# Patient Record
Sex: Male | Born: 1945
Health system: Southern US, Community
[De-identification: ages and names within clinical notes are randomized; demographics above are authoritative.]

## PROBLEM LIST (undated history)

## (undated) DIAGNOSIS — K760 Fatty (change of) liver, not elsewhere classified: Secondary | ICD-10-CM

## (undated) DIAGNOSIS — M722 Plantar fascial fibromatosis: Secondary | ICD-10-CM

## (undated) DIAGNOSIS — I10 Essential (primary) hypertension: Secondary | ICD-10-CM

## (undated) DIAGNOSIS — R748 Abnormal levels of other serum enzymes: Secondary | ICD-10-CM

## (undated) DIAGNOSIS — K8020 Calculus of gallbladder without cholecystitis without obstruction: Secondary | ICD-10-CM

## (undated) DIAGNOSIS — N189 Chronic kidney disease, unspecified: Secondary | ICD-10-CM

## (undated) DIAGNOSIS — E213 Hyperparathyroidism, unspecified: Secondary | ICD-10-CM

## (undated) DIAGNOSIS — G5600 Carpal tunnel syndrome, unspecified upper limb: Secondary | ICD-10-CM

## (undated) DIAGNOSIS — E785 Hyperlipidemia, unspecified: Secondary | ICD-10-CM

## (undated) DIAGNOSIS — I5022 Chronic systolic (congestive) heart failure: Secondary | ICD-10-CM

## (undated) DIAGNOSIS — R7303 Prediabetes: Secondary | ICD-10-CM

## (undated) DIAGNOSIS — I2510 Atherosclerotic heart disease of native coronary artery without angina pectoris: Secondary | ICD-10-CM

## (undated) DIAGNOSIS — I5032 Chronic diastolic (congestive) heart failure: Secondary | ICD-10-CM

## (undated) DIAGNOSIS — K469 Unspecified abdominal hernia without obstruction or gangrene: Secondary | ICD-10-CM

## (undated) DIAGNOSIS — I739 Peripheral vascular disease, unspecified: Secondary | ICD-10-CM

## (undated) DIAGNOSIS — N419 Inflammatory disease of prostate, unspecified: Secondary | ICD-10-CM

## (undated) HISTORY — DX: Carpal tunnel syndrome, unspecified upper limb: G56.00

## (undated) HISTORY — DX: Plantar fascial fibromatosis: M72.2

## (undated) HISTORY — DX: Inflammatory disease of prostate, unspecified: N41.9

## (undated) HISTORY — DX: Chronic diastolic (congestive) heart failure: I50.32

## (undated) HISTORY — DX: Atherosclerotic heart disease of native coronary artery without angina pectoris: I25.10

## (undated) HISTORY — PX: CARPAL TUNNEL RELEASE: SHX101

## (undated) HISTORY — DX: Fatty (change of) liver, not elsewhere classified: K76.0

## (undated) HISTORY — DX: Calculus of gallbladder without cholecystitis without obstruction: K80.20

## (undated) HISTORY — DX: Hyperlipidemia, unspecified: E78.5

## (undated) HISTORY — DX: Essential (primary) hypertension: I10

## (undated) HISTORY — PX: MANDIBLE SURGERY: SHX707

## (undated) HISTORY — DX: Unspecified abdominal hernia without obstruction or gangrene: K46.9

## (undated) HISTORY — PX: HERNIA REPAIR: SHX51

## (undated) HISTORY — DX: Abnormal levels of other serum enzymes: R74.8

## (undated) HISTORY — DX: Hyperparathyroidism, unspecified: E21.3

---

## 1991-06-28 HISTORY — PX: CORONARY ARTERY BYPASS GRAFT: SHX141

## 1998-12-01 ENCOUNTER — Ambulatory Visit (HOSPITAL_BASED_OUTPATIENT_CLINIC_OR_DEPARTMENT_OTHER): Admission: RE | Admit: 1998-12-01 | Discharge: 1998-12-01 | Payer: Self-pay | Admitting: General Surgery

## 2000-06-27 HISTORY — PX: OTHER SURGICAL HISTORY: SHX169

## 2001-06-06 ENCOUNTER — Encounter: Payer: Self-pay | Admitting: Emergency Medicine

## 2001-06-06 ENCOUNTER — Inpatient Hospital Stay (HOSPITAL_COMMUNITY): Admission: EM | Admit: 2001-06-06 | Discharge: 2001-06-08 | Payer: Self-pay | Admitting: Emergency Medicine

## 2001-07-10 ENCOUNTER — Encounter (HOSPITAL_COMMUNITY): Admission: RE | Admit: 2001-07-10 | Discharge: 2001-10-08 | Payer: Self-pay | Admitting: *Deleted

## 2008-06-02 ENCOUNTER — Encounter: Admission: RE | Admit: 2008-06-02 | Discharge: 2008-06-02 | Payer: Self-pay | Admitting: Orthopedic Surgery

## 2008-06-05 ENCOUNTER — Ambulatory Visit (HOSPITAL_BASED_OUTPATIENT_CLINIC_OR_DEPARTMENT_OTHER): Admission: RE | Admit: 2008-06-05 | Discharge: 2008-06-05 | Payer: Self-pay | Admitting: Orthopedic Surgery

## 2008-07-23 ENCOUNTER — Encounter: Admission: RE | Admit: 2008-07-23 | Discharge: 2008-08-18 | Payer: Self-pay | Admitting: Neurology

## 2008-11-25 ENCOUNTER — Ambulatory Visit (HOSPITAL_BASED_OUTPATIENT_CLINIC_OR_DEPARTMENT_OTHER): Admission: RE | Admit: 2008-11-25 | Discharge: 2008-11-25 | Payer: Self-pay | Admitting: Orthopedic Surgery

## 2010-07-12 ENCOUNTER — Emergency Department (HOSPITAL_COMMUNITY)
Admission: EM | Admit: 2010-07-12 | Discharge: 2010-07-12 | Payer: Self-pay | Source: Home / Self Care | Admitting: Emergency Medicine

## 2010-07-14 LAB — BASIC METABOLIC PANEL
BUN: 10 mg/dL (ref 6–23)
CO2: 26 mEq/L (ref 19–32)
Calcium: 10.5 mg/dL (ref 8.4–10.5)
Chloride: 105 mEq/L (ref 96–112)
Creatinine, Ser: 1.02 mg/dL (ref 0.4–1.5)
GFR calc Af Amer: >60 mL/min (ref >60)
GFR calc non Af Amer: >60 mL/min (ref >60)
Glucose, Bld: 118 mg/dL — ABNORMAL HIGH (ref 70–99)
Potassium: 4.2 mEq/L (ref 3.5–5.1)
Sodium: 138 mEq/L (ref 135–145)

## 2010-07-14 LAB — DIFFERENTIAL
Basophils Absolute: 0 10*3/uL (ref 0.0–0.1)
Basophils Relative: 1 % (ref 0–1)
Eosinophils Absolute: 0.4 10*3/uL (ref 0.0–0.7)
Eosinophils Relative: 7 % — ABNORMAL HIGH (ref 0–5)
Lymphocytes Relative: 32 % (ref 12–46)
Lymphs Abs: 1.6 10*3/uL (ref 0.7–4.0)
Monocytes Absolute: 1 10*3/uL (ref 0.1–1.0)
Monocytes Relative: 20 % — ABNORMAL HIGH (ref 3–12)
Neutro Abs: 2 10*3/uL (ref 1.7–7.7)
Neutrophils Relative %: 40 % — ABNORMAL LOW (ref 43–77)

## 2010-07-14 LAB — TROPONIN I: Troponin I: 0.01 ng/mL (ref 0.00–0.06)

## 2010-07-14 LAB — POCT CARDIAC MARKERS
CKMB, poc: 8.7 ng/mL (ref 1.0–8.0)
CKMB, poc: 9.2 ng/mL (ref 1.0–8.0)
CKMB, poc: 7.6 ng/mL (ref 1.0–8.0)
Myoglobin, poc: 411 ng/mL (ref 12–200)
Myoglobin, poc: 427 ng/mL (ref 12–200)
Myoglobin, poc: 315 ng/mL (ref 12–200)
Troponin i, poc: <0.05 ng/mL (ref 0.00–0.09)
Troponin i, poc: <0.05 ng/mL (ref 0.00–0.09)
Troponin i, poc: <0.05 ng/mL (ref 0.00–0.09)

## 2010-07-14 LAB — CBC
HCT: 38.9 % — ABNORMAL LOW (ref 39.0–52.0)
Hemoglobin: 13.4 g/dL (ref 13.0–17.0)
MCH: 33.1 pg (ref 26.0–34.0)
MCHC: 34.4 g/dL (ref 30.0–36.0)
MCV: 96 fL (ref 78.0–100.0)
Platelets: 157 10*3/uL (ref 150–400)
RBC: 4.05 MIL/uL — ABNORMAL LOW (ref 4.22–5.81)
RDW: 13.1 % (ref 11.5–15.5)
WBC: 4.9 10*3/uL (ref 4.0–10.5)

## 2010-07-14 LAB — CK TOTAL AND CKMB (NOT AT ARMC)
CK, MB: 14.1 ng/mL (ref 0.3–4.0)
Relative Index: 1.5 (ref 0.0–2.5)
Total CK: 942 U/L — ABNORMAL HIGH (ref 7–232)

## 2010-10-04 LAB — POCT HEMOGLOBIN-HEMACUE: Hemoglobin: 14.8 g/dL (ref 13.0–17.0)

## 2010-10-05 LAB — BASIC METABOLIC PANEL
BUN: 9 mg/dL (ref 6–23)
CO2: 25 mEq/L (ref 19–32)
Calcium: 10.1 mg/dL (ref 8.4–10.5)
Chloride: 105 mEq/L (ref 96–112)
Creatinine, Ser: 0.91 mg/dL (ref 0.4–1.5)
GFR calc Af Amer: >60 mL/min (ref >60)
GFR calc non Af Amer: >60 mL/min (ref >60)
Glucose, Bld: 174 mg/dL — ABNORMAL HIGH (ref 70–99)
Potassium: 3.8 mEq/L (ref 3.5–5.1)
Sodium: 137 mEq/L (ref 135–145)

## 2010-11-09 NOTE — Op Note (Signed)
NAME:  Stanley [name], Stanley [name]               ACCOUNT NO.:  [id number]   MEDICAL RECORD NO.:  [id number]           PATIENT TYPE:   LOCATION:                                 FACILITY:   PHYSICIAN:  Cindee Salt, M.D.       DATE OF BIRTH:  1945/10/03   DATE OF PROCEDURE:  11/25/2008  DATE OF DISCHARGE:                               OPERATIVE REPORT   PREOPERATIVE DIAGNOSIS:  Carpal tunnel syndrome, right hand.   POSTOPERATIVE DIAGNOSIS:  Carpal tunnel syndrome, right hand.   OPERATION:  Decompression of right median nerve.   SURGEON:  Cindee Salt, MD   ASSISTANT:  Carolyne Fiscal, RN   ANESTHESIA:  General.   DATE OF OPERATION:  November 25, 2008   ANESTHESIOLOGIST:  Janetta Hora. Gelene Mink, MD   HISTORY:  The patient is a 65 year old male with a history of carpal  tunnel syndrome.  EMG nerve conductions positive.  This has not  responded to conservative treatment.  He has elected to undergo surgical  decompression.  Pre and postoperative course have been discussed along  with risks and complications.  He is aware that there is no guarantee  with surgery, possibility of infection, recurrence of injury to  arteries, nerves, tendons, incomplete relief of symptoms, dystrophy.  In  the preoperative area, the patient is seen.  The extremity marked by  both the patient and surgeon.  Antibiotic given.   PROCEDURE:  The patient is brought to the operating room where a general  anesthetic was carried out without difficulty under the direction of Dr.  Gelene Mink.  He was prepped using ChloraPrep, supine position, right arm  free.  A 3-minute dry time was allowed.  A time-out taken confirming the  patient, procedure, he was then draped.  The limb was exsanguinated with  an Esmarch bandage.  Tourniquet placed high and the arm was inflated to  250 mmHg.  A longitudinal incision was made in the palm, carried down  through subcutaneous tissue.  Bleeders were electrocauterized.  Palmar  fascia was split, superficial  palmar arch identified, flexor tendon to  the ring little finger identified to the ulnar side of median nerve.  The carpal retinaculum was incised with sharp dissection.  Right angle  and Sewall retractor were placed between skin and forearm fascia.  The  fascia was released for approximately a centimeter and a half proximal  to the wrist crease under direct vision.  Canal was explored.  Air  compression to the nerve was apparent.  No further lesions were  identified.  The wound was copiously irrigated with saline.  The skin  was then closed with interrupted 5-0 Vicryl Rapide sutures.  Local  infiltration was then given with 0.25% Marcaine  without epinephrine.  A sterile compressive dressing and splint to the  wrist was applied.  The patient tolerated the procedure well and was  taken to the recovery room for observation in satisfactory condition.  He will be discharged home to return to the [facility] in  1 week on Vicodin.  ______________________________  Cindee Salt, M.D.     GK/MEDQ  D:  11/25/2008  T:  11/25/2008  Job:  [id number]   cc:   Dr. Tenny Craw

## 2010-11-09 NOTE — Op Note (Signed)
NAMEBERNARDO, [name]               ACCOUNT NO.:  [id number]   MEDICAL RECORD NO.:  [id number]          PATIENT TYPE:  AMB   LOCATION:  DSC                          FACILITY:  [facility]   PHYSICIAN:  Cindee Salt, M.D.       DATE OF BIRTH:  [birth year]   DATE OF PROCEDURE:  06/05/2008  DATE OF DISCHARGE:                               OPERATIVE REPORT   PREOPERATIVE DIAGNOSIS:  Carpal tunnel syndrome, left hand.   POSTOPERATIVE DIAGNOSIS:  Carpal tunnel syndrome, left hand.   OPERATION:  Decompression of left median nerve.   SURGEON:  Cindee Salt, MD   ASSISTANT:  Carolyne Fiscal, RN   ANESTHESIA:  Forearm-based IV regional.   ANESTHESIOLOGIST:  Zenon Mayo, MD.   HISTORY:  The patient is a 65 year old male with a history of carpal  tunnel syndrome, EMG nerve conductions positive which has not responded  to conservative treatment.  He has elected to undergo surgical  decompression.  Pre, peri, and postoperative course has been discussed  along with risks and complications.  He is aware there is no guarantee  with surgery, possibility of infection, recurrence of injury to  arteries, nerves, and tendons, incomplete relief of symptoms, and  dystrophy.  In the preoperative area, the patient is seen.  The  extremity was marked by both the patient and surgeon.  Antibiotic was  given.   PROCEDURE:  The patient was brought to the operating room where a  forearm-based IV regional anesthetic was carried out without difficulty.  He was prepped using DuraPrep, supine position, left arm free.  A time-  out was taken.  After adequate anesthesia was afforded, a longitudinal  incision was made in the palm and carried down through the subcutaneous  tissue.  Bleeders were electrocauterized.  Palmar fascia was split.  Superficial palmar arch was identified.  The flexor tendon to the ring  and little finger was identified to the ulnar side of the median nerve.  The carpal retinaculum was incised with  sharp dissection.  A right-angle  and Sewall retractor were placed between skin and forearm fascia.  The  fascia was released for approximately a centimeter and half proximal to  the wrist crease under direct vision.  Canal was explored.  Tenosynovial  tissue was moderately thickened.  The area of compression to the nerve  was immediately apparent with an area of hyperemia and hour glass  deformity.  No further lesions were identified.  The wound was copiously  irrigated with saline and skin was closed with interrupted 4-0 Vicryl  Rapide sutures.  A sterile compressive dressing and wrist splint was  applied with the fingers free.  On  deflation of the tourniquet, all fingers immediately pinked.  He was  taken to the recovery room for observation in satisfactory condition.  He would be discharged home to return to the [facility] [place]  in 1 week on Vicodin.           ______________________________  Cindee Salt, M.D.     GK/MEDQ  D:  06/05/2008  T:  06/05/2008  Job:  [id number]  cc:   C. Duane Lope, M.D.

## 2010-11-12 NOTE — Procedures (Signed)
[place]. [facility]  Patient:    Stanley [name], Stanley [name] Visit Number: [id number] MRN: [id number]          Service Type: MED Location: [phone] Attending Physician:  Mora Appl Dictated by:   Darci Needle, M.D. Admit Date:  06/06/2001   CC:         Dellis Anes. Idell Pickles, M.D., [facility]  Meade Maw, M.D.  Cardiac Catheterization Lab   Procedure Report  DATE OF BIRTH:  February 03, 1946.  INDICATION FOR PROCEDURE:  The patient presented with atypical chest pain and nondiagnostic cardiac markers of injury.  A catheterization today was demonstrated to have a high-grade mid right coronary stenosis and percutaneous intervention on this region was felt to be indicated.  PROCEDURE PERFORMED: 1. Percutaneous transluminal coronary angioplasty, mid right coronary artery. 2. Stent, mid right coronary artery.  DESCRIPTION OF PROCEDURE:  After informed consent using the 6-French sheath placed earlier by Dr. Fraser Din, percutaneous coronary intervention was performed.  A total of 3000 units of IV heparin was administered.  ACT was documented to be greater than 250.  Double bolus, followed by an infusion, of Integrilin was given.  A BMW wire, a 6-French vented JR guide catheter, and a 2.5-mm diameter, 15-mm long Maverick balloon was used.  We then stented using a Pixel 13-mm long, 2.5-mm diameter stent to 12 atmospheres which was an effective diameter of 2.6 mm.  The final angiographic result was quite nice without any evidence of complication.  The proximal portion of the RCA contained less than 50% narrowing.  TIMI flow was grade 3 post procedure.  ACT post procedure was 264.  CONCLUSIONS:  Successful percutaneous intervention on the right coronary with reduction in mid vessel 85% stenosis to 0% with stenting and establishment of TIMI grade 3 flow.  PLAN:  Aspirin.  Plavix x three weeks.  Integrilin x 18 hours.  Aggressive risk  factor modification.Dictated by:   Darci Needle, M.D. Attending Physician:  Mora Appl DD:  06/07/01 TD:  06/07/01 Job: [id number]
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
## 2010-11-12 NOTE — Discharge Summary (Signed)
[place]. [facility]  Patient:    Stanley [name], Stanley [name] Visit Number: [id number] MRN: [id number]          Service Type: MED Location: [phone] Attending Physician:  Meade Maw A Dictated by:   Anselm Lis, N.P. Admit Date:  06/06/2001 Discharge Date: 06/08/2001                             Discharge Summary  CONSULTANTS:  Dr. Verdis Prime.  PRIMARY CARE Rossi Burdo:  Dr. Foye Deer.  PROCEDURES: 1. (June 06, 2001) diagnostic coronary angiography by    Dr. Hillary Bow approximately 80-85% mid RCA, 60% CFX (following    OM-2) and approximately 70% ostial OM-1.  Left ventriculogram:  EF 65%    without regional wall motion abnormalities, negative MR. 2. (June 07, 2001) by Dr. Katrinka Blazing:  PTCA/stent, estimated RCA with    obstructive stenosis from 85% to 0%.  DISCHARGE DIAGNOSES: 1. Coronary atherosclerotic heart disease.    a. (June 07, 2001) PTCA/stent RCA.    b. Elevated CK MB without elevation in troponin I.  I suspect falsely       positive versus chronic elevation. 2. Dyslipidemia:  Inadequate control on Welchol. Dictated by:   Anselm Lis, N.P. Attending Physician:  Mora Appl DD:  06/19/01 TD:  06/21/01 Job: [id number]

## 2010-11-12 NOTE — Cardiovascular Report (Signed)
[place]. [facility]  Patient:    Stanley [name], TROOP Visit Number: [id number] MRN: [id number]          Service Type: MED Location: [phone] Attending Physician:  Mora Appl Dictated by:   Meade Maw, M.D. Admit Date:  06/06/2001                          Cardiac Catheterization  REFERRING PHYSICIAN:  Jamesetta Geralds, M.D.  INDICATION FOR PROCEDURE:  Chest pain with positive cardiac enzymes.  DESCRIPTION OF PROCEDURE:  After obtaining written informed consent, the patient was brought to the cardiac catheterization lab in the postabsorptive state.  Preoperative sedation was achieved using IV Versed.  Local anesthesia was achieved using 1% Xylocaine.  A 6-French hemostasis sheath was placed into the right femoral artery using the modified Seldinger technique.  Selective coronary angiography was performed using a JL4 and a 5-French Williams JR catheter.  Multiple views were obtained.  All catheter exchanges were made over a guidewire.  The hemostasis sheath was flushed following each engagement.  Single plane ventriculogram was performed in the RAO position using a 6-French pigtail curved catheter.  FINDINGS:  The aortic pressure is 99/62.  LV pressure is 99/7.  There was no gradient noted on pullback.  There was no mitral regurgitation noted.  Normal ventriculogram, ejection fraction of approximately 60%.  Coronary angiography: 1. The left main coronary artery bifurcates into the left anterior descending    and circumflex vessel.  There was no significant disease in the left main    coronary artery. 2. Left anterior descending:  The left anterior descending gives rise to a    small D-1, small to moderate D-2, and ends as an apical recurrent branch.    The flow in the left anterior descending was noted to be slow but there    were no obstructive lesions noted. 3. Circumflex vessel:  The circumflex vessel is a moderate to large sized    vessel  giving rise to a small to moderate OM-1 and a large bifurcating    OM-2.  There is an ostial 70% lesion in the first OM-1.  Following the    large bifurcating OM-2, there is a 60% lesion noted in the distal    circumflex. 4. Right coronary artery:  The right coronary artery is a small to moderate    sized vessel.  It was noted to have a 40% proximal lesion followed by a    more 60-70% distal lesion.  There was a 30-40% lesion noted prior to the    PDA.  IMPRESSION: 1. Critical disease involving the first obtuse marginal with 60% lesion    following the second obtuse marginal in the circumflex. 2. Borderline critical disease in the mid right coronary artery. 3. Slow flow noted in the left anterior descending but no obstructive lesions    noted. 4. Preserved left ventricular function.  RECOMMENDATIONS:  The film will be reviewed with Dr. Katrinka Blazing.  Further intervention pending his review. Dictated by:   Meade Maw, M.D. Attending Physician:  Mora Appl DD:  06/07/01 TD:  06/07/01 Job: [id number]

## 2011-01-24 ENCOUNTER — Emergency Department (HOSPITAL_COMMUNITY): Payer: Medicare Other

## 2011-01-24 ENCOUNTER — Inpatient Hospital Stay (HOSPITAL_COMMUNITY)
Admission: EM | Admit: 2011-01-24 | Discharge: 2011-01-26 | DRG: 313 | Disposition: A | Discharge status: Home / Self Care | Payer: Medicare Other | Attending: Internal Medicine | Admitting: Internal Medicine

## 2011-01-24 DIAGNOSIS — E785 Hyperlipidemia, unspecified: Secondary | ICD-10-CM | POA: Diagnosis present

## 2011-01-24 DIAGNOSIS — I2510 Atherosclerotic heart disease of native coronary artery without angina pectoris: Secondary | ICD-10-CM | POA: Diagnosis present

## 2011-01-24 DIAGNOSIS — R0789 Other chest pain: Principal | ICD-10-CM | POA: Diagnosis present

## 2011-01-24 DIAGNOSIS — R079 Chest pain, unspecified: Secondary | ICD-10-CM

## 2011-01-24 DIAGNOSIS — I10 Essential (primary) hypertension: Secondary | ICD-10-CM | POA: Diagnosis present

## 2011-01-24 DIAGNOSIS — Z7982 Long term (current) use of aspirin: Secondary | ICD-10-CM

## 2011-01-24 DIAGNOSIS — K219 Gastro-esophageal reflux disease without esophagitis: Secondary | ICD-10-CM | POA: Diagnosis present

## 2011-01-24 DIAGNOSIS — K8020 Calculus of gallbladder without cholecystitis without obstruction: Secondary | ICD-10-CM | POA: Diagnosis present

## 2011-01-24 DIAGNOSIS — E210 Primary hyperparathyroidism: Secondary | ICD-10-CM | POA: Diagnosis present

## 2011-01-24 DIAGNOSIS — K7689 Other specified diseases of liver: Secondary | ICD-10-CM | POA: Diagnosis present

## 2011-01-24 LAB — DIFFERENTIAL
Basophils Absolute: 0 10*3/uL (ref 0.0–0.1)
Basophils Relative: 1 % (ref 0–1)
Eosinophils Absolute: 0.2 10*3/uL (ref 0.0–0.7)
Eosinophils Relative: 3 % (ref 0–5)
Lymphocytes Relative: 34 % (ref 12–46)
Lymphs Abs: 1.7 10*3/uL (ref 0.7–4.0)
Monocytes Absolute: 0.7 10*3/uL (ref 0.1–1.0)
Monocytes Relative: 14 % — ABNORMAL HIGH (ref 3–12)
Neutro Abs: 2.3 10*3/uL (ref 1.7–7.7)
Neutrophils Relative %: 48 % (ref 43–77)

## 2011-01-24 LAB — CBC
HCT: 38.6 % — ABNORMAL LOW (ref 39.0–52.0)
Hemoglobin: 14.1 g/dL (ref 13.0–17.0)
MCH: 34.2 pg — ABNORMAL HIGH (ref 26.0–34.0)
MCHC: 36.5 g/dL — ABNORMAL HIGH (ref 30.0–36.0)
MCV: 93.7 fL (ref 78.0–100.0)
Platelets: 161 10*3/uL (ref 150–400)
RBC: 4.12 MIL/uL — ABNORMAL LOW (ref 4.22–5.81)
RDW: 12.9 % (ref 11.5–15.5)
WBC: 4.8 10*3/uL (ref 4.0–10.5)

## 2011-01-24 LAB — CK TOTAL AND CKMB (NOT AT ARMC)
CK, MB: 13.5 ng/mL (ref 0.3–4.0)
CK, MB: 11.7 ng/mL (ref 0.3–4.0)
Relative Index: 1.1 (ref 0.0–2.5)
Relative Index: 1.1 (ref 0.0–2.5)
Total CK: 1234 U/L — ABNORMAL HIGH (ref 7–232)
Total CK: 1104 U/L — ABNORMAL HIGH (ref 7–232)

## 2011-01-24 LAB — COMPREHENSIVE METABOLIC PANEL
ALT: 51 U/L (ref 0–53)
AST: 54 U/L — ABNORMAL HIGH (ref 0–37)
Albumin: 4.2 g/dL (ref 3.5–5.2)
Alkaline Phosphatase: 70 U/L (ref 39–117)
BUN: 13 mg/dL (ref 6–23)
CO2: 26 mEq/L (ref 19–32)
Calcium: 11 mg/dL — ABNORMAL HIGH (ref 8.4–10.5)
Chloride: 103 mEq/L (ref 96–112)
Creatinine, Ser: 0.73 mg/dL (ref 0.50–1.35)
GFR calc Af Amer: >60 mL/min (ref >60)
GFR calc non Af Amer: >60 mL/min (ref >60)
Glucose, Bld: 143 mg/dL — ABNORMAL HIGH (ref 70–99)
Potassium: 3.7 mEq/L (ref 3.5–5.1)
Sodium: 138 mEq/L (ref 135–145)
Total Bilirubin: 2 mg/dL — ABNORMAL HIGH (ref 0.3–1.2)
Total Protein: 7.3 g/dL (ref 6.0–8.3)

## 2011-01-24 LAB — TROPONIN I
Troponin I: <0.3 ng/mL (ref <0.30)
Troponin I: <0.3 ng/mL (ref <0.30)

## 2011-01-25 ENCOUNTER — Emergency Department (HOSPITAL_COMMUNITY): Payer: Medicare Other

## 2011-01-25 ENCOUNTER — Inpatient Hospital Stay (HOSPITAL_COMMUNITY): Payer: Medicare Other

## 2011-01-25 LAB — CARDIAC PANEL(CRET KIN+CKTOT+MB+TROPI)
CK, MB: 8.5 ng/mL (ref 0.3–4.0)
Relative Index: 1.1 (ref 0.0–2.5)
Total CK: 757 U/L — ABNORMAL HIGH (ref 7–232)
Troponin I: <0.3 ng/mL (ref <0.30)

## 2011-01-25 LAB — BILIRUBIN, FRACTIONATED(TOT/DIR/INDIR)
Bilirubin, Direct: 0.2 mg/dL (ref 0.0–0.3)
Indirect Bilirubin: 1.7 mg/dL — ABNORMAL HIGH (ref 0.3–0.9)
Total Bilirubin: 1.9 mg/dL — ABNORMAL HIGH (ref 0.3–1.2)

## 2011-01-25 LAB — SEDIMENTATION RATE: Sed Rate: 6 mm/hr (ref 0–16)

## 2011-01-25 LAB — LIPID PANEL
LDL Cholesterol: 103 mg/dL — ABNORMAL HIGH (ref 0–99)
VLDL: 30 mg/dL (ref 0–40)

## 2011-01-25 MED ORDER — TECHNETIUM TC 99M TETROFOSMIN IV KIT
30.0000 | PACK | Freq: Once | INTRAVENOUS | Status: AC | PRN
  Administered 2011-01-25: 30 via INTRAVENOUS

## 2011-01-25 MED ORDER — TECHNETIUM TC 99M TETROFOSMIN IV KIT
10.0000 | PACK | Freq: Once | INTRAVENOUS | Status: AC | PRN
  Administered 2011-01-25: 10 via INTRAVENOUS

## 2011-01-26 ENCOUNTER — Other Ambulatory Visit (HOSPITAL_COMMUNITY): Payer: 59

## 2011-01-26 DIAGNOSIS — K8020 Calculus of gallbladder without cholecystitis without obstruction: Secondary | ICD-10-CM

## 2011-01-26 DIAGNOSIS — R079 Chest pain, unspecified: Secondary | ICD-10-CM

## 2011-01-26 LAB — PTH, INTACT AND CALCIUM: PTH: 145.8 pg/mL — ABNORMAL HIGH (ref 14.0–72.0)
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
## 2011-02-09 LAB — PTH-RELATED PEPTIDE

## 2011-02-10 ENCOUNTER — Encounter (INDEPENDENT_AMBULATORY_CARE_PROVIDER_SITE_OTHER): Payer: Self-pay | Admitting: General Surgery

## 2011-02-10 ENCOUNTER — Ambulatory Visit (INDEPENDENT_AMBULATORY_CARE_PROVIDER_SITE_OTHER): Payer: Medicare Other | Admitting: General Surgery

## 2011-02-10 DIAGNOSIS — K8020 Calculus of gallbladder without cholecystitis without obstruction: Secondary | ICD-10-CM | POA: Insufficient documentation

## 2011-02-10 NOTE — Patient Instructions (Signed)
Plan for laparoscopic cholecystectomy

## 2011-02-15 ENCOUNTER — Encounter (INDEPENDENT_AMBULATORY_CARE_PROVIDER_SITE_OTHER): Payer: Self-pay | Admitting: General Surgery

## 2011-02-15 ENCOUNTER — Ambulatory Visit (INDEPENDENT_AMBULATORY_CARE_PROVIDER_SITE_OTHER): Payer: 59 | Admitting: General Surgery

## 2011-02-15 NOTE — Progress Notes (Signed)
Subjective:     Patient ID: Stanley [name], male   DOB: 03-19-1946, 65 y.o.   MRN: [id number]  HPI We are asked to see the patient in consultation by Dr.allen ross. to evaluate for gallstones.The patient is a 65 year old black male who for the last several weeks has been experiencing some epigastric and chest pain. The pain has been associated with some nausea. He denies any fevers or chills. He was evaluated from a cardiac standpoint and his heart checked out okay. As part of his workup he underwent an ultrasound which did show some small stones in his gallbladder but no gallbladder wall thickening or ductal dilatation.  Review of Systems  Constitutional: Negative.   HENT: Negative.   Eyes: Negative.   Respiratory: Negative.   Cardiovascular: Negative.   Gastrointestinal: Negative.   Genitourinary: Negative.   Musculoskeletal: Negative.   Skin: Negative.   Neurological: Negative.   Hematological: Negative.   Psychiatric/Behavioral: Negative.        Objective:   Physical Exam  Constitutional: He is oriented to person, place, and time. He appears well-developed and well-nourished.  HENT:  Head: Normocephalic and atraumatic.  Eyes: Conjunctivae and EOM are normal. Pupils are equal, round, and reactive to light.  Neck: Normal range of motion. Neck supple.  Cardiovascular: Normal rate, regular rhythm and normal heart sounds.   Pulmonary/Chest: Effort normal and breath sounds normal.  Abdominal: Soft. Bowel sounds are normal.       The patient has some mild epigastric tenderness. No palpable mass or hepatosplenomegaly.  Musculoskeletal: Normal range of motion.  Neurological: He is alert and oriented to person, place, and time.  Skin: Skin is warm and dry.  Psychiatric: He has a normal mood and affect. His behavior is normal.       Assessment:     The patient has what sounds like some symptomatic gallstones. Because of the risk of further painful episodes and pancreatitis I think  he would benefit from having his gallbladder removed. He would also like to have this done. I have discussed in detail the risks and benefits of the operation remove the gallbladder as well somewhat technical aspects and he understands and wishes to proceed.    Plan:     Plan for laparoscopic cholecystectomy with intraoperative cholangiogram

## 2011-03-16 ENCOUNTER — Encounter (HOSPITAL_COMMUNITY)
Admission: RE | Admit: 2011-03-16 | Discharge: 2011-03-16 | Disposition: A | Discharge status: Home / Self Care | Payer: Medicare Other | Source: Ambulatory Visit | Attending: General Surgery | Admitting: General Surgery

## 2011-03-16 LAB — CBC
HCT: 36.4 % — ABNORMAL LOW (ref 39.0–52.0)
Hemoglobin: 13.2 g/dL (ref 13.0–17.0)
MCH: 34.7 pg — ABNORMAL HIGH (ref 26.0–34.0)
MCHC: 36.3 g/dL — ABNORMAL HIGH (ref 30.0–36.0)
MCV: 95.8 fL (ref 78.0–100.0)

## 2011-03-16 LAB — COMPREHENSIVE METABOLIC PANEL
Alkaline Phosphatase: 73 U/L (ref 39–117)
BUN: 14 mg/dL (ref 6–23)
Calcium: 11 mg/dL — ABNORMAL HIGH (ref 8.4–10.5)
GFR calc Af Amer: >60 mL/min (ref >60)
Glucose, Bld: 109 mg/dL — ABNORMAL HIGH (ref 70–99)
Potassium: 4.1 mEq/L (ref 3.5–5.1)
Total Protein: 7.3 g/dL (ref 6.0–8.3)

## 2011-03-16 LAB — SURGICAL PCR SCREEN: MRSA, PCR: NEGATIVE

## 2011-03-16 LAB — DIFFERENTIAL
Lymphocytes Relative: 31 % (ref 12–46)
Lymphs Abs: 1.5 10*3/uL (ref 0.7–4.0)
Monocytes Absolute: 0.8 10*3/uL (ref 0.1–1.0)
Monocytes Relative: 17 % — ABNORMAL HIGH (ref 3–12)
Neutro Abs: 2.3 10*3/uL (ref 1.7–7.7)

## 2011-03-17 ENCOUNTER — Telehealth (INDEPENDENT_AMBULATORY_CARE_PROVIDER_SITE_OTHER): Payer: Self-pay | Admitting: General Surgery

## 2011-03-17 NOTE — Telephone Encounter (Signed)
Abnormal labs received from short stay/ cone/ no action per t.o. Dr. Toth/ short stay notified. [phone].

## 2011-03-25 ENCOUNTER — Other Ambulatory Visit (INDEPENDENT_AMBULATORY_CARE_PROVIDER_SITE_OTHER): Payer: Self-pay | Admitting: General Surgery

## 2011-03-25 ENCOUNTER — Ambulatory Visit (HOSPITAL_COMMUNITY): Payer: Medicare Other

## 2011-03-25 ENCOUNTER — Ambulatory Visit (HOSPITAL_COMMUNITY)
Admission: RE | Admit: 2011-03-25 | Discharge: 2011-03-25 | Disposition: A | Discharge status: Home / Self Care | Payer: Medicare Other | Source: Ambulatory Visit | Attending: General Surgery | Admitting: General Surgery

## 2011-03-25 DIAGNOSIS — I2510 Atherosclerotic heart disease of native coronary artery without angina pectoris: Secondary | ICD-10-CM | POA: Insufficient documentation

## 2011-03-25 DIAGNOSIS — K8020 Calculus of gallbladder without cholecystitis without obstruction: Secondary | ICD-10-CM | POA: Insufficient documentation

## 2011-03-25 DIAGNOSIS — Z9861 Coronary angioplasty status: Secondary | ICD-10-CM | POA: Insufficient documentation

## 2011-03-25 DIAGNOSIS — I10 Essential (primary) hypertension: Secondary | ICD-10-CM | POA: Insufficient documentation

## 2011-03-25 DIAGNOSIS — Z01812 Encounter for preprocedural laboratory examination: Secondary | ICD-10-CM | POA: Insufficient documentation

## 2011-03-25 DIAGNOSIS — K8010 Calculus of gallbladder with chronic cholecystitis without obstruction: Secondary | ICD-10-CM

## 2011-03-30 NOTE — Op Note (Signed)
[name], [name]               ACCOUNT NO.:  [id number]  MEDICAL RECORD NO.:  [id number]  LOCATION:  [facility]                         FACILITY:  [facility]  PHYSICIAN:  Ollen Gross. Vernell Morgans, M.D. DATE OF BIRTH:  1945/09/20  DATE OF PROCEDURE:  03/25/2011 DATE OF DISCHARGE:                              OPERATIVE REPORT   PREOPERATIVE DIAGNOSIS:  Gallstones.  POSTOPERATIVE DIAGNOSIS:  Gallstones.  PROCEDURE:  Laparoscopic cholecystectomy with intraoperative cholangiogram.  SURGEON:  Ollen Gross. Vernell Morgans, MD  ASSISTANT:  Gabrielle Dare. Janee Morn, MD  ANESTHESIA:  General endotracheal.  PROCEDURE:  After informed consent was obtained, the patient was brought to the operating room, placed in supine position on the operating room table.  After adequate induction of general anesthesia, the patient's abdomen was prepped with ChloraPrep, allowed to dry, and draped in usual sterile manner.  The site was chosen, the right upper quadrant for placement of an OptiVu 5-mm port.  The area was infiltrated with 0.25% Marcaine.  A small stab incision was made with a 15-blade knife.  The 5- mm OptiVu was then used to dissect through all the layers of the abdominal wall bluntly under direct vision and enter the abdominal cavity.  The abdomen was then insufflated with carbon dioxide without difficulty.  There were some adhesions around his umbilical hernia repair with mesh, but the rest of his abdomen seemed free.  Site more medial to this was used for a 5-mm camera port.  This area was also infiltrated with 0.25% Marcaine.  A small stab incision was made with a 15-blade knife and a 5-mm port was placed bluntly through this incision into the abdominal cavity under direct vision.  A site lateral to this port also was placed in the same manner for another blunt grasper. Next, the epigastric region was infiltrated with 0.25% Marcaine.  A small incision was made with 15-blade knife and a 10-mm port was placed bluntly  through this incision into the abdominal cavity under direct vision.  The laparoscope was then moved to the medial 5 mm port.  Blunt grasper was placed to the lateral-most 5-mm port and used to grasp the dome of the gallbladder and elevated anteriorly and superiorly.  Another blunt grasper was placed through the other 5-mm port and used to retract on the body and neck of the gallbladder.  Dissector was placed through the epigastric port and using the electrocautery.  The peritoneal reflection at the gallbladder neck was opened.  Blunt dissection was carried out in this area until the gallbladder neck cystic duct junction was readily identified and a good window was created.  A single clip was placed on the gallbladder neck.  A small ductotomy was made just below the clip with a laparoscopic scissors.  There appeared to be a stone in the cystic duct that was able to be expressed with blunt dissection.  A 14-gauge Angiocath was then placed percutaneously through the anterior abdominal wall under direct vision.  A Reddick cholangiogram catheter placed through the Angiocath and flushed.  The Reddick catheter was then placed in the cystic duct and anchored in place with the clip. Cholangiogram was obtained that showed no filling defects, good  emptying in the duodenum, and good length on the cystic duct.  Three anchoring clip and catheters were removed from the patient.  Three clips placed proximally in the cystic duct and duct was divided between the two sets of clips.  Posterior to this the cystic artery was identified with a branch.  Each branch was dissected bluntly in a circumferential manner until a good window was created.  Two clips were placed proximally and one distally on each branch and the vessels were divided between the two.  Next, a laparoscopic hook cautery device was used to separate the gallbladder from the liver bed.  Prior to completely detaching the gallbladder from the  liver bed, the liver bed was inspected and several small bleeding points were coagulated with electrocautery until the area was completely hemostatic.  The gallbladder was then detached rest of the way from the liver bed without difficulty.  A laparoscopic bag was then inserted through the epigastric port.  The gallbladder was placed in the bag and the bag was sealed.  The abdomen was then irrigated with copious amounts of saline until the effluent was clear.  The liver bed was inspected again and found to be hemostatic.  The gallbladder and bag were removed with the 10-mm port through the epigastric port without difficulty.  The rest of the ports were then removed under direct vision and found to be hemostatic.  The gas was allowed to escape.  The skin incisions were all closed with interrupted 4-0 Monocryl subcuticular stitches.  Dermabond dressings were applied.  The patient tolerated the procedure well.  At the end of the case, all needle, sponge, and instrument counts were correct.  The patient was then awakened and taken to the recovery room in a stable condition.     Ollen Gross. Vernell Morgans, M.D.     PST/MEDQ  D:  03/25/2011  T:  03/25/2011  Job:  [id number]  Electronically Signed by Chevis Pretty III M.D. on 03/30/2011 12:58:45 PM

## 2011-03-31 LAB — BASIC METABOLIC PANEL
Calcium: 10.8 mg/dL — ABNORMAL HIGH (ref 8.4–10.5)
GFR calc non Af Amer: >60 mL/min (ref >60)
Glucose, Bld: 99 mg/dL (ref 70–99)
Sodium: 139 mEq/L (ref 135–145)

## 2011-04-18 ENCOUNTER — Ambulatory Visit (INDEPENDENT_AMBULATORY_CARE_PROVIDER_SITE_OTHER): Payer: Medicare Other | Admitting: General Surgery

## 2011-04-18 ENCOUNTER — Encounter (INDEPENDENT_AMBULATORY_CARE_PROVIDER_SITE_OTHER): Payer: Self-pay | Admitting: General Surgery

## 2011-04-18 DIAGNOSIS — K8020 Calculus of gallbladder without cholecystitis without obstruction: Secondary | ICD-10-CM

## 2011-04-18 NOTE — Patient Instructions (Signed)
May return to all normal activities 

## 2011-04-18 NOTE — Progress Notes (Signed)
Subjective:     Patient ID: Stanley [name], male   DOB: 12-17-1945, 65 y.o.   MRN: [id number]  HPI The patient is a 65 year old white male who is now about a month out from a laparoscopic cholecystectomy for stones. He has done very well. He has no complaints. His appetite is good and his bowels are working normally. He does notice that there still some foods that don't agree with him. Review of Systems     Objective:   Physical Exam On exam his abdomen is soft and nontender. His incisions are healing nicely.    Assessment:     One month status post laparoscopic cholecystectomy    Plan:     At this point I believe he can return to his normal activities without any restrictions. We'll plan to see him back on a p.r.n. basis

## 2011-12-27 ENCOUNTER — Other Ambulatory Visit: Payer: Self-pay

## 2011-12-27 DIAGNOSIS — M79609 Pain in unspecified limb: Secondary | ICD-10-CM

## 2011-12-27 DIAGNOSIS — I83893 Varicose veins of bilateral lower extremities with other complications: Secondary | ICD-10-CM

## 2012-03-09 ENCOUNTER — Encounter: Payer: Self-pay | Admitting: Surgery

## 2012-03-12 ENCOUNTER — Encounter: Payer: Self-pay | Admitting: Surgery

## 2012-03-12 ENCOUNTER — Encounter (INDEPENDENT_AMBULATORY_CARE_PROVIDER_SITE_OTHER): Payer: Medicare Other | Admitting: *Deleted

## 2012-03-12 ENCOUNTER — Ambulatory Visit (INDEPENDENT_AMBULATORY_CARE_PROVIDER_SITE_OTHER): Payer: Medicare Other | Admitting: Surgery

## 2012-03-12 VITALS — BP 140/65 | HR 73 | Resp 16 | Ht 68.5 in | Wt 221.0 lb

## 2012-03-12 DIAGNOSIS — M79609 Pain in unspecified limb: Secondary | ICD-10-CM

## 2012-03-12 DIAGNOSIS — M7989 Other specified soft tissue disorders: Secondary | ICD-10-CM

## 2012-03-12 DIAGNOSIS — I83893 Varicose veins of bilateral lower extremities with other complications: Secondary | ICD-10-CM
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
## 2012-06-18 ENCOUNTER — Ambulatory Visit: Payer: Medicare Other | Admitting: Vascular Surgery

## 2012-06-22 ENCOUNTER — Encounter: Payer: Self-pay | Admitting: Vascular Surgery

## 2012-06-25 ENCOUNTER — Encounter: Payer: Self-pay | Admitting: Vascular Surgery

## 2012-06-25 ENCOUNTER — Ambulatory Visit (INDEPENDENT_AMBULATORY_CARE_PROVIDER_SITE_OTHER): Payer: Medicare Other | Admitting: Vascular Surgery

## 2012-06-25 VITALS — BP 136/82 | HR 74 | Resp 20 | Ht 68.0 in | Wt 226.0 lb

## 2012-06-25 DIAGNOSIS — M79604 Pain in right leg: Secondary | ICD-10-CM | POA: Insufficient documentation

## 2012-06-25 DIAGNOSIS — I83899 Varicose veins of unspecified lower extremities with other complications: Secondary | ICD-10-CM

## 2012-06-25 DIAGNOSIS — M79609 Pain in unspecified limb: Secondary | ICD-10-CM

## 2012-06-25 DIAGNOSIS — M79605 Pain in left leg: Secondary | ICD-10-CM

## 2012-06-25 DIAGNOSIS — I83893 Varicose veins of bilateral lower extremities with other complications: Secondary | ICD-10-CM | POA: Insufficient documentation

## 2012-06-25 NOTE — Progress Notes (Signed)
Subjective:     Patient ID: Stanley [name], male   DOB: 1946-06-22, 66 y.o.   MRN: [id number]  HPI this 66 year old male returns for further discussion regarding his bilateral leg pain in venous insufficiency. He was seen by Dr. Myra Gianotti 3 months ago. He has been wearing long leg elastic compression stockings and trying elevation and ibuprofen. He has had some improvement in the symptoms which consist aching and throbbing and some small varicosities behind the left knee. He has some other small varicosities bilaterally and some spider veins. He denies much in the way of distal edema until late in the day when he does have some swelling. Stockings have definitely helped his symptoms he states. He has no history of DVT.  Past Medical History  Diagnosis Date  . Hyperlipidemia   . Heart disease   . Hypertension   . Hernia     umbilical  . Glaucoma(365)   . Plantar fasciitis   . Prostate infection   . Carpal tunnel syndrome     bilateral  . CAD (coronary artery disease)     History  Substance Use Topics  . Smoking status: Never Smoker   . Smokeless tobacco: Never Used  . Alcohol Use: No    Family History  Problem Relation Age of Onset  . Heart disease Father     before age 53  . Hyperlipidemia Father   . Hyperlipidemia Mother     No Known Allergies  Current outpatient prescriptions:allopurinol (ZYLOPRIM) 100 MG tablet, Take 100 mg by mouth 2 (two) times daily., Disp: , Rfl: ;  aspirin 325 MG tablet, Take 325 mg by mouth daily.  , Disp: , Rfl: ;  BENICAR 20 MG tablet, daily., Disp: , Rfl: ;  BETIMOL 0.5 % ophthalmic solution, BID times 48H., Disp: , Rfl: ;  FLUTICASONE PROPIONATE NA, Place into the nose as needed., Disp: , Rfl: ;  hydrochlorothiazide 25 MG tablet, daily., Disp: , Rfl:  lansoprazole (PREVACID) 15 MG capsule, Take 15 mg by mouth daily.  , Disp: , Rfl: ;  latanoprost (XALATAN) 0.005 % ophthalmic solution, Place 1 drop into both eyes daily. , Disp: , Rfl: ;  mometasone  (NASONEX) 50 MCG/ACT nasal spray, Place 2 sprays into the nose as needed.  , Disp: , Rfl: ;  Multiple Vitamins-Minerals (CENTRUM CARDIO PO), Take 1 tablet by mouth 2 (two) times daily.  , Disp: , Rfl:  nitroGLYCERIN (NITROLINGUAL) 0.4 MG/SPRAY spray, Place 1 spray under the tongue every 5 (five) minutes as needed.  , Disp: , Rfl: ;  Omega-3 Fatty Acids 1250 MG CAPS, Take by mouth daily. Take 4 tabs q day, Disp: , Rfl: ;  pantoprazole (PROTONIX) 40 MG tablet, BID times 48H., Disp: , Rfl:   BP 136/82  Pulse 74  Resp 20  Ht 5\' 8"  (1.727 m)  Wt 226 lb (102.513 kg)  BMI 34.36 kg/m2  Body mass index is 34.36 kg/(m^2).           Review of Systems denies chest pain, dyspnea on exertion, PND, orthopnea, hemoptysis the     Objective:   Physical Exam blood pressure 136/82 heart rate 74 respirations 20 General well-developed well-nourished male in no apparent stress alert and oriented x3 Lungs no rhonchi or wheezing Left leg with linear varicosities which are reticular vein in size behind the left knee extending laterally. There are some spider veins in both medial thighs. No active ulcers or significant edema distally.  Venous duplex exam at last visit  revealed mild reflux in left great saphenous vein with small caliber pain and evidence of old superficial thrombophlebitis in the left small saphenous vein with no DVT     Assessment:     Venous insufficiency left leg with mild reflux and small caliber great saphenous vein with varicosities behind left knee    Plan:     No indication for laser ablation at this time. Patient would benefit from sclerotherapy of these painful varicosities behind left knee. Will discuss this with him and he is leaning toward proceeding in the near future

## 2012-07-24 ENCOUNTER — Encounter: Payer: Self-pay | Admitting: *Deleted

## 2012-07-25 ENCOUNTER — Ambulatory Visit (INDEPENDENT_AMBULATORY_CARE_PROVIDER_SITE_OTHER): Payer: Medicare Other | Admitting: *Deleted

## 2012-07-25 ENCOUNTER — Ambulatory Visit: Payer: Medicare Other | Admitting: *Deleted

## 2012-07-25 DIAGNOSIS — I781 Nevus, non-neoplastic: Secondary | ICD-10-CM | POA: Insufficient documentation

## 2012-07-25 NOTE — Progress Notes (Signed)
X=.5% Sotradecol administered with a 27g butterfly.  Patient received a total of 6cc.  Treated the veins in the back of his left knee. Tol well. Easy acces for this nice man. Will follow prn.  Photos: yes  Compression stockings applied: yes, ABD and ace used also

## 2013-04-11 ENCOUNTER — Telehealth: Payer: Self-pay | Admitting: Oncology

## 2013-04-11 NOTE — Telephone Encounter (Signed)
S/W PT IN REF TO NP APPT. ON 04/24/13@1 :30 REFERRING DR HAWKES DX LOW PLATELET COUNT MAILED NP PACKET

## 2013-04-11 NOTE — Telephone Encounter (Signed)
C/D 04/11/13 for appt. 04/24/13

## 2013-04-19 ENCOUNTER — Other Ambulatory Visit: Payer: Self-pay | Admitting: Oncology

## 2013-04-19 DIAGNOSIS — D696 Thrombocytopenia, unspecified: Secondary | ICD-10-CM

## 2013-04-23 ENCOUNTER — Telehealth: Payer: Self-pay | Admitting: Oncology

## 2013-04-23 NOTE — Telephone Encounter (Signed)
C/D 04/23/13 for appt. 04/24/13

## 2013-04-24 ENCOUNTER — Other Ambulatory Visit (HOSPITAL_BASED_OUTPATIENT_CLINIC_OR_DEPARTMENT_OTHER): Payer: Medicare Other | Admitting: Lab

## 2013-04-24 ENCOUNTER — Encounter: Payer: Self-pay | Admitting: Oncology

## 2013-04-24 ENCOUNTER — Encounter (INDEPENDENT_AMBULATORY_CARE_PROVIDER_SITE_OTHER): Payer: Self-pay

## 2013-04-24 ENCOUNTER — Ambulatory Visit: Payer: Medicare Other

## 2013-04-24 ENCOUNTER — Ambulatory Visit (HOSPITAL_BASED_OUTPATIENT_CLINIC_OR_DEPARTMENT_OTHER): Payer: Medicare Other | Admitting: Oncology

## 2013-04-24 ENCOUNTER — Telehealth: Payer: Self-pay | Admitting: Oncology

## 2013-04-24 ENCOUNTER — Ambulatory Visit: Payer: Medicare Other | Admitting: Lab

## 2013-04-24 VITALS — BP 139/75 | HR 72 | Temp 97.9°F | Resp 18 | Ht 68.0 in

## 2013-04-24 DIAGNOSIS — D696 Thrombocytopenia, unspecified: Secondary | ICD-10-CM

## 2013-04-24 LAB — CBC WITH DIFFERENTIAL/PLATELET
BASO%: 0.7 % (ref 0.0–2.0)
Basophils Absolute: 0 10*3/uL (ref 0.0–0.1)
Eosinophils Absolute: 0.2 10*3/uL (ref 0.0–0.5)
HGB: 13.8 g/dL (ref 13.0–17.1)
LYMPH%: 26.2 % (ref 14.0–49.0)
MCHC: 35.3 g/dL (ref 32.0–36.0)
MCV: 94.9 fL (ref 79.3–98.0)
MONO%: 11.6 % (ref 0.0–14.0)
NEUT%: 56.6 % (ref 39.0–75.0)
Platelets: 141 10*3/uL (ref 140–400)
RBC: 4.12 10*6/uL — ABNORMAL LOW (ref 4.20–5.82)
WBC: 4.3 10*3/uL (ref 4.0–10.3)
lymph#: 1.1 10*3/uL (ref 0.9–3.3)
nRBC: 0 % (ref 0–0)

## 2013-04-24 LAB — COMPREHENSIVE METABOLIC PANEL (CC13)
ALT: 86 U/L — ABNORMAL HIGH (ref 0–55)
Anion Gap: 7 mEq/L (ref 3–11)
BUN: 12.1 mg/dL (ref 7.0–26.0)
CO2: 24 mEq/L (ref 22–29)
Creatinine: 1 mg/dL (ref 0.7–1.3)
Glucose: 132 mg/dl (ref 70–140)
Potassium: 3.9 mEq/L (ref 3.5–5.1)
Total Bilirubin: 2.35 mg/dL — ABNORMAL HIGH (ref 0.20–1.20)

## 2013-04-24 LAB — CHCC SMEAR

## 2013-04-24 NOTE — Progress Notes (Signed)
Please see consult note.  

## 2013-04-24 NOTE — Progress Notes (Signed)
Checked in new pt with no financial concerns. °

## 2013-04-24 NOTE — Consult Note (Signed)
Reason for Referral: Thrombocytopenia.   HPI: 67 year old gentleman with past medical history significant for coronary artery disease and hypertension who was referred to me for the evaluation of new onset thrombocytopenia. He is a gentleman with the also a history of arthritis and was evaluated by rheumatology and a CBC showed a platelet count of 95,000  On 03/18/2013.his white cell count was 4.9 and hemoglobin of 15 which are all normal. His differential in the white cell count Department of all within normal range. His C-reactive protein was normal and he is electrolytes within normal range with a mildly elevated calcium of 10.9. He had an elevated creatinine kinase 971. A repeat his CBC on 04/03/2013 showed a white cell count of 5.1, hemoglobin of 14.3 and platelet count of 52,000. Histologically he has had normal platelet counts dating back to 2012 where his blood counts 155,000.   Clinically, he is asymptomatic at this point without any evidence of bleeding. He has not reported any headaches blurred vision or double vision. Has not reported any chest pain or difficulty breathing. Has not reported any epistaxis hematochezia or melena. Has not reported any easy bruisability or ecchymosis. Has not reported any petechiae. Has not reported any constitutional symptoms of weight loss or appetite changes. Has not reported any abdominal distention or early satiety. Does not report any musculoskeletal complaints. He does report arthritis pain at times. His continued to be very active and performs activity of daily living without any hindrance or decline.   Past Medical History  Diagnosis Date  . Hyperlipidemia   . Heart disease   . Hypertension   . Hernia     umbilical  . Glaucoma(365)   . Plantar fasciitis   . Prostate infection   . Carpal tunnel syndrome     bilateral  . CAD (coronary artery disease)   :  Past Surgical History  Procedure Laterality Date  . Mandible surgery    . Heart stent   2002  . Hernia repair      umbilical  . Carpal tunnel release      bilateral  . Coronary artery bypass graft  1993  :   Current Outpatient Prescriptions  Medication Sig Dispense Refill  . allopurinol (ZYLOPRIM) 100 MG tablet Take 100 mg by mouth 2 (two) times daily.      [name] aspirin 325 MG tablet Take 325 mg by mouth daily.        [name] BENICAR 20 MG tablet daily.      [name] BETIMOL 0.5 % ophthalmic solution BID times 48H.      [name] FLUTICASONE PROPIONATE NA Place into the nose as needed.      . latanoprost (XALATAN) 0.005 % ophthalmic solution Place 1 drop into both eyes daily.       . mometasone (NASONEX) 50 MCG/ACT nasal spray Place 2 sprays into the nose as needed.        . Multiple Vitamins-Minerals (CENTRUM CARDIO PO) Take 1 tablet by mouth 2 (two) times daily.        . nitroGLYCERIN (NITROLINGUAL) 0.4 MG/SPRAY spray Place 1 spray under the tongue every 5 (five) minutes as needed.        . Omega-3 Fatty Acids 1250 MG CAPS Take by mouth daily. Take 4 tabs q day       No current facility-administered medications for this visit.      No Known Allergies:  Family History  Problem Relation Age of Onset  . Heart disease Father  before age 76  . Hyperlipidemia Father   . Hyperlipidemia Mother   :  History   Social History  . Marital Status: Widowed    Spouse Name: N/A    Number of Children: N/A  . Years of Education: N/A   Occupational History  . Not on file.   Social History Main Topics  . Smoking status: Never Smoker   . Smokeless tobacco: Never Used  . Alcohol Use: No  . Drug Use: No  . Sexual Activity: Not on file   Other Topics Concern  . Not on file   Social History Narrative  . No narrative on file  :  Pertinent items are noted in HPI.  Exam: Blood pressure 139/75, pulse 72, temperature 97.9 F (36.6 C), temperature source Oral, resp. rate 18, height 5\' 8"  (1.727 m), SpO2 98.00%. General appearance: alert, cooperative and appears stated age Head:  Normocephalic, without obvious abnormality, atraumatic Throat: lips, mucosa, and tongue normal; teeth and gums normal Neck: no adenopathy, no carotid bruit, no JVD, supple, symmetrical, trachea midline and thyroid not enlarged, symmetric, no tenderness/mass/nodules Back: symmetric, no curvature. ROM normal. No CVA tenderness. Resp: clear to auscultation bilaterally Cardio: regular rate and rhythm, S1, S2 normal, no murmur, click, rub or gallop GI: soft, non-tender; bowel sounds normal; no masses,  no organomegaly Extremities: extremities normal, atraumatic, no cyanosis or edema Pulses: 2+ and symmetric Skin: Skin color, texture, turgor normal. No rashes or lesions Lymph nodes: Cervical, supraclavicular, and axillary nodes normal.    Recent Labs  04/24/13 1323  NA 142  K 3.9  CO2 24  GLUCOSE 132  BUN 12.1  CREATININE 1.0  CALCIUM 10.7*     Blood smear review:  I have personally reviewed the peripheral smear and appeared to that he has a rather normal complete blood counts. His platelets appeared normal and healthy and certainly clumped. Hemoglobin and white cell count appeared normal. The estimated platelet count was around 130,000.   Assessment and Plan:   67 year old gentleman with a new onset of thrombocytopenia. His platelet count of 2012 was normal over 155,000. His platelet counts dropped down to 95,000 and subsequently to 52,000 in October of 2014. A repeat his CBC today showed his blood counts are above 100,000 and certainly clumped by peripheral smear review. The differential diagnosis of thrombocytopenia was discussed with Stanley [name] today. The most likely etiology that we are dealing with is likely a false result due to platelet clumping. The clumping process caused by the heparin that is present in the collection tube causes the platelets to stick together underestimating what is likely a normal platelet count. To determine the accurate count I will repeat his platelet  count utilizing a citrate based blood collection and that will indeed rule out to the clumping process altogether. If his count is continued to be low despite eliminating clumping, other condition such as autoimmune versus medication will come in to place. I see no reason to suspect other condition such as TTP/HUS, DIC, HIT.  The plan will be at this point is to repeat his blood counts utilizing citrate based preparation and if that is normal then no further followup for evaluation is needed.

## 2013-04-24 NOTE — Telephone Encounter (Signed)
gv adn printed appt sched and avs for pt °

## 2013-04-25 ENCOUNTER — Telehealth: Payer: Self-pay | Admitting: *Deleted

## 2013-04-25 NOTE — Telephone Encounter (Signed)
Lm on home phone for patient to call me.

## 2013-04-25 NOTE — Telephone Encounter (Signed)
Message copied by Reesa Chew on Thu Apr 25, 2013  3:52 PM ------      Message from: Benjiman Core      Created: Thu Apr 25, 2013  2:52 PM       Please call him and let him know that his platelets are normal.        No follow up is needed with [place].  ------

## 2013-04-26 ENCOUNTER — Telehealth: Payer: Self-pay | Admitting: *Deleted

## 2013-04-26 NOTE — Telephone Encounter (Signed)
Message copied by Reesa Chew on Fri Apr 26, 2013  9:18 AM ------      Message from: Benjiman Core      Created: Thu Apr 25, 2013  2:52 PM       Please call him and let him know that his platelets are normal.        No follow up is needed with [place].  ------

## 2013-05-07 ENCOUNTER — Telehealth: Payer: Self-pay | Admitting: *Deleted

## 2013-05-07 NOTE — Telephone Encounter (Signed)
Message copied by Reesa Chew on Tue May 07, 2013 12:17 PM ------      Message from: Benjiman Core      Created: Thu Apr 25, 2013  2:52 PM       Please call him and let him know that his platelets are normal.        No follow up is needed with [place].  ------

## 2013-05-09 ENCOUNTER — Other Ambulatory Visit: Payer: Self-pay | Admitting: Cardiology

## 2013-06-17 ENCOUNTER — Other Ambulatory Visit: Payer: Self-pay | Admitting: Specialist

## 2013-06-17 DIAGNOSIS — M7122 Synovial cyst of popliteal space [Baker], left knee: Secondary | ICD-10-CM

## 2013-06-27 HISTORY — PX: CHOLECYSTECTOMY: SHX55

## 2013-06-30 ENCOUNTER — Other Ambulatory Visit: Payer: Self-pay | Admitting: Cardiology

## 2013-07-02 ENCOUNTER — Other Ambulatory Visit: Payer: Self-pay | Admitting: Specialist

## 2013-07-02 ENCOUNTER — Ambulatory Visit
Admission: RE | Admit: 2013-07-02 | Discharge: 2013-07-02 | Disposition: A | Discharge status: Home / Self Care | Payer: Medicare Other | Source: Ambulatory Visit | Attending: Specialist | Admitting: Specialist

## 2013-07-02 DIAGNOSIS — M7122 Synovial cyst of popliteal space [Baker], left knee: Secondary | ICD-10-CM

## 2013-07-06 ENCOUNTER — Other Ambulatory Visit: Payer: Self-pay | Admitting: Cardiology

## 2013-07-11 ENCOUNTER — Telehealth: Payer: Self-pay | Admitting: Cardiology

## 2013-07-11 MED ORDER — IRBESARTAN 300 MG PO TABS: 300.0000 mg | ORAL_TABLET | Freq: Every day | ORAL | Status: DC

## 2013-07-11 NOTE — Telephone Encounter (Signed)
Avapro 300 mg is generic, and similar potency to Benicar 20 mg qd.  If Dr. Mayford Knifeurner is okay with this, I would recommend patient changing to this, and checking BP daily for the next week and calling results to us.

## 2013-07-11 NOTE — Telephone Encounter (Signed)
I am fine with changing patient to Avapro 300mg  daily and stopping Benicar.  Please have him check his BP daily for a week and call us with the results

## 2013-07-11 NOTE — Telephone Encounter (Signed)
New problem   Pt want to know if there is a less expensive medicine for Bencar 20mg  b/c the bencar is very expensive. Please call pt

## 2013-07-11 NOTE — Telephone Encounter (Signed)
To Jeremy to advise 

## 2013-07-11 NOTE — Telephone Encounter (Signed)
Pt is aware of med change. Rx called into pharmacy for pt.

## 2013-07-11 NOTE — Telephone Encounter (Signed)
TO Dr Turner to advise.  

## 2013-10-16 ENCOUNTER — Encounter: Payer: Self-pay | Admitting: General Surgery

## 2013-10-16 DIAGNOSIS — I2510 Atherosclerotic heart disease of native coronary artery without angina pectoris: Secondary | ICD-10-CM

## 2013-10-16 DIAGNOSIS — I10 Essential (primary) hypertension: Secondary | ICD-10-CM | POA: Insufficient documentation

## 2013-10-16 DIAGNOSIS — Z79899 Other long term (current) drug therapy: Secondary | ICD-10-CM

## 2013-10-16 DIAGNOSIS — E7800 Pure hypercholesterolemia, unspecified: Secondary | ICD-10-CM

## 2013-10-21 ENCOUNTER — Ambulatory Visit: Payer: Medicare Other | Admitting: Cardiology

## 2013-10-22 ENCOUNTER — Encounter: Payer: Self-pay | Admitting: Cardiology

## 2013-10-22 ENCOUNTER — Ambulatory Visit (INDEPENDENT_AMBULATORY_CARE_PROVIDER_SITE_OTHER): Payer: Medicare Other | Admitting: Cardiology

## 2013-10-22 VITALS — BP 150/87 | HR 80 | Wt 229.0 lb

## 2013-10-22 DIAGNOSIS — I2510 Atherosclerotic heart disease of native coronary artery without angina pectoris: Secondary | ICD-10-CM

## 2013-10-22 DIAGNOSIS — E785 Hyperlipidemia, unspecified: Secondary | ICD-10-CM

## 2013-10-22 DIAGNOSIS — I10 Essential (primary) hypertension: Secondary | ICD-10-CM

## 2013-10-22 NOTE — Progress Notes (Signed)
[address] [place]  [address] Phone: [phone] Fax:  [phone]  Date:  10/22/2013   ID:  Stanley [name], DOB 11-Nov-1945, MRN [id number]  PCP:  Stanley AschoffOSS,ALLAN, MD  Cardiologist:  Stanley Magicraci TUrner, MD     History of Present Illness: Stanley [name] is a 68 y.o. 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  He is doing well. He denies any chest pain, SOB, DOE,  dizziness, palpitations or syncope.  He has chronic LE edema which is controlled with compression stockings.  He says that the Irbesartan makes his muscles hurt and give him an upset stomach.     Wt Readings from Last 3 Encounters:  10/22/13 229 lb (103.874 kg)  06/25/12 226 lb (102.513 kg)  03/12/12 221 lb (100.245 kg)     Past Medical History  Diagnosis Date  . Heart disease   . Hernia     umbilical  . Glaucoma   . Plantar fasciitis   . Prostate infection   . Carpal tunnel syndrome     bilateral  . Cholelithiasis   . Normal cardiac stress test 2012    no ischemia  . Elevated CPK     w normal troponin felt due to statin use-muscle bx of L deltoid by rheum in the past, non diagnostic, stable CKs at 7-900 range since 1999  . Hyperparathyroidism   . Hepatic steatosis   . CAD (coronary artery disease)     s/p PCI of left circumflex and RCA  . Hypertension   . Hyperlipidemia     Current Outpatient Prescriptions  Medication Sig Dispense Refill  . allopurinol (ZYLOPRIM) 100 MG tablet Take 200 mg by mouth daily.       [name]. aspirin 325 MG tablet Take 325 mg by mouth daily.        [name]. BETIMOL 0.5 % ophthalmic solution BID times 48H.      [name]. FLUTICASONE PROPIONATE NA Place into the nose as needed.      . irbesartan (AVAPRO) 300 MG tablet Take 1 tablet (300 mg total) by mouth daily.  30 tablet  5  . latanoprost (XALATAN) 0.005 % ophthalmic solution Place 1 drop into both eyes daily.       . Multiple Vitamins-Minerals (CENTRUM CARDIO  PO) Take 1 tablet by mouth 2 (two) times daily.        . nitroGLYCERIN (NITROLINGUAL) 0.4 MG/SPRAY spray Place 1 spray under the tongue every 5 (five) minutes as needed.        . Omega-3 Fatty Acids 1250 MG CAPS Take by mouth daily. Take 4 tabs q day       No current facility-administered medications for this visit.    Allergies:    Allergies  Allergen Reactions  . Meloxicam     Joint aching     Social History:  The patient  reports that he has never smoked. He has never used smokeless tobacco. He reports that he does not drink alcohol or use illicit drugs.   Family History:  The patient's family history includes Heart disease in his father; Hyperlipidemia in his father and mother.   ROS:  Please see the history of present illness.      All other systems reviewed and negative.   PHYSICAL EXAM: VS:  BP 150/87  Pulse 80  Wt 229 lb (103.874 kg) Well nourished, well developed, in no acute distress  HEENT: normal Neck: no JVD Cardiac:  normal S1, S2; RRR; no murmur Lungs:  clear to auscultation bilaterally, no wheezing, rhonchi or rales Abd: soft, nontender, no hepatomegaly Ext: no edema Skin: warm and dry Neuro:  CNs 2-12 intact, no focal abnormalities noted  EKG: NSR with no ST changes      ASSESSMENT AND PLAN:  1. ASCAD with no angina - decrease ASA to 81mg  daily 2. HTN borderline today but at home it runs around 144/5687mmHg - Stop Avapro and start Losartan 100mg  daily - BP check in 1 week with nurse 3. Dyslipidemia - check fasting lipid panel.  Followup with me in  6 months  Signed, Stanley Magicraci Turner, MD 10/22/2013 3:28 PM

## 2013-10-22 NOTE — Patient Instructions (Signed)
Your physician has recommended you make the following change in your medication:   1. Stop Avapro  2. Start Losartan 100 MG 1 tablet daily  Your physician recommends that you return for a FASTING lipid profile: Please schedule the same day you return next week for Nurse visit   Your physician recommends that you schedule a follow-up appointment in: 1 week with Nurse for BP and HR check  Your physician wants you to follow-up in: 6 months with Dr Sherlyn Lickurner You will receive a reminder letter in the mail two months in advance. If you don't receive a letter, please call our office to schedule the follow-up appointment.

## 2013-10-24 ENCOUNTER — Other Ambulatory Visit: Payer: Self-pay

## 2013-10-24 MED ORDER — LOSARTAN POTASSIUM 100 MG PO TABS: 100.0000 mg | ORAL_TABLET | Freq: Every day | ORAL | Status: DC

## 2013-10-28 ENCOUNTER — Other Ambulatory Visit: Payer: Medicare Other

## 2013-10-30 ENCOUNTER — Other Ambulatory Visit: Payer: Self-pay

## 2013-10-30 ENCOUNTER — Ambulatory Visit (INDEPENDENT_AMBULATORY_CARE_PROVIDER_SITE_OTHER): Payer: Medicare Other | Admitting: Nurse Practitioner

## 2013-10-30 ENCOUNTER — Other Ambulatory Visit (INDEPENDENT_AMBULATORY_CARE_PROVIDER_SITE_OTHER): Payer: Medicare Other

## 2013-10-30 VITALS — BP 126/72 | HR 64 | Wt 233.0 lb

## 2013-10-30 DIAGNOSIS — I10 Essential (primary) hypertension: Secondary | ICD-10-CM

## 2013-10-30 DIAGNOSIS — E785 Hyperlipidemia, unspecified: Secondary | ICD-10-CM

## 2013-10-30 LAB — LIPID PANEL
CHOLESTEROL: 218 mg/dL — AB (ref 0–200)
HDL: 36.9 mg/dL — ABNORMAL LOW (ref >39.00)
LDL Cholesterol: 151 mg/dL — ABNORMAL HIGH (ref 0–99)
TRIGLYCERIDES: 149 mg/dL (ref 0.0–149.0)
Total CHOL/HDL Ratio: 6
VLDL: 29.8 mg/dL (ref 0.0–40.0)

## 2013-10-30 LAB — HEPATIC FUNCTION PANEL
ALBUMIN: 3.9 g/dL (ref 3.5–5.2)
ALK PHOS: 60 U/L (ref 39–117)
ALT: 60 U/L — ABNORMAL HIGH (ref 0–53)
AST: 61 U/L — ABNORMAL HIGH (ref 0–37)
Bilirubin, Direct: 0.2 mg/dL (ref 0.0–0.3)
TOTAL PROTEIN: 6.9 g/dL (ref 6.0–8.3)
Total Bilirubin: 2 mg/dL — ABNORMAL HIGH (ref 0.2–1.2)

## 2013-10-30 MED ORDER — ASPIRIN 81 MG PO TABS
81.0000 mg | ORAL_TABLET | Freq: Every day | ORAL | Status: DC
Start: 2013-10-30 — End: 2018-09-28

## 2013-10-30 NOTE — Progress Notes (Addendum)
1.) Reason for visit: Blood pressure check  2.) Name of MD requesting visit: Dr. Mayford Knifeurner  3.) H&P: patient recently started on Losartan 100 mg daily for hypertension  4.) ROS related to problem: patient reports he is feeling well; reports BP checked by home health nurse a few days ago was 120/80, HR 76; patient denies complaints  5.) Assessment and plan per MD: Keep scheduled follow-up and continue same medications  6.) Provider sign-of(MD statement): Dr. Mayford Knifeurner  VS and note reviewed and agree with above  Armanda Magicraci Turner, MD

## 2013-10-30 NOTE — Patient Instructions (Signed)
Your physician recommends that you continue on your current medications as directed. Please refer to the Current Medication list given to you today.  Your physician recommends that keep your scheduled follow-up with Dr. Mayford Knifeurner

## 2014-04-22 ENCOUNTER — Encounter: Payer: Self-pay | Admitting: Cardiology

## 2014-04-22 ENCOUNTER — Ambulatory Visit (INDEPENDENT_AMBULATORY_CARE_PROVIDER_SITE_OTHER): Payer: Medicare Other | Admitting: Cardiology

## 2014-04-22 VITALS — BP 134/84 | HR 86 | Ht 68.5 in | Wt 228.0 lb

## 2014-04-22 DIAGNOSIS — E785 Hyperlipidemia, unspecified: Secondary | ICD-10-CM

## 2014-04-22 DIAGNOSIS — I10 Essential (primary) hypertension: Secondary | ICD-10-CM

## 2014-04-22 DIAGNOSIS — I2584 Coronary atherosclerosis due to calcified coronary lesion: Secondary | ICD-10-CM

## 2014-04-22 DIAGNOSIS — I2510 Atherosclerotic heart disease of native coronary artery without angina pectoris: Secondary | ICD-10-CM

## 2014-04-22 NOTE — Patient Instructions (Addendum)
Your physician wants you to follow-up in: 6 months with Dr. Mayford Knifeurner. You will receive a reminder letter in the mail two months in advance. If you don't receive a letter, please call our office to schedule the follow-up appointment.  Your physician is referring you to the [facility].

## 2014-04-22 NOTE — Progress Notes (Signed)
[address] [place], [place]  [address] Phone: [phone] Fax:  [phone]  Date:  04/22/2014   ID:  [name], DOB [birth year], MRN [id number]  PCP:  Miguel AschoffOSS,ALLAN, MD  Cardiologist:  Armanda Magicraci Turner, MD    History of Present Illness: Stanley [name] is a 68 y.o. 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 He is doing well. He denies any chest pain, SOB, DOE, dizziness, palpitations or syncope. He has chronic LE edema which is controlled with compression stockings.    Wt Readings from Last 3 Encounters:  04/22/14 228 lb (103.42 kg)  10/30/13 233 lb (105.688 kg)  10/22/13 229 lb (103.874 kg)     Past Medical History  Diagnosis Date  . Heart disease   . Hernia     umbilical  . Glaucoma   . Plantar fasciitis   . Prostate infection   . Carpal tunnel syndrome     bilateral  . Cholelithiasis   . Normal cardiac stress test 2012    no ischemia  . Elevated CPK     w normal troponin felt due to statin use-muscle bx of L deltoid by rheum in the past, non diagnostic, stable CKs at 7-900 range since 1999  . Hyperparathyroidism   . Hepatic steatosis   . CAD (coronary artery disease)     s/p PCI of left circumflex and RCA  . Hypertension   . Hyperlipidemia     Current Outpatient Prescriptions  Medication Sig Dispense Refill  . allopurinol (ZYLOPRIM) 100 MG tablet Take 200 mg by mouth daily.       [name]. aspirin 81 MG tablet Take 1 tablet (81 mg total) by mouth daily.      [name]. BOOSTRIX 5-2.5-18.5 injection       . FLUTICASONE PROPIONATE NA Place into the nose as needed.      . latanoprost (XALATAN) 0.005 % ophthalmic solution Place 1 drop into both eyes daily.       [name]. losartan (COZAAR) 100 MG tablet Take 1 tablet (100 mg total) by mouth daily.  90 tablet  3  . methylPREDNIsolone (MEDROL DOSPACK) 4 MG tablet       . Multiple Vitamins-Minerals (CENTRUM CARDIO PO) Take 1 tablet by mouth 2 (two)  times daily.        . nitroGLYCERIN (NITROLINGUAL) 0.4 MG/SPRAY spray Place 1 spray under the tongue every 5 (five) minutes as needed.        . Omega-3 Fatty Acids 1250 MG CAPS Take by mouth daily. Take 4 tabs q day       No current facility-administered medications for this visit.    Allergies:    Allergies  Allergen Reactions  . Meloxicam     Joint aching     Social History:  The patient  reports that he has never smoked. He has never used smokeless tobacco. He reports that he does not drink alcohol or use illicit drugs.   Family History:  The patient's family history includes Heart disease in his father; Hyperlipidemia in his father and mother.   ROS:  Please see the history of present illness.      All other systems reviewed and negative.   PHYSICAL EXAM: VS:  BP 134/84  Pulse 86  Ht 5' 8.5" (1.74 m)  Wt 228 lb (103.42 kg)  BMI 34.16 kg/m2 Well nourished, well developed, in no acute distress HEENT:  normal Neck: no JVD Cardiac:  normal S1, S2; RRR; no murmur Lungs:  clear to auscultation bilaterally, no wheezing, rhonchi or rales Abd: soft, nontender, no hepatomegaly Ext: no edema  ASSESSMENT AND PLAN:  1. ASCAD with no angina - continue ASA 2. HTN borderline today but at home it runs around 144/8087mmHg - Continue Losartan 3. Dyslipidemia - he was supposed to be in a study drug and got a phone call but was never enrolled  Followup with me in 6 months   Signed, Armanda Magicraci Turner, MD [facility] 04/22/2014 4:26 PM

## 2014-04-24 ENCOUNTER — Ambulatory Visit (INDEPENDENT_AMBULATORY_CARE_PROVIDER_SITE_OTHER): Payer: Medicare Other | Admitting: Pharmacist Clinician (PhC)/ Clinical Pharmacy Specialist

## 2014-04-24 ENCOUNTER — Encounter: Payer: Self-pay | Admitting: Pharmacist Clinician (PhC)/ Clinical Pharmacy Specialist

## 2014-04-24 VITALS — Ht 68.5 in | Wt 226.0 lb

## 2014-04-24 DIAGNOSIS — E785 Hyperlipidemia, unspecified: Secondary | ICD-10-CM

## 2014-04-24 LAB — LIPID PANEL
CHOL/HDL RATIO: 5
Cholesterol: 225 mg/dL — ABNORMAL HIGH (ref 0–200)
HDL: 47.8 mg/dL (ref >39.00)
LDL Cholesterol: 157 mg/dL — ABNORMAL HIGH (ref 0–99)
NonHDL: 177.2
TRIGLYCERIDES: 103 mg/dL (ref 0.0–149.0)
VLDL: 20.6 mg/dL (ref 0.0–40.0)

## 2014-04-24 LAB — HEPATIC FUNCTION PANEL
ALT: 63 U/L — ABNORMAL HIGH (ref 0–53)
AST: 45 U/L — ABNORMAL HIGH (ref 0–37)
Albumin: 3.7 g/dL (ref 3.5–5.2)
Alkaline Phosphatase: 77 U/L (ref 39–117)
Bilirubin, Direct: 0.1 mg/dL (ref 0.0–0.3)
Total Bilirubin: 2.1 mg/dL — ABNORMAL HIGH (ref 0.2–1.2)
Total Protein: 7.6 g/dL (ref 6.0–8.3)

## 2014-04-24 NOTE — Patient Instructions (Signed)
Take next dose of Repatha on November 12.   Continue to monitor your diet, watching your carbohydrate intake (white potatoes, breads, pastas)  You will get a call from a specialty pharmacy in 2-3 weeks  Go to lab this morning for repeat cholesterol

## 2014-04-24 NOTE — Progress Notes (Signed)
04/24/2014 Stanley [name] 01-11-1946 [id number]   HPI:  Stanley [name] is a 68 y.o. male patient of Dr Mayford Knifeurner, who presents today for a lipid clinic evaluation.  His history includes PCI of left circumflex and RCA in 2002.  He had an elevated CRP in 03/2012 of 3.4 and low HDL of 37.  We [facility] to get [facility], but his PCI was > 5 years ago.   He has been unable to tolerate all statins, including once weekly dosing.  We will get the data on this [facility].     RF:  PCI, low HDL  Meds: fish oil 4 capsules daily   Intolerant: statins  Family history: pt unsure about exact family history except that both parents had high cholesterol.  Mother died at 482 of "old age" and father at 6556 of "heart issues".    Diet: breakfast - egg, Malaw[place]urkey bacon; lunch/dinner - sandwiches, salads, Lean Cuisine meals. No alcohol Exercise:  No regular Labs:   10/2013 - TC 218,  TG 149, HDL 36.9  LDL 151  2012    - TC 174,  TG 151, HDL 41,  LDL 103      Current Outpatient Prescriptions  Medication Sig Dispense Refill  . allopurinol (ZYLOPRIM) 100 MG tablet Take 200 mg by mouth daily.       [name]. aspirin 81 MG tablet Take 1 tablet (81 mg total) by mouth daily.      [name]. BOOSTRIX 5-2.5-18.5 injection       . FLUTICASONE PROPIONATE NA Place into the nose as needed.      . latanoprost (XALATAN) 0.005 % ophthalmic solution Place 1 drop into both eyes daily.       [name]. losartan (COZAAR) 100 MG tablet Take 1 tablet (100 mg total) by mouth daily.  90 tablet  3  . Multiple Vitamins-Minerals (CENTRUM CARDIO PO) Take 1 tablet by mouth 2 (two) times daily.        . nitroGLYCERIN (NITROLINGUAL) 0.4 MG/SPRAY spray Place 1 spray under the tongue every 5 (five) minutes as needed.        . Omega-3 Fatty Acids 1250 MG CAPS Take by mouth daily. Take 4 tabs q day       No current facility-administered medications for this visit.    Allergies  Allergen Reactions  . Meloxicam     Joint  aching     Past Medical History  Diagnosis Date  . Heart disease   . Hernia     umbilical  . Glaucoma   . Plantar fasciitis   . Prostate infection   . Carpal tunnel syndrome     bilateral  . Cholelithiasis   . Normal cardiac stress test 2012    no ischemia  . Elevated CPK     w normal troponin felt due to statin use-muscle bx of L deltoid by rheum in the past, non diagnostic, stable CKs at 7-900 range since 1999  . Hyperparathyroidism   . Hepatic steatosis   . CAD (coronary artery disease)     s/p PCI of left circumflex and RCA  . Hypertension   . Hyperlipidemia     Height 5' 8.5" (1.74 m), weight 226 lb (102.513 kg).   ASSESSMENT AND PLAN:  Phillips HayKristin Alvstad PharmD CPP [facility]

## 2014-04-24 NOTE — Assessment & Plan Note (Addendum)
Today we started [name] on Repatha 140mg  q 2 weeks.  First dose self administered by patient in office.  Will repeat lipid labs today.  Also have requested office visits and labs [facility] for past several years to better clarify statin intolerance.  Will have on Repatha for 2-3 months then repeat labs and return for visit.

## 2014-05-29 ENCOUNTER — Telehealth: Payer: Self-pay | Admitting: Cardiology

## 2014-05-29 NOTE — Telephone Encounter (Signed)
Spoke with patient and friend.  She has been staying with him for several weeks.  He repeats feeling symptoms of anxiety, lethargy, congestion, poor appetite and 18 pound weight loss.  Last dose Repatha on 11/26 (Thanksgiving Day).  States feels worse now, a week later, than did day after injection.  Advised patient that we can hold next dose until feeling better.  Asked that he wait about 2 weeks, and if feeling better to call us.  We can then, if he is willing, have him do 1 injection and see if reaction returns.  Pt agreeable to plan.

## 2014-05-29 NOTE — Telephone Encounter (Signed)
Pt and his friend staying with him are blaming a trial drug - Repatha - for his new problems. Pt c/o anxiety, lethargy, congestion in chest and head, unstoppable cough, low appetite. Down 18 pounds since trial started.  He is requesting information about how long symptoms will last and what to do.   Forwarding to Textron IncKristin Alvstad to F/U with patient. Also routing to Dr. Mayford Knifeurner for review and recommendations.

## 2014-05-29 NOTE — Telephone Encounter (Signed)
New Message        Pt calling stating he is having an adverse reaction to medication c/o lethargic, sluggish, not active, very anxious, body aches, coughing, dizzy, headaches, racing thoughts all the time especially at night, appetite decreased, lost 18 lbs since 04/24/14. Please call back and advise.

## 2014-05-29 NOTE — Telephone Encounter (Signed)
Sent to PharmD and awaiting instructions to patient about study drug

## 2014-06-05 ENCOUNTER — Telehealth: Payer: Self-pay | Admitting: Pharmacist Clinician (PhC)/ Clinical Pharmacy Specialist

## 2014-06-05 DIAGNOSIS — I10 Essential (primary) hypertension: Secondary | ICD-10-CM

## 2014-06-05 NOTE — Telephone Encounter (Signed)
Pt has been c/o lethargy, increased anxiety, poor appetite and weight loss.  Reviewed with Dr. Mayford Knifeurner, will draw TSH to rule out hyperthyroidism

## 2014-06-06 ENCOUNTER — Other Ambulatory Visit (INDEPENDENT_AMBULATORY_CARE_PROVIDER_SITE_OTHER): Payer: Medicare Other | Admitting: *Deleted

## 2014-06-06 DIAGNOSIS — I2584 Coronary atherosclerosis due to calcified coronary lesion: Secondary | ICD-10-CM

## 2014-06-06 DIAGNOSIS — I2510 Atherosclerotic heart disease of native coronary artery without angina pectoris: Secondary | ICD-10-CM

## 2014-06-06 DIAGNOSIS — E785 Hyperlipidemia, unspecified: Secondary | ICD-10-CM

## 2014-06-06 LAB — TSH: TSH: 1.47 u[IU]/mL (ref 0.35–4.50)

## 2014-06-11 ENCOUNTER — Telehealth: Payer: Self-pay | Admitting: Pharmacist Clinician (PhC)/ Clinical Pharmacy Specialist

## 2014-06-11 NOTE — Telephone Encounter (Signed)
Pt called, states feeling much better, would like to re-try the Repatha.  Returned call, has been 3 weeks since his last dose.  Advised him to take again in the AM and call to let me know how he feels over the 24-48 hours after his dose.  Pt voiced understanding.

## 2014-07-03 ENCOUNTER — Telehealth: Payer: Self-pay | Admitting: Pharmacist Clinician (PhC)/ Clinical Pharmacy Specialist

## 2014-07-03 DIAGNOSIS — E785 Hyperlipidemia, unspecified: Secondary | ICD-10-CM

## 2014-07-03 MED ORDER — EVOLOCUMAB 140 MG/ML ~~LOC~~ SOAJ: 140.0000 mg | SUBCUTANEOUS | Status: DC

## 2014-07-03 NOTE — Telephone Encounter (Signed)
LMOM for patient to return call.  Need to make arrangements for lipid labs now that patient is on Repatha.  Pt to be covered by [employer] for all of calendar year 2016 unless insurance changes.

## 2014-07-11 ENCOUNTER — Other Ambulatory Visit (INDEPENDENT_AMBULATORY_CARE_PROVIDER_SITE_OTHER): Payer: Medicare Other | Admitting: *Deleted

## 2014-07-11 DIAGNOSIS — E785 Hyperlipidemia, unspecified: Secondary | ICD-10-CM

## 2014-07-11 LAB — LIPID PANEL
Cholesterol: 103 mg/dL (ref 0–200)
HDL: 40.9 mg/dL (ref >39.00)
LDL Cholesterol: 45 mg/dL (ref 0–99)
NonHDL: 62.1
Total CHOL/HDL Ratio: 3
Triglycerides: 84 mg/dL (ref 0.0–149.0)
VLDL: 16.8 mg/dL (ref 0.0–40.0)

## 2014-07-11 LAB — HEPATIC FUNCTION PANEL
ALBUMIN: 3.9 g/dL (ref 3.5–5.2)
ALK PHOS: 79 U/L (ref 39–117)
ALT: 44 U/L (ref 0–53)
AST: 44 U/L — ABNORMAL HIGH (ref 0–37)
Bilirubin, Direct: 0.3 mg/dL (ref 0.0–0.3)
Total Bilirubin: 2 mg/dL — ABNORMAL HIGH (ref 0.2–1.2)
Total Protein: 7.3 g/dL (ref 6.0–8.3)

## 2014-07-11 NOTE — Addendum Note (Signed)
Addended by: BOWDEN, ROBIN K on: 07/11/2014 08:55 AM   Modules accepted: Orders  

## 2014-07-11 NOTE — Addendum Note (Signed)
Addended by: Tonita PhoenixBOWDEN, Chirsty Armistead K on: 07/11/2014 08:55 AM   Modules accepted: Orders

## 2014-07-15 ENCOUNTER — Ambulatory Visit (INDEPENDENT_AMBULATORY_CARE_PROVIDER_SITE_OTHER): Payer: Medicare Other | Admitting: Pharmacist Clinician (PhC)/ Clinical Pharmacy Specialist

## 2014-07-15 ENCOUNTER — Encounter: Payer: Self-pay | Admitting: Cardiology

## 2014-07-15 ENCOUNTER — Encounter: Payer: Self-pay | Admitting: Pharmacist Clinician (PhC)/ Clinical Pharmacy Specialist

## 2014-07-15 DIAGNOSIS — E785 Hyperlipidemia, unspecified: Secondary | ICD-10-CM

## 2014-07-15 NOTE — Telephone Encounter (Signed)
This encounter was created in error - please disregard.

## 2014-07-15 NOTE — Patient Instructions (Signed)
Continue with Repatha every other week, next dose on January 28  Medication should come from [facility]  Continue to work on dietary and exercise issues.    Repeat labs on May 2 at 8am  We will call you with results

## 2014-07-15 NOTE — Assessment & Plan Note (Signed)
[name] is thrilled with the decrease in his LDL from 157 to 45.  He has increased his exercise, from nothing to working out at senior center 5 times weekly.  He has improved his diet and lost weight.  At this time he is doing everything right.  I encouraged him to continue with his dietary and exercise plans.  He will be covered on Repatha for all of 2016 thru the [employer] program.  I will repeat lipid and hepatic labs in 4 months.

## 2014-07-15 NOTE — Telephone Encounter (Signed)
New Message  Pt returning Katy's phone call. Please call back and discuss.  

## 2014-07-15 NOTE — Progress Notes (Signed)
07/15/2014 Stanley AzureRonald J Mertens 05/18/1946 [id number]   HPI:  Stanley [name] is a 69 y.o. male patient of Dr Mayford Knifeurner, who presents today for a lipid clinic follow up visit.  His history includes PCI of left circumflex and RCA in 2002.  He had an elevated CRP in 03/2012 of 3.4 and low HDL of 37.  He has been unable to tolerate all statins, including once weekly dosing.  He was started on Repatha in late October.  After 3 doses patient complained of weight loss, decreased appetite, increased anxiety and lethargy.  To be sure it wasn't thyroid related we ran a TSH which came back fine.  Had patient hold his Repatha for an extra week until his symptoms subsided.  Once he felt fine, he was willing to re-challenge, and has done fine with it since.  He now believes he had a viral infection that stuck around for several weeks.   RF:  PCI, low HDL  Meds: fish oil 4 capsules daily, Repatha 140 mg q14d   Intolerant: statins  Family history: pt unsure about exact family history except that both parents had high cholesterol.  Mother died at 3882 of "old age" and father at 5856 of "heart issues".    Diet: breakfast - has improved diet since last visit, eating more green vegetables, drinking more water, has cut out sodas and breads and decreased other high carb foods.  Does not eat out as much as he used to  Exercise:  Joined senior center, now swims twice weekly and does cardio workout three times weekly.   Labs:  06/2014 - TC 103, TG 84, DKD 40.9, LDL 45, nonHDL 62.1 03/2014 - TC 225, TG 103, HDL 47.8, LDL 157, nonHDL 528.4177.2 10/2013 - TC 218,  TG 149, HDL 36.9  LDL 151, nonHDL 132.4181.1 2012    - TC 174,  TG 151, HDL 41,  LDL 103, nonHDL 133     Current Outpatient Prescriptions  Medication Sig Dispense Refill  . allopurinol (ZYLOPRIM) 100 MG tablet Take 200 mg by mouth daily.     [name]. aspirin 81 MG tablet Take 1 tablet (81 mg total) by mouth daily.    [name]. BOOSTRIX 5-2.5-18.5 injection     . Evolocumab (REPATHA SURECLICK)  140 MG/ML SOAJ Inject 140 mg into the skin every 14 (fourteen) days.    [name]. FLUTICASONE PROPIONATE NA Place into the nose as needed.    . latanoprost (XALATAN) 0.005 % ophthalmic solution Place 1 drop into both eyes daily.     [name]. losartan (COZAAR) 100 MG tablet Take 1 tablet (100 mg total) by mouth daily. 90 tablet 3  . Multiple Vitamins-Minerals (CENTRUM CARDIO PO) Take 1 tablet by mouth 2 (two) times daily.      . nitroGLYCERIN (NITROLINGUAL) 0.4 MG/SPRAY spray Place 1 spray under the tongue every 5 (five) minutes as needed.      . Omega-3 Fatty Acids 1250 MG CAPS Take by mouth daily. Take 4 tabs q day     No current facility-administered medications for this visit.    Allergies  Allergen Reactions  . Meloxicam     Joint aching   . Statins Other (See Comments)    Myalgias, even with Crestor once weekly    Past Medical History  Diagnosis Date  . Heart disease   . Hernia     umbilical  . Glaucoma   . Plantar fasciitis   . Prostate infection   . Carpal tunnel syndrome  bilateral  . Cholelithiasis   . Normal cardiac stress test 2012    no ischemia  . Elevated CPK     w normal troponin felt due to statin use-muscle bx of L deltoid by rheum in the past, non diagnostic, stable CKs at 7-900 range since 1999  . Hyperparathyroidism   . Hepatic steatosis   . CAD (coronary artery disease)     s/p PCI of left circumflex and RCA  . Hypertension   . Hyperlipidemia     There were no vitals taken for this visit.   ASSESSMENT AND PLAN:  Phillips Hay PharmD CPP [facility]

## 2014-07-30 ENCOUNTER — Encounter: Payer: Self-pay | Admitting: Cardiology

## 2014-10-16 ENCOUNTER — Other Ambulatory Visit: Payer: Self-pay | Admitting: Cardiovascular Disease

## 2014-10-21 ENCOUNTER — Ambulatory Visit: Payer: Medicare Other | Admitting: Pharmacist

## 2014-10-21 ENCOUNTER — Ambulatory Visit: Payer: Medicare Other | Admitting: Cardiology

## 2014-10-27 ENCOUNTER — Other Ambulatory Visit (INDEPENDENT_AMBULATORY_CARE_PROVIDER_SITE_OTHER): Payer: Medicare Other | Admitting: *Deleted

## 2014-10-27 ENCOUNTER — Encounter: Payer: Self-pay | Admitting: Cardiology

## 2014-10-27 ENCOUNTER — Ambulatory Visit (INDEPENDENT_AMBULATORY_CARE_PROVIDER_SITE_OTHER): Payer: Medicare Other | Admitting: Cardiology

## 2014-10-27 VITALS — BP 120/76 | HR 63 | Ht 68.5 in | Wt 209.2 lb

## 2014-10-27 DIAGNOSIS — I2510 Atherosclerotic heart disease of native coronary artery without angina pectoris: Secondary | ICD-10-CM

## 2014-10-27 DIAGNOSIS — E785 Hyperlipidemia, unspecified: Secondary | ICD-10-CM

## 2014-10-27 DIAGNOSIS — I10 Essential (primary) hypertension: Secondary | ICD-10-CM | POA: Diagnosis not present

## 2014-10-27 LAB — LIPID PANEL
CHOLESTEROL: 114 mg/dL (ref 0–200)
HDL: 39.2 mg/dL (ref >39.00)
LDL CALC: 61 mg/dL (ref 0–99)
NonHDL: 74.8
TRIGLYCERIDES: 70 mg/dL (ref 0.0–149.0)
Total CHOL/HDL Ratio: 3
VLDL: 14 mg/dL (ref 0.0–40.0)

## 2014-10-27 LAB — HEPATIC FUNCTION PANEL
ALK PHOS: 76 U/L (ref 39–117)
ALT: 33 U/L (ref 0–53)
AST: 33 U/L (ref 0–37)
Albumin: 3.8 g/dL (ref 3.5–5.2)
BILIRUBIN DIRECT: 0.4 mg/dL — AB (ref 0.0–0.3)
BILIRUBIN TOTAL: 1.7 mg/dL — AB (ref 0.2–1.2)
Total Protein: 7 g/dL (ref 6.0–8.3)

## 2014-10-27 NOTE — Progress Notes (Signed)
Cardiology Office Note   Date:  10/27/2014   ID:  Thoams, Siefert [birth year], MRN [id number]  PCP:  Donovan Kail, MD    Chief Complaint  Patient presents with  . Coronary Artery Disease  . Hypertension  . Hyperlipidemia      History of Present Illness: Stanley [name] is a 69 y.o. 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 He is doing well. He denies any chest pain, SOB, DOE, dizziness, palpitations or syncope. He has chronic LE edema which is controlled with compression stockings. He swims laps 3 times weekly.     Past Medical History  Diagnosis Date  . Heart disease   . Hernia     umbilical  . Glaucoma   . Plantar fasciitis   . Prostate infection   . Carpal tunnel syndrome     bilateral  . Cholelithiasis   . Normal cardiac stress test 2012    no ischemia  . Elevated CPK     w normal troponin felt due to statin use-muscle bx of L deltoid by rheum in the past, non diagnostic, stable CKs at 7-900 range since 1999  . Hyperparathyroidism   . Hepatic steatosis   . CAD (coronary artery disease)     s/p PCI of left circumflex and RCA  . Hypertension   . Hyperlipidemia     Past Surgical History  Procedure Laterality Date  . Mandible surgery    . Heart stent  2002  . Hernia repair      umbilical  . Carpal tunnel release      bilateral  . Coronary artery bypass graft  1993     Current Outpatient Prescriptions  Medication Sig Dispense Refill  . allopurinol (ZYLOPRIM) 100 MG tablet Take 200 mg by mouth daily.     [name] aspirin 81 MG tablet Take 1 tablet (81 mg total) by mouth daily.    [name] BOOSTRIX 5-2.5-18.5 injection     . Evolocumab (REPATHA SURECLICK) 140 MG/ML SOAJ Inject 140 mg into the skin every 14 (fourteen) days.    . fluticasone (FLONASE) 50 MCG/ACT nasal spray Place 1 spray into both nostrils daily.  2  . latanoprost (XALATAN) 0.005 % ophthalmic solution Place 1 drop into both  eyes daily.     [name] losartan (COZAAR) 100 MG tablet TAKE 1 TABLET BY MOUTH DAILY. 90 tablet 0  . Multiple Vitamins-Minerals (CENTRUM CARDIO PO) Take 1 tablet by mouth 2 (two) times daily.      . nitroGLYCERIN (NITROLINGUAL) 0.4 MG/SPRAY spray Place 1 spray under the tongue every 5 (five) minutes as needed.      . Omega-3 Fatty Acids 1250 MG CAPS Take by mouth daily. Take 4 tabs q day     No current facility-administered medications for this visit.    Allergies:   Meloxicam and Statins    Social History:  The patient  reports that he has never smoked. He has never used smokeless tobacco. He reports that he does not drink alcohol or use illicit drugs.   Family History:  The patient's family history includes Heart disease in his father; Hyperlipidemia in his father and mother.    ROS:  Please see the history of present illness.   Otherwise, review of systems are positive for none.   All other systems are reviewed and negative.    PHYSICAL EXAM: VS:  BP 120/76 mmHg  Pulse 63  Ht 5' 8.5" (1.74 m)  Wt 209 lb 3.2 oz (94.892 kg)  BMI 31.34 kg/m2 , BMI Body mass index is 31.34 kg/(m^2). GEN: Well nourished, well developed, in no acute distress HEENT: normal Neck: no JVD, carotid bruits, or masses Cardiac: RRR; no murmurs, rubs, or gallops,no edema  Respiratory:  clear to auscultation bilaterally, normal work of breathing GI: soft, nontender, nondistended, + BS MS: no deformity or atrophy Skin: warm and dry, no rash Neuro:  Strength and sensation are intact Psych: euthymic mood, full affect   EKG:  EKG is ordered today. The ekg ordered today demonstrates NSR at 63bpm with no ST changes   Recent Labs: 06/06/2014: TSH 1.47 07/11/2014: ALT 44    Lipid Panel    Component Value Date/Time   CHOL 103 07/11/2014 0855   TRIG 84.0 07/11/2014 0855   HDL 40.90 07/11/2014 0855   CHOLHDL 3 07/11/2014 0855   VLDL 16.8 07/11/2014 0855   LDLCALC 45 07/11/2014 0855      Wt Readings from  Last 3 Encounters:  10/27/14 209 lb 3.2 oz (94.892 kg)  04/24/14 226 lb (102.513 kg)  04/22/14 228 lb (103.42 kg)    ASSESSMENT AND PLAN:  1. ASCAD with no angina - continue ASA 2. HTN  - Continue Losartan 3. Dyslipidemia - LDL at goal at 45.  Continue Repatha  Followup with me in 6 months    Current medicines are reviewed at length with the patient today.  The patient does not have concerns regarding medicines.  The following changes have been made:  no change  Labs/ tests ordered today include: see above assessment and plan  Orders Placed This Encounter  Procedures  . EKG 12-Lead     Disposition:   FU with me in 6 months   Signed, Quintella ReichertURNER,TRACI R, MD  10/27/2014 8:56 AM    [facility] [address], [place], [place]  [address] Phone: [phone](336) [phone]; Fax: [phone]

## 2014-10-27 NOTE — Patient Instructions (Signed)
Medication Instructions:  Your physician recommends that you continue on your current medications as directed. Please refer to the Current Medication list given to you today.   Labwork: None  Testing/Procedures: None  Follow-Up: Your physician wants you to follow-up in: 6 months with Dr. Turner. You will receive a reminder letter in the mail two months in advance. If you don't receive a letter, please call our office to schedule the follow-up appointment.  

## 2015-01-13 ENCOUNTER — Other Ambulatory Visit: Payer: Self-pay | Admitting: Cardiology

## 2015-04-16 ENCOUNTER — Other Ambulatory Visit: Payer: Self-pay | Admitting: Cardiology

## 2015-05-13 ENCOUNTER — Ambulatory Visit (INDEPENDENT_AMBULATORY_CARE_PROVIDER_SITE_OTHER): Payer: Medicare Other | Admitting: Cardiology

## 2015-05-13 ENCOUNTER — Encounter: Payer: Self-pay | Admitting: Cardiology

## 2015-05-13 ENCOUNTER — Telehealth: Payer: Self-pay | Admitting: Pharmacist

## 2015-05-13 ENCOUNTER — Telehealth: Payer: Self-pay | Admitting: Cardiology

## 2015-05-13 VITALS — BP 124/74 | HR 72 | Ht 68.0 in | Wt 189.0 lb

## 2015-05-13 DIAGNOSIS — E785 Hyperlipidemia, unspecified: Secondary | ICD-10-CM

## 2015-05-13 DIAGNOSIS — I2510 Atherosclerotic heart disease of native coronary artery without angina pectoris: Secondary | ICD-10-CM

## 2015-05-13 DIAGNOSIS — I10 Essential (primary) hypertension: Secondary | ICD-10-CM

## 2015-05-13 LAB — LIPID PANEL
Cholesterol: 98 mg/dL — ABNORMAL LOW (ref 125–200)
HDL: 41 mg/dL (ref >=40)
LDL CALC: 35 mg/dL (ref <130)
Total CHOL/HDL Ratio: 2.4 Ratio (ref <=5.0)
Triglycerides: 112 mg/dL (ref <150)
VLDL: 22 mg/dL (ref <30)

## 2015-05-13 LAB — HEPATIC FUNCTION PANEL
ALBUMIN: 4.1 g/dL (ref 3.6–5.1)
ALK PHOS: 66 U/L (ref 40–115)
ALT: 45 U/L (ref 9–46)
AST: 45 U/L — ABNORMAL HIGH (ref 10–35)
BILIRUBIN TOTAL: 1.8 mg/dL — AB (ref 0.2–1.2)
Bilirubin, Direct: 0.5 mg/dL — ABNORMAL HIGH (ref <=0.2)
Indirect Bilirubin: 1.3 mg/dL — ABNORMAL HIGH (ref 0.2–1.2)
Total Protein: 7.1 g/dL (ref 6.1–8.1)

## 2015-05-13 NOTE — Progress Notes (Signed)
Cardiology Office Note   Date:  05/13/2015   ID:  Stanley [name], Stanley [name] 10/18/1945, MRN [id number]  PCP:  Donovan Kail, MD    Chief Complaint  Patient presents with  . Coronary Artery Disease  . Hypertension  . Hyperlipidemia      History of Present Illness: Stanley [name] is a 69 y.o. 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 He is doing well. He denies any chest pain, SOB, DOE, dizziness, palpitations or syncope. He has chronic LE edema which is controlled with compression stockings.    Past Medical History  Diagnosis Date  . Heart disease   . Hernia     umbilical  . Glaucoma   . Plantar fasciitis   . Prostate infection   . Carpal tunnel syndrome     bilateral  . Cholelithiasis   . Normal cardiac stress test 2012    no ischemia  . Elevated CPK     w normal troponin felt due to statin use-muscle bx of L deltoid by rheum in the past, non diagnostic, stable CKs at 7-900 range since 1999  . Hyperparathyroidism (HCC)   . Hepatic steatosis   . CAD (coronary artery disease)     s/p PCI of left circumflex and RCA  . Hypertension   . Hyperlipidemia     Past Surgical History  Procedure Laterality Date  . Mandible surgery    . Heart stent  2002  . Hernia repair      umbilical  . Carpal tunnel release      bilateral  . Coronary artery bypass graft  1993     Current Outpatient Prescriptions  Medication Sig Dispense Refill  . allopurinol (ZYLOPRIM) 100 MG tablet Take 200 mg by mouth daily.     [name] aspirin 81 MG tablet Take 1 tablet (81 mg total) by mouth daily.    . Evolocumab (REPATHA SURECLICK) 140 MG/ML SOAJ Inject 140 mg into the skin every 14 (fourteen) days.    . fluticasone (FLONASE) 50 MCG/ACT nasal spray Place 1 spray into both nostrils daily.  2  . latanoprost (XALATAN) 0.005 % ophthalmic solution Place 1 drop into both eyes daily.     [name] losartan (COZAAR) 100 MG  tablet TAKE 1 TABLET BY MOUTH DAILY. 90 tablet 1  . Multiple Vitamins-Minerals (CENTRUM CARDIO PO) Take 1 tablet by mouth 2 (two) times daily.      . Omega 3 1000 MG CAPS Take 1 capsule by mouth 2 (two) times daily.    . nitroGLYCERIN (NITROLINGUAL) 0.4 MG/SPRAY spray Place 1 spray under the tongue every 5 (five) minutes as needed.       No current facility-administered medications for this visit.    Allergies:   Meloxicam and Statins    Social History:  The patient  reports that he has never smoked. He has never used smokeless tobacco. He reports that he does not drink alcohol or use illicit drugs.   Family History:  The patient's family history includes Heart disease in his father; Hyperlipidemia in his father and mother.    ROS:  Please see the history of present illness.   Otherwise, review of systems are positive for none.   All other systems are reviewed and negative.    PHYSICAL EXAM: VS:  BP 124/74 mmHg  Pulse 72  Ht 5\' 8"  (1.727 m)  Wt 85.73 kg (189 lb)  BMI 28.74 kg/m2 , BMI Body mass index is 28.74 kg/(m^2). GEN: Well nourished, well developed, in no acute distress HEENT: normal Neck: no JVD, carotid bruits, or masses Cardiac: RRR; no murmurs, rubs, or gallops,no edema  Respiratory:  clear to auscultation bilaterally, normal work of breathing GI: soft, nontender, nondistended, + BS MS: no deformity or atrophy Skin: warm and dry, no rash Neuro:  Strength and sensation are intact Psych: euthymic mood, full affect   EKG:  EKG was not ordered today.    Recent Labs: 06/06/2014: TSH 1.47 10/27/2014: ALT 33    Lipid Panel    Component Value Date/Time   CHOL 114 10/27/2014 0758   TRIG 70.0 10/27/2014 0758   HDL 39.20 10/27/2014 0758   CHOLHDL 3 10/27/2014 0758   VLDL 14.0 10/27/2014 0758   LDLCALC 61 10/27/2014 0758      Wt Readings from Last 3 Encounters:  05/13/15 85.73 kg (189 lb)  10/27/14 94.892 kg (209 lb 3.2 oz)  04/24/14 102.513 kg (226 lb)      ASSESSMENT AND PLAN:  1. ASCAD with no angina - continue ASA - continue swimming for exercise 2. HTN controlled today  - Continue Losartan 3. Dyslipidemia - currently in Repatha study    Current medicines are reviewed at length with the patient today.  The patient does not have concerns regarding medicines.  The following changes have been made:  no change  Labs/ tests ordered today: See above Assessment and Plan No orders of the defined types were placed in this encounter.     Disposition:   FU with me in 1 year  Signed, Quintella ReichertURNER,Julie-Anne Torain R, MD  05/13/2015 8:11 AM    [facility] [address], [place], [place]  [address] Phone: [phone]) [phone]; Fax: [phone]

## 2015-05-13 NOTE — Telephone Encounter (Signed)
Rechecking lipids and LFTs so that pt can continue to receive Repatha through Repatha Ready program.

## 2015-05-13 NOTE — Telephone Encounter (Signed)
Informed patient of results and verbal understanding expressed.  Forwarded to PCP. Mailed to patient per his request.

## 2015-05-13 NOTE — Addendum Note (Signed)
Addended by: Tonita PhoenixBOWDEN, Safiyyah Vasconez K on: 05/13/2015 08:47 AM   Modules accepted: Orders

## 2015-05-13 NOTE — Patient Instructions (Signed)

## 2015-05-13 NOTE — Telephone Encounter (Signed)
F/u  Pt returning rn phone call concerning lab work

## 2015-05-13 NOTE — Addendum Note (Signed)
Addended by: Joon Pohle K on: 05/13/2015 08:47 AM   Modules accepted: Orders  

## 2015-05-13 NOTE — Telephone Encounter (Signed)
-----   Message from Quintella Reichertraci R Turner, MD sent at 05/13/2015  3:41 PM EST ----- Abnormal LFTs but normal lipids - forward to PCP for further evaluation

## 2015-06-10 ENCOUNTER — Telehealth: Payer: Self-pay | Admitting: Cardiology

## 2015-06-10 NOTE — Telephone Encounter (Signed)
New Message  Rep from Repatha calling to have pt's denial letter for the drug to be faxed [phone].

## 2015-07-20 ENCOUNTER — Telehealth: Payer: Self-pay | Admitting: Pharmacist

## 2015-07-20 MED ORDER — ALIROCUMAB 75 MG/ML ~~LOC~~ SOPN: 75.0000 mg | PEN_INJECTOR | SUBCUTANEOUS | Status: DC

## 2015-07-20 NOTE — Telephone Encounter (Signed)
Pt was enrolled in the Repatha Ready program last year.  Unfortunately he does not qualify any longer.  He has [place] [employer] as insurance and they prefer [employer].  PA approved for Praluent from 07/02/15-01/04/16.  Pt aware of the medication change.  New Rx sent to [facility].

## 2015-07-24 ENCOUNTER — Telehealth: Payer: Self-pay | Admitting: Cardiology

## 2015-07-24 NOTE — Telephone Encounter (Signed)
New message     Talk to Kennon Rounds regarding his praluent.  He cannot afford it.  Please call

## 2015-07-24 NOTE — Telephone Encounter (Signed)
Spoke with pt.  His copay is $400 per month.  Unfortunately the PAN foundation is not accepting new applications at this time so I will place his name on our call list for that.  I did let him know about Low income subsidy opportunities with the social security office.  Gave him the number to call to see if he qualifies for assistance that way.  He will let us know if he is approved for any assistance.

## 2015-10-07 ENCOUNTER — Telehealth: Payer: Self-pay | Admitting: Cardiology

## 2015-10-07 NOTE — Telephone Encounter (Signed)
Stanley [name] is calling requeGregary Signssting additional information in order process the pt's prior authorization for Repatha. He says that update clinical information(not exceeding 60 days) and labs. He says this information can be faxed to [phone] and be sure to include the Attention to [employer] Repatha and the patient's profile # [id number].

## 2015-10-07 NOTE — Telephone Encounter (Signed)
Spoke with Stanley [name] with Repatha Ready.  No need for new PA.  Insurance required switch to Praluent in January and patient has a active PA for Praluent from 07/02/15- 01/04/16.  They will cancel PA for Repatha.

## 2015-10-14 ENCOUNTER — Other Ambulatory Visit: Payer: Self-pay | Admitting: Cardiology

## 2015-10-15 ENCOUNTER — Other Ambulatory Visit: Payer: Self-pay | Admitting: Pharmacist

## 2015-12-04 ENCOUNTER — Emergency Department (HOSPITAL_COMMUNITY): Payer: Medicare Other

## 2015-12-04 ENCOUNTER — Encounter (HOSPITAL_COMMUNITY): Payer: Self-pay | Admitting: Emergency Medicine

## 2015-12-04 ENCOUNTER — Observation Stay (HOSPITAL_COMMUNITY)
Admission: EM | Admit: 2015-12-04 | Discharge: 2015-12-08 | Disposition: A | Discharge status: Home / Self Care | Payer: Medicare Other | Attending: Internal Medicine | Admitting: Internal Medicine

## 2015-12-04 DIAGNOSIS — R778 Other specified abnormalities of plasma proteins: Secondary | ICD-10-CM | POA: Diagnosis present

## 2015-12-04 DIAGNOSIS — R0609 Other forms of dyspnea: Secondary | ICD-10-CM | POA: Diagnosis present

## 2015-12-04 DIAGNOSIS — I2510 Atherosclerotic heart disease of native coronary artery without angina pectoris: Secondary | ICD-10-CM | POA: Insufficient documentation

## 2015-12-04 DIAGNOSIS — I509 Heart failure, unspecified: Secondary | ICD-10-CM

## 2015-12-04 DIAGNOSIS — I5043 Acute on chronic combined systolic (congestive) and diastolic (congestive) heart failure: Secondary | ICD-10-CM | POA: Diagnosis not present

## 2015-12-04 DIAGNOSIS — Z7982 Long term (current) use of aspirin: Secondary | ICD-10-CM | POA: Diagnosis not present

## 2015-12-04 DIAGNOSIS — E876 Hypokalemia: Secondary | ICD-10-CM | POA: Insufficient documentation

## 2015-12-04 DIAGNOSIS — Z955 Presence of coronary angioplasty implant and graft: Secondary | ICD-10-CM | POA: Insufficient documentation

## 2015-12-04 DIAGNOSIS — I110 Hypertensive heart disease with heart failure: Principal | ICD-10-CM | POA: Insufficient documentation

## 2015-12-04 DIAGNOSIS — R7989 Other specified abnormal findings of blood chemistry: Secondary | ICD-10-CM | POA: Diagnosis present

## 2015-12-04 DIAGNOSIS — Z6834 Body mass index (BMI) 34.0-34.9, adult: Secondary | ICD-10-CM | POA: Diagnosis not present

## 2015-12-04 DIAGNOSIS — E669 Obesity, unspecified: Secondary | ICD-10-CM | POA: Diagnosis not present

## 2015-12-04 DIAGNOSIS — E213 Hyperparathyroidism, unspecified: Secondary | ICD-10-CM | POA: Insufficient documentation

## 2015-12-04 DIAGNOSIS — E785 Hyperlipidemia, unspecified: Secondary | ICD-10-CM | POA: Diagnosis not present

## 2015-12-04 DIAGNOSIS — M109 Gout, unspecified: Secondary | ICD-10-CM | POA: Insufficient documentation

## 2015-12-04 DIAGNOSIS — D696 Thrombocytopenia, unspecified: Secondary | ICD-10-CM | POA: Diagnosis not present

## 2015-12-04 DIAGNOSIS — I5033 Acute on chronic diastolic (congestive) heart failure: Secondary | ICD-10-CM | POA: Insufficient documentation

## 2015-12-04 DIAGNOSIS — I5041 Acute combined systolic (congestive) and diastolic (congestive) heart failure: Secondary | ICD-10-CM | POA: Insufficient documentation

## 2015-12-04 LAB — I-STAT TROPONIN, ED: Troponin i, poc: 0.09 ng/mL (ref 0.00–0.08)

## 2015-12-04 LAB — BASIC METABOLIC PANEL
ANION GAP: 5 (ref 5–15)
BUN: 12 mg/dL (ref 6–20)
CO2: 25 mmol/L (ref 22–32)
Calcium: 10.4 mg/dL — ABNORMAL HIGH (ref 8.9–10.3)
Chloride: 110 mmol/L (ref 101–111)
Creatinine, Ser: 1.01 mg/dL (ref 0.61–1.24)
GFR calc Af Amer: >60 mL/min (ref >60)
Glucose, Bld: 164 mg/dL — ABNORMAL HIGH (ref 65–99)
POTASSIUM: 4 mmol/L (ref 3.5–5.1)
Sodium: 140 mmol/L (ref 135–145)

## 2015-12-04 LAB — CBC
HEMATOCRIT: 41.3 % (ref 39.0–52.0)
Hemoglobin: 14.1 g/dL (ref 13.0–17.0)
MCH: 32.3 pg (ref 26.0–34.0)
MCHC: 34.1 g/dL (ref 30.0–36.0)
MCV: 94.7 fL (ref 78.0–100.0)
Platelets: 146 10*3/uL — ABNORMAL LOW (ref 150–400)
RBC: 4.36 MIL/uL (ref 4.22–5.81)
RDW: 13.2 % (ref 11.5–15.5)
WBC: 4.4 10*3/uL (ref 4.0–10.5)
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
## 2015-12-04 NOTE — ED Notes (Signed)
Pt. Reports worsening exertional dyspnea with dry cough onset this week , denies fever or chills. Seen [facility] advised pt. to go to ER for further evaluation.

## 2015-12-05 ENCOUNTER — Observation Stay (HOSPITAL_BASED_OUTPATIENT_CLINIC_OR_DEPARTMENT_OTHER): Payer: Medicare Other

## 2015-12-05 DIAGNOSIS — I10 Essential (primary) hypertension: Secondary | ICD-10-CM | POA: Diagnosis not present

## 2015-12-05 DIAGNOSIS — I2510 Atherosclerotic heart disease of native coronary artery without angina pectoris: Secondary | ICD-10-CM

## 2015-12-05 DIAGNOSIS — E785 Hyperlipidemia, unspecified: Secondary | ICD-10-CM

## 2015-12-05 DIAGNOSIS — R7989 Other specified abnormal findings of blood chemistry: Secondary | ICD-10-CM | POA: Diagnosis not present

## 2015-12-05 DIAGNOSIS — I509 Heart failure, unspecified: Secondary | ICD-10-CM

## 2015-12-05 DIAGNOSIS — I5033 Acute on chronic diastolic (congestive) heart failure: Secondary | ICD-10-CM

## 2015-12-05 DIAGNOSIS — R778 Other specified abnormalities of plasma proteins: Secondary | ICD-10-CM | POA: Diagnosis present

## 2015-12-05 LAB — CBC WITH DIFFERENTIAL/PLATELET
Basophils Absolute: 0 10*3/uL (ref 0.0–0.1)
Basophils Relative: 0 %
EOS ABS: 0.2 10*3/uL (ref 0.0–0.7)
EOS PCT: 5 %
HCT: 45.7 % (ref 39.0–52.0)
HEMOGLOBIN: 15.7 g/dL (ref 13.0–17.0)
LYMPHS ABS: 1.6 10*3/uL (ref 0.7–4.0)
Lymphocytes Relative: 34 %
MCH: 32.8 pg (ref 26.0–34.0)
MCHC: 34.4 g/dL (ref 30.0–36.0)
MCV: 95.6 fL (ref 78.0–100.0)
MONO ABS: 0.6 10*3/uL (ref 0.1–1.0)
MONOS PCT: 13 %
NEUTROS PCT: 48 %
Neutro Abs: 2.2 10*3/uL (ref 1.7–7.7)
Platelets: 162 10*3/uL (ref 150–400)
RBC: 4.78 MIL/uL (ref 4.22–5.81)
RDW: 13.6 % (ref 11.5–15.5)
WBC: 4.6 10*3/uL (ref 4.0–10.5)

## 2015-12-05 LAB — ECHOCARDIOGRAM COMPLETE
AVPHT: 762 ms
E/e' ratio: 10.01
EWDT: 187 ms
FS: 27 % — AB (ref 28–44)
Height: 66.5 in
IVS/LV PW RATIO, ED: 0.78
LA ID, A-P, ES: 39 mm
LA diam end sys: 39 mm
LA diam index: 1.86 cm/m2
LA vol index: 24.9 mL/m2
LA vol: 52.2 mL
LAVOLA4C: 51 mL
LV E/e'average: 10.01
LV TDI E'LATERAL: 6.68
LV e' LATERAL: 6.68 cm/s
LV sys vol index: 15 mL/m2
LVDIAVOL: 76 mL (ref 62–150)
LVDIAVOLIN: 36 mL/m2
LVEEMED: 10.01
LVOT area: 3.46 cm2
LVOT diameter: 21 mm
LVSYSVOL: 31 mL (ref 21–61)
MRPISAEROA: 0.12 cm2
MV Dec: 187
MV pk E vel: 66.9 m/s
PV Reg vel dias: 65.2 cm/s
PW: 13.1 mm — AB (ref 0.6–1.1)
RV TAPSE: 16.7 mm
Simpson's disk: 59
Stroke v: 45 ml
TDI e' medial: 4.2
VTI: 126 cm
WEIGHTICAEL: 3558.4 [oz_av]

## 2015-12-05 LAB — TROPONIN I
Troponin I: 0.09 ng/mL — ABNORMAL HIGH (ref <0.031)
Troponin I: 0.09 ng/mL — ABNORMAL HIGH (ref <0.031)
Troponin I: 0.09 ng/mL — ABNORMAL HIGH (ref <0.031)

## 2015-12-05 LAB — LIPID PANEL
CHOL/HDL RATIO: 8.4 ratio
CHOLESTEROL: 251 mg/dL — AB (ref 0–200)
HDL: 30 mg/dL — ABNORMAL LOW (ref >40)
LDL CALC: 199 mg/dL — AB (ref 0–99)
TRIGLYCERIDES: 110 mg/dL (ref <150)
VLDL: 22 mg/dL (ref 0–40)

## 2015-12-05 LAB — BRAIN NATRIURETIC PEPTIDE: B Natriuretic Peptide: 264.3 pg/mL — ABNORMAL HIGH (ref 0.0–100.0)

## 2015-12-05 MED ORDER — LOSARTAN POTASSIUM 50 MG PO TABS
100.0000 mg | ORAL_TABLET | Freq: Every day | ORAL | Status: DC
  Administered 2015-12-05 – 2015-12-08 (×4): 100 mg via ORAL
  Filled 2015-12-05 (×4): qty 2

## 2015-12-05 MED ORDER — SODIUM CHLORIDE 0.9% FLUSH: 3.0000 mL | INTRAVENOUS | Status: DC | PRN

## 2015-12-05 MED ORDER — ALLOPURINOL 100 MG PO TABS
200.0000 mg | ORAL_TABLET | Freq: Every day | ORAL | Status: DC
  Administered 2015-12-05 – 2015-12-08 (×4): 200 mg via ORAL
  Filled 2015-12-05 (×4): qty 2

## 2015-12-05 MED ORDER — ONDANSETRON HCL 4 MG/2ML IJ SOLN: 4.0000 mg | Freq: Four times a day (QID) | INTRAMUSCULAR | Status: DC | PRN

## 2015-12-05 MED ORDER — ACETAMINOPHEN 325 MG PO TABS: 650.0000 mg | ORAL_TABLET | ORAL | Status: DC | PRN

## 2015-12-05 MED ORDER — ENOXAPARIN SODIUM 40 MG/0.4ML ~~LOC~~ SOLN
40.0000 mg | Freq: Every day | SUBCUTANEOUS | Status: DC
  Administered 2015-12-05 – 2015-12-07 (×3): 40 mg via SUBCUTANEOUS
  Filled 2015-12-05 (×3): qty 0.4

## 2015-12-05 MED ORDER — OMEGA-3-ACID ETHYL ESTERS 1 G PO CAPS
1000.0000 mg | ORAL_CAPSULE | Freq: Every day | ORAL | Status: DC
  Administered 2015-12-05 – 2015-12-07 (×3): 1000 mg via ORAL
  Filled 2015-12-05 (×3): qty 1

## 2015-12-05 MED ORDER — ADULT MULTIVITAMIN W/MINERALS CH
1.0000 | ORAL_TABLET | Freq: Every day | ORAL | Status: DC
  Administered 2015-12-05 – 2015-12-07 (×3): 1 via ORAL
  Filled 2015-12-05 (×3): qty 1

## 2015-12-05 MED ORDER — FUROSEMIDE 10 MG/ML IJ SOLN
40.0000 mg | INTRAMUSCULAR | Status: AC
  Administered 2015-12-05: 40 mg via INTRAVENOUS
  Filled 2015-12-05: qty 4

## 2015-12-05 MED ORDER — ASPIRIN EC 81 MG PO TBEC
81.0000 mg | DELAYED_RELEASE_TABLET | Freq: Every day | ORAL | Status: DC
  Administered 2015-12-05 – 2015-12-08 (×4): 81 mg via ORAL
  Filled 2015-12-05 (×4): qty 1

## 2015-12-05 MED ORDER — FUROSEMIDE 10 MG/ML IJ SOLN
40.0000 mg | Freq: Two times a day (BID) | INTRAMUSCULAR | Status: DC
  Administered 2015-12-05 – 2015-12-06 (×4): 40 mg via INTRAVENOUS
  Filled 2015-12-05 (×4): qty 4

## 2015-12-05 MED ORDER — FLUTICASONE PROPIONATE 50 MCG/ACT NA SUSP: 1.0000 | Freq: Every day | NASAL | Status: DC | PRN

## 2015-12-05 MED ORDER — SODIUM CHLORIDE 0.9 % IV SOLN: 250.0000 mL | INTRAVENOUS | Status: DC | PRN

## 2015-12-05 MED ORDER — SODIUM CHLORIDE 0.9% FLUSH
3.0000 mL | Freq: Two times a day (BID) | INTRAVENOUS | Status: DC
  Administered 2015-12-05 – 2015-12-08 (×7): 3 mL via INTRAVENOUS

## 2015-12-05 MED ORDER — ASPIRIN 81 MG PO CHEW
324.0000 mg | CHEWABLE_TABLET | Freq: Once | ORAL | Status: AC
  Administered 2015-12-05: 324 mg via ORAL
  Filled 2015-12-05: qty 4

## 2015-12-05 MED ORDER — LATANOPROST 0.005 % OP SOLN
1.0000 [drp] | Freq: Every day | OPHTHALMIC | Status: DC
Start: 2015-12-05 — End: 2015-12-08
  Administered 2015-12-05 – 2015-12-07 (×3): 1 [drp] via OPHTHALMIC
  Filled 2015-12-05: qty 2.5

## 2015-12-05 NOTE — Progress Notes (Signed)
Echocardiogram 2D Echocardiogram has been performed.  Marisue Humblelexis N Amorita Vanrossum 12/05/2015, 10:16 AM
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
## 2015-12-05 NOTE — ED Notes (Signed)
Dr. Clarene DukeLittle aware of troponin level

## 2015-12-05 NOTE — Progress Notes (Signed)
Pt admitted after midnight, please see earlier admission note by Dr. Katrinka BlazingSmith. Pt seen and examined at bedside, still with dyspnea mostly with exertion but pt says he feels better. His VS are stable and blood work stable on admission, no BMP this AM. troponins mildly elevated but remain flat. Cardiology was consulted. ECHO completed and with EF 55 - 60%, normal systolic function but with elevated ventricular diastolic filling pressures. Keep on tele for now, continue Lasix. BMP in AM. Follow up on cardiology recommendations.   Debbora PrestoMAGICK-Kinslei Labine, MD  [facility] Pager [phone]  If 7PM-7AM, please contact night-coverage [email or web address] Password [email or web address]

## 2015-12-05 NOTE — ED Notes (Signed)
Attempted to call report

## 2015-12-05 NOTE — Progress Notes (Signed)
   12/05/15 0308  Vitals  Temp 97.5 F (36.4 C)  Temp Source Oral  BP 121/76 mmHg  BP Location Left Arm  BP Method Automatic  Patient Position (if appropriate) Lying  Pulse Rate 70  Pulse Rate Source Dinamap  Resp (!) 21  Oxygen Therapy  SpO2 98 %  O2 Device Room Air  Height and Weight  Height 5' 6.5" (1.689 m)  Weight 100.88 kg (222 lb 6.4 oz) (scale a)  Type of Scale Used Standing  Type of Weight Actual  BSA (Calculated - sq m) 2.18 sq meters  BMI (Calculated) 35.4  Weight in (lb) to have BMI = 25 156.9  Admitted pt to rm 3E09 from ED, pt alert and oriented, denied pain at this time, oriented to room, call bell placed within reach. CCMD made aware.

## 2015-12-06 DIAGNOSIS — R7989 Other specified abnormal findings of blood chemistry: Secondary | ICD-10-CM

## 2015-12-06 DIAGNOSIS — I5031 Acute diastolic (congestive) heart failure: Secondary | ICD-10-CM | POA: Diagnosis not present

## 2015-12-06 DIAGNOSIS — I10 Essential (primary) hypertension: Secondary | ICD-10-CM

## 2015-12-06 DIAGNOSIS — I5041 Acute combined systolic (congestive) and diastolic (congestive) heart failure: Secondary | ICD-10-CM | POA: Diagnosis not present

## 2015-12-06 DIAGNOSIS — R0609 Other forms of dyspnea: Secondary | ICD-10-CM | POA: Diagnosis not present

## 2015-12-06 DIAGNOSIS — D696 Thrombocytopenia, unspecified: Secondary | ICD-10-CM

## 2015-12-06 DIAGNOSIS — I2510 Atherosclerotic heart disease of native coronary artery without angina pectoris: Secondary | ICD-10-CM | POA: Diagnosis not present

## 2015-12-06 DIAGNOSIS — I5033 Acute on chronic diastolic (congestive) heart failure: Secondary | ICD-10-CM | POA: Diagnosis not present

## 2015-12-06 LAB — BASIC METABOLIC PANEL
ANION GAP: 6 (ref 5–15)
BUN: 16 mg/dL (ref 6–20)
CALCIUM: 10.9 mg/dL — AB (ref 8.9–10.3)
CO2: 28 mmol/L (ref 22–32)
Chloride: 105 mmol/L (ref 101–111)
Creatinine, Ser: 1.02 mg/dL (ref 0.61–1.24)
GFR calc Af Amer: >60 mL/min (ref >60)
GFR calc non Af Amer: >60 mL/min (ref >60)
GLUCOSE: 137 mg/dL — AB (ref 65–99)
Potassium: 3.2 mmol/L — ABNORMAL LOW (ref 3.5–5.1)
Sodium: 139 mmol/L (ref 135–145)

## 2015-12-06 MED ORDER — REGADENOSON 0.4 MG/5ML IV SOLN
0.4000 mg | Freq: Once | INTRAVENOUS | Status: AC
  Administered 2015-12-07: 0.4 mg via INTRAVENOUS
  Filled 2015-12-06: qty 5

## 2015-12-06 MED ORDER — POTASSIUM CHLORIDE CRYS ER 20 MEQ PO TBCR
40.0000 meq | EXTENDED_RELEASE_TABLET | Freq: Once | ORAL | Status: AC
  Administered 2015-12-06: 40 meq via ORAL
  Filled 2015-12-06: qty 2

## 2015-12-06 MED ORDER — POTASSIUM CHLORIDE CRYS ER 20 MEQ PO TBCR
30.0000 meq | EXTENDED_RELEASE_TABLET | Freq: Once | ORAL | Status: AC
  Administered 2015-12-06: 30 meq via ORAL
  Filled 2015-12-06: qty 1

## 2015-12-06 NOTE — Progress Notes (Signed)
For case manager:  Please review if pt meets inpatient criteria. Pt admitted with acute diastolic CHF, requiring IV Lasix, plan for myoview in AM.   Debbora PrestoMAGICK-MYERS, ISKRA, MD  [facility] Pager [phone]  If 7PM-7AM, please contact night-coverage [email or web address] Password [email or web address]
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
## 2015-12-07 ENCOUNTER — Observation Stay (HOSPITAL_COMMUNITY): Payer: Medicare Other

## 2015-12-07 DIAGNOSIS — I5041 Acute combined systolic (congestive) and diastolic (congestive) heart failure: Secondary | ICD-10-CM | POA: Diagnosis not present

## 2015-12-07 DIAGNOSIS — I509 Heart failure, unspecified: Secondary | ICD-10-CM | POA: Diagnosis not present

## 2015-12-07 DIAGNOSIS — R0609 Other forms of dyspnea: Secondary | ICD-10-CM | POA: Diagnosis not present

## 2015-12-07 DIAGNOSIS — I5033 Acute on chronic diastolic (congestive) heart failure: Secondary | ICD-10-CM | POA: Insufficient documentation

## 2015-12-07 DIAGNOSIS — R7989 Other specified abnormal findings of blood chemistry: Secondary | ICD-10-CM | POA: Diagnosis not present

## 2015-12-07 LAB — CBC
HEMATOCRIT: 44.6 % (ref 39.0–52.0)
Hemoglobin: 15.2 g/dL (ref 13.0–17.0)
MCH: 32.3 pg (ref 26.0–34.0)
MCHC: 34.1 g/dL (ref 30.0–36.0)
MCV: 94.7 fL (ref 78.0–100.0)
PLATELETS: 168 10*3/uL (ref 150–400)
RBC: 4.71 MIL/uL (ref 4.22–5.81)
RDW: 13.2 % (ref 11.5–15.5)
WBC: 4.1 10*3/uL (ref 4.0–10.5)

## 2015-12-07 LAB — HEMOGLOBIN A1C
HEMOGLOBIN A1C: 5.8 % — AB (ref 4.8–5.6)
Mean Plasma Glucose: 120 mg/dL

## 2015-12-07 LAB — NM MYOCAR MULTI W/SPECT W/WALL MOTION / EF
CHL CUP MPHR: 150 {beats}/min
CHL CUP RESTING HR STRESS: 70 {beats}/min
CSEPED: 0 min
CSEPEW: 1 METS
CSEPHR: 62 %
Exercise duration (sec): 0 s
Peak HR: 93 {beats}/min

## 2015-12-07 LAB — BASIC METABOLIC PANEL
Anion gap: 8 (ref 5–15)
BUN: 19 mg/dL (ref 6–20)
CALCIUM: 11.2 mg/dL — AB (ref 8.9–10.3)
CO2: 27 mmol/L (ref 22–32)
CREATININE: 1.12 mg/dL (ref 0.61–1.24)
Chloride: 104 mmol/L (ref 101–111)
GFR calc non Af Amer: >60 mL/min (ref >60)
Glucose, Bld: 121 mg/dL — ABNORMAL HIGH (ref 65–99)
Potassium: 3.9 mmol/L (ref 3.5–5.1)
SODIUM: 139 mmol/L (ref 135–145)

## 2015-12-07 MED ORDER — FUROSEMIDE 10 MG/ML IJ SOLN
40.0000 mg | Freq: Once | INTRAMUSCULAR | Status: AC
  Administered 2015-12-07: 40 mg via INTRAVENOUS
  Filled 2015-12-07: qty 4

## 2015-12-07 MED ORDER — REGADENOSON 0.4 MG/5ML IV SOLN
0.4000 mg | Freq: Once | INTRAVENOUS | Status: AC
  Administered 2015-12-07: 0.4 mg via INTRAVENOUS
  Filled 2015-12-07: qty 5

## 2015-12-07 MED ORDER — TECHNETIUM TC 99M TETROFOSMIN IV KIT
10.0000 | PACK | Freq: Once | INTRAVENOUS | Status: AC | PRN
  Administered 2015-12-07: 10 via INTRAVENOUS

## 2015-12-07 MED ORDER — TECHNETIUM TC 99M TETROFOSMIN IV KIT
30.0000 | PACK | Freq: Once | INTRAVENOUS | Status: AC | PRN
  Administered 2015-12-07: 30 via INTRAVENOUS

## 2015-12-07 MED ORDER — REGADENOSON 0.4 MG/5ML IV SOLN
INTRAVENOUS | Status: AC
  Administered 2015-12-07: 09:00:00
  Filled 2015-12-07: qty 5

## 2015-12-07 MED ORDER — FUROSEMIDE 40 MG PO TABS
40.0000 mg | ORAL_TABLET | Freq: Every day | ORAL | Status: DC
  Administered 2015-12-08: 40 mg via ORAL
  Filled 2015-12-07 (×2): qty 1
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
## 2015-12-07 NOTE — Care Management Obs Status (Signed)
MEDICARE OBSERVATION STATUS NOTIFICATION   Patient Details  Name: Hubert AzureRonald J Soberanes MRN: [id number] Date of Birth: 1945-10-31   Medicare Observation Status Notification Given:  Yes   Cherrie DistanceChandler, Alegra Rost L, RN 12/07/2015, 3:48 PM

## 2015-12-07 NOTE — Progress Notes (Signed)
Myoview 12/07/2015  IMPRESSION: 1. No stress-induced perfusion defect to suggest stress-induced ischemia. There is a fixed defect at the inferolateral apex compatible with scar.  2. Global hypokinesis.  3. Left ventricular ejection fraction 36%  4. Non invasive risk stratification*: Intermediate   Plan to continue diuresis today and discharge tomorrow.   Ramond DialSigned, Rowan Blaker PA Pager: [phone]
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
## 2015-12-07 NOTE — Progress Notes (Signed)
1 day lexiscan stress test completed without significant complication  Ramond DialSigned, Norie Latendresse PA Pager: [phone]

## 2015-12-08 ENCOUNTER — Telehealth: Payer: Self-pay | Admitting: Physician Assistant

## 2015-12-08 DIAGNOSIS — I2510 Atherosclerotic heart disease of native coronary artery without angina pectoris: Secondary | ICD-10-CM | POA: Diagnosis not present

## 2015-12-08 DIAGNOSIS — R0609 Other forms of dyspnea: Secondary | ICD-10-CM | POA: Diagnosis not present

## 2015-12-08 DIAGNOSIS — I10 Essential (primary) hypertension: Secondary | ICD-10-CM | POA: Diagnosis not present

## 2015-12-08 DIAGNOSIS — I5033 Acute on chronic diastolic (congestive) heart failure: Secondary | ICD-10-CM | POA: Diagnosis not present

## 2015-12-08 DIAGNOSIS — I5041 Acute combined systolic (congestive) and diastolic (congestive) heart failure: Secondary | ICD-10-CM | POA: Diagnosis not present

## 2015-12-08 LAB — BASIC METABOLIC PANEL
ANION GAP: 7 (ref 5–15)
BUN: 20 mg/dL (ref 6–20)
CALCIUM: 11.4 mg/dL — AB (ref 8.9–10.3)
CO2: 29 mmol/L (ref 22–32)
Chloride: 102 mmol/L (ref 101–111)
Creatinine, Ser: 1.08 mg/dL (ref 0.61–1.24)
Glucose, Bld: 124 mg/dL — ABNORMAL HIGH (ref 65–99)
POTASSIUM: 3.9 mmol/L (ref 3.5–5.1)
Sodium: 138 mmol/L (ref 135–145)

## 2015-12-08 MED ORDER — FUROSEMIDE 40 MG PO TABS: 40.0000 mg | ORAL_TABLET | Freq: Every day | ORAL | Status: DC

## 2015-12-08 NOTE — Telephone Encounter (Signed)
Anticipated discharge from the hospital for this pt will be today 12/08/15.  Pt will need to be followed up with a TCM call by triage tomorrow 12/09/15.

## 2015-12-08 NOTE — Discharge Instructions (Signed)
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
## 2015-12-08 NOTE — Progress Notes (Signed)
Pt has orders to be discharged. Discharge instructions given and pt has no additional questions at this time. Medication regimen reviewed and pt educated. Pt verbalized understanding and has no additional questions. Telemetry box removed. IV removed and site in good condition. Pt stable and waiting for transportation.   Heli Dino RN 
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
## 2015-12-08 NOTE — Telephone Encounter (Signed)
New message      TCM per Dayna scheduled on 12-17-15 with Dayna.

## 2015-12-09 NOTE — Telephone Encounter (Signed)
Called pt at home and on cell, no VM setup on either line.

## 2015-12-10 NOTE — Telephone Encounter (Signed)
[facility]  Patient understands to follow up with provider Yes.  Ronie Spiesayna Dunn, PA on June 22,2017 at 9:30 at [facility] Patient understands discharge instructions?  yes Patient understands medications and regiment?  yes Patient understands to bring all medications to this visit?  yes

## 2015-12-15 ENCOUNTER — Telehealth: Payer: Self-pay | Admitting: Cardiology

## 2015-12-15 ENCOUNTER — Encounter: Payer: Self-pay | Admitting: Physician Assistant

## 2015-12-15 NOTE — Telephone Encounter (Signed)
New message     1. What dental office are you calling from? Dr. [facility]  2. What is your office phone and fax number? NFAOZH#[phone]/EXBOffice#[phone]/fax # [phone]  3. What type of procedure is the patient having performed? Regular cleaning  4. What date is procedure scheduled? Now today the pt is there now  5. What is your question (ex. Antibiotics prior to procedure, holding medication-we need to know how long dentist wants pt to hold med)? Does the pt need a pre-med before procedure   The office said just fax back to let them know since the pt is there now

## 2015-12-15 NOTE — Progress Notes (Signed)
Cardiology Office Note    Date:  12/17/2015  ID:  Stanley [name], DOB 1945-12-19, MRN [id number] PCP:  Donovan KailOSS,ALLeN, MD  Cardiologist:  Mayford Knifeurner   Chief Complaint: f/u CHF  History of Present Illness:  Stanley [name] is a 70 y.o. male with history of CAD (stenting of RCA, OM1, last cath 2012 with OM intervention), HLD, essential HTN, elevated CPK (persistent after stopping statin), hyperparathyroidism who presents for post-hospital follow-up. He was recently admitted with SOB, elevated BNP, mildly elevated troponin (0.09) in the setting of dietary indiscretions. 2D Echo 12/05/15: EF 55-60%, no RWMA, grade 3 diastolic dysfunction, mild-mod MR. Nuclear stress test was performed 12/07/15 showing no evidence of ischemia, + fixed defect c/w scar, EF 36% - Dr. Allyson SabalBerry reviewed the study and felt the EF by echo was more accurate. He diuresed 12lb down to 212lb. Cr 1.08 at dc.  He comes in for follow-up doing well. He says he had to laugh at the pictures of high [facility] foods [facility] - he realized this was pretty much his whole diet. He has since cut out high-sodium foods and is feeling much better. Dry weight by home scale is 207-209. He is [facility] to [facility] the senior center and feels great. No CP or SOB. Trace sockline edema persists. He has f/u with [facility] in 01/2016 to follow his vein reflux.     Past Medical History  Diagnosis Date  . Hernia     umbilical  . Glaucoma   . Plantar fasciitis   . Prostate infection   . Carpal tunnel syndrome     bilateral  . Cholelithiasis   . Elevated CPK     w normal troponin felt due to statin use-muscle bx of L deltoid by rheum in the past, non diagnostic, stable CKs at 7-900 range since 1999; however, CK remained elevated despite statin cessation  . Hyperparathyroidism (HCC)   . Hepatic steatosis   . CAD (coronary artery disease)     a. h/o stenting of RCA, OM1. b. last cath 2012 with OM intervention. c. Nuc 11/2015: scar, no ischemia, EF 36% but  EF by echo same admission was normal at 55-60%.  . Hypertension   . Hyperlipidemia     Past Surgical History  Procedure Laterality Date  . Mandible surgery    . Heart stent  2002  . Hernia repair      umbilical  . Carpal tunnel release      bilateral  . Coronary artery bypass graft  1993    Current Medications: Current Outpatient Prescriptions  Medication Sig Dispense Refill  . allopurinol (ZYLOPRIM) 100 MG tablet Take 200 mg by mouth daily.     [name]. aspirin 81 MG tablet Take 1 tablet (81 mg total) by mouth daily.    . colchicine 0.6 MG tablet Take 0.6 mg by mouth daily. GOUT    . diclofenac sodium (VOLTAREN) 1 % GEL APPLY 4 GRAMS ON AFFECTED SKIN 4 TIMES A DAY  0  . fluticasone (FLONASE) 50 MCG/ACT nasal spray Place 1 spray into both nostrils daily as needed for allergies.   2  . furosemide (LASIX) 40 MG tablet Take 1 tablet (40 mg total) by mouth daily. 30 tablet 1  . latanoprost (XALATAN) 0.005 % ophthalmic solution Place 1 drop into both eyes at bedtime.     [name]. losartan (COZAAR) 100 MG tablet TAKE 1 TABLET BY MOUTH DAILY. 90 tablet 3  . Multiple Vitamins-Minerals (CENTRUM CARDIO PO)  Take 1 tablet by mouth at bedtime.     . nitroGLYCERIN (NITROLINGUAL) 0.4 MG/SPRAY spray Place 1 spray under the tongue every 5 (five) minutes x 3 doses as needed for chest pain.    . Omega 3 1000 MG CAPS Take 1 capsule by mouth at bedtime.      No current facility-administered medications for this visit.     Allergies:   Meloxicam; Methocarbamol; and Statins   Social History   Social History  . Marital Status: Widowed    Spouse Name: N/A  . Number of Children: N/A  . Years of Education: N/A   Social History Main Topics  . Smoking status: Never Smoker   . Smokeless tobacco: Never Used  . Alcohol Use: No  . Drug Use: No  . Sexual Activity: Not Asked   Other Topics Concern  . None   Social History Narrative     Family History:  The patient's family history includes Heart disease in  his father; Hyperlipidemia in his father and mother; Hypertension in his sister. There is no history of Heart attack.   ROS:   Please see the history of present illness.  All other systems are reviewed and otherwise negative.    PHYSICAL EXAM:   VS:  BP 100/70 mmHg  Pulse 84  Ht 5' 6.5" (1.689 m)  Wt 212 lb 6.4 oz (96.344 kg)  BMI 33.77 kg/m2  BMI: Body mass index is 33.77 kg/(m^2). GEN: Well nourished, well developed AAM, in no acute distress HEENT: normocephalic, atraumatic Neck: no JVD, carotid bruits, or masses Cardiac: RRR; no murmurs, rubs, or gallops, trace sockline edema only - much improved Respiratory:  clear to auscultation bilaterally, normal work of breathing GI: soft, nontender, nondistended, + BS MS: no deformity or atrophy Skin: warm and dry, no rash Neuro:  Alert and Oriented x 3, Strength and sensation are intact, follows commands Psych: euthymic mood, full affect  Wt Readings from Last 3 Encounters:  12/17/15 212 lb 6.4 oz (96.344 kg)  12/08/15 212 lb 6.4 oz (96.344 kg)  05/13/15 189 lb (85.73 kg)      Studies/Labs Reviewed:   EKG: EKG was not ordered today.  Recent Labs: 05/13/2015: ALT 45 12/04/2015: B Natriuretic Peptide 264.3* 12/07/2015: Hemoglobin 15.2; Platelets 168 12/08/2015: BUN 20; Creatinine, Ser 1.08; Potassium 3.9; Sodium 138   Lipid Panel    Component Value Date/Time   CHOL 251* 12/05/2015 1003   TRIG 110 12/05/2015 1003   HDL 30* 12/05/2015 1003   CHOLHDL 8.4 12/05/2015 1003   VLDL 22 12/05/2015 1003   LDLCALC 199* 12/05/2015 1003    Additional studies/ records that were reviewed today include: Summarized above.    ASSESSMENT & PLAN:   1. Chronic diastolic CHF - doing well. We reviewed daily weights, low salt diet, fluid restriction. Continue present regimen.  2. CAD - no recent anginal sx. Continue ASA. He is not on BB - will not start one due to tendency towards borderline BPs. See below re: statins. 3. HTN - controlled. He  brings in a log of his BPs, mostly hovering around 110/70. He has occasional lower BP but is asymptomatic with these. Continue present regimen and follow. 4. Hyperlipidemia - LDL 199, intolerant of statins. He was previously not able to enroll in Repatha study and cannot afford the medication. At this next follow-up he should touch base with pharmD to find out if there is a time period of re-enrollment in the new year.  Disposition: F/u with  Dr. [name] in 2-3 months.   Medication Adjustments/Labs and Tests Ordered: Current medicines are reviewed at length with the patient today.  Concerns regarding medicines are outlined above. Medication changes, Labs and Tests ordered today are summarized above and listed in the Patient Instructions accessible in Encounters.   Thomasene Mohair PA-C  12/17/2015 9:59 AM    [facility] [address] [place], [place], [place]  [address] Phone: [phone]; Fax: [phone]

## 2015-12-15 NOTE — Telephone Encounter (Signed)
Per Sally, [facility], no pre-meds are necessary prior to procedure. No medications need to be held.

## 2015-12-17 ENCOUNTER — Ambulatory Visit (INDEPENDENT_AMBULATORY_CARE_PROVIDER_SITE_OTHER): Payer: Medicare Other | Admitting: Physician Assistant

## 2015-12-17 ENCOUNTER — Encounter: Payer: Self-pay | Admitting: Physician Assistant

## 2015-12-17 VITALS — BP 100/70 | HR 84 | Ht 66.5 in | Wt 212.4 lb

## 2015-12-17 DIAGNOSIS — I10 Essential (primary) hypertension: Secondary | ICD-10-CM

## 2015-12-17 DIAGNOSIS — E785 Hyperlipidemia, unspecified: Secondary | ICD-10-CM

## 2015-12-17 DIAGNOSIS — I5032 Chronic diastolic (congestive) heart failure: Secondary | ICD-10-CM

## 2015-12-17 DIAGNOSIS — I2510 Atherosclerotic heart disease of native coronary artery without angina pectoris: Secondary | ICD-10-CM | POA: Diagnosis not present

## 2015-12-17 MED ORDER — NITROGLYCERIN 0.4 MG/SPRAY TL SOLN: 1.0000 | Status: AC | PRN

## 2015-12-17 NOTE — Patient Instructions (Addendum)
Medication Instructions:  Your physician recommends that you continue on your current medications as directed. Please refer to the Current Medication list given to you today.   Labwork: None ordered  Testing/Procedures: None ordered  Follow-Up: Your physician recommends that you schedule a follow-up appointment in: 2-3 MONTHS WITH DR. TURNER   Any Other Special Instructions Will Be Listed Below (If Applicable).     If you need a refill on your cardiac medications before your next appointment, please call your pharmacy.   

## 2016-01-05 ENCOUNTER — Encounter: Payer: Self-pay | Admitting: Vascular Surgery

## 2016-01-05 ENCOUNTER — Ambulatory Visit (INDEPENDENT_AMBULATORY_CARE_PROVIDER_SITE_OTHER): Payer: Medicare Other | Admitting: Vascular Surgery

## 2016-01-05 VITALS — BP 96/65 | HR 85 | Temp 98.1°F | Resp 18 | Ht 68.5 in | Wt 213.4 lb

## 2016-01-05 DIAGNOSIS — I83892 Varicose veins of left lower extremities with other complications: Secondary | ICD-10-CM

## 2016-01-05 NOTE — Progress Notes (Signed)
Subjective:     Patient ID: Stanley [name], male   DOB: 08-06-1945, 70 y.o.   MRN: [id number]  HPI this 70 year old male was previously evaluated by Dr. Myra GianottiBrabham and myself in 2013 for small varicose veins particularly behind the left knee. He did not have significant superficial venous reflux or large caliber veins and have improvement with elastic compression stockings. Recommendation was made for sclerotherapy if he desired treatment. He now returns with bilateral lower extremity edema. He did have sclerotherapy performed in the left popliteal area and states that that did relieve his symptoms. He has no history of DVT thrombophlebitis stasis ulcers or bleeding. He does not were elastic compression stockings on a regular basis.  Past Medical History  Diagnosis Date  . Hernia     umbilical  . Glaucoma   . Plantar fasciitis   . Prostate infection   . Carpal tunnel syndrome     bilateral  . Cholelithiasis   . Elevated CPK     w normal troponin felt due to statin use-muscle bx of L deltoid by rheum in the past, non diagnostic, stable CKs at 7-900 range since 1999; however, CK remained elevated despite statin cessation  . Hyperparathyroidism (HCC)   . Hepatic steatosis   . CAD (coronary artery disease)     a. h/o stenting of RCA, OM1. b. last cath 2012 with OM intervention. c. Nuc 11/2015: scar, no ischemia, EF 36% but EF by echo same admission was normal at 55-60%.  . Hypertension   . Hyperlipidemia     Social History  Substance Use Topics  . Smoking status: Never Smoker   . Smokeless tobacco: Never Used  . Alcohol Use: No    Family History  Problem Relation Age of Onset  . Heart disease Father     before age 70  . Hyperlipidemia Father   . Hyperlipidemia Mother   . Heart attack Neg Hx   . Hypertension Sister     Allergies  Allergen Reactions  . Meloxicam Other (See Comments)    Joint aching   . Methocarbamol Other (See Comments)    UNKNOWN TO PATIENT  . Statins Other  (See Comments) and Anaphylaxis    Myalgias, even with Crestor once weekly     Current outpatient prescriptions:  .  allopurinol (ZYLOPRIM) 100 MG tablet, Take 200 mg by mouth daily. , Disp: , Rfl:  .  aspirin 81 MG tablet, Take 1 tablet (81 mg total) by mouth daily., Disp: , Rfl:  .  colchicine 0.6 MG tablet, Take 0.6 mg by mouth daily. GOUT, Disp: , Rfl:  .  diclofenac sodium (VOLTAREN) 1 % GEL, APPLY 4 GRAMS ON AFFECTED SKIN 4 TIMES A DAY, Disp: , Rfl: 0 .  fluticasone (FLONASE) 50 MCG/ACT nasal spray, Place 1 spray into both nostrils daily as needed for allergies. , Disp: , Rfl: 2 .  furosemide (LASIX) 40 MG tablet, Take 1 tablet (40 mg total) by mouth daily., Disp: 30 tablet, Rfl: 1 .  latanoprost (XALATAN) 0.005 % ophthalmic solution, Place 1 drop into both eyes at bedtime. , Disp: , Rfl:  .  losartan (COZAAR) 100 MG tablet, TAKE 1 TABLET BY MOUTH DAILY., Disp: 90 tablet, Rfl: 3 .  Multiple Vitamins-Minerals (CENTRUM CARDIO PO), Take 1 tablet by mouth at bedtime. , Disp: , Rfl:  .  nitroGLYCERIN (NITROLINGUAL) 0.4 MG/SPRAY spray, Place 1 spray under the tongue every 5 (five) minutes x 3 doses as needed for chest pain., Disp: 25 g,  Rfl: 3 .  Omega 3 1000 MG CAPS, Take 1 capsule by mouth at bedtime. , Disp: , Rfl:   Filed Vitals:   01/05/16 1533  BP: 96/65  Pulse: 85  Temp: 98.1 F (36.7 C)  TempSrc: Oral  Resp: 18  Height: 5' 8.5" (1.74 m)  Weight: 213 lb 6.4 oz (96.798 kg)  SpO2: 95%    Body mass index is 31.97 kg/(m^2).           Review of Systems denies chest pain, dyspnea on exertion, PND, orthopnea, hemoptysis     Objective:   Physical Exam BP 96/65 mmHg  Pulse 85  Temp(Src) 98.1 F (36.7 C) (Oral)  Resp 18  Ht 5' 8.5" (1.74 m)  Wt 213 lb 6.4 oz (96.798 kg)  BMI 31.97 kg/m2  SpO2 95%  Gen. well-developed well-nourished male no apparent stress alert and oriented 3 Lungs no rhonchi or wheezing Lower extremities with minimal edema distally. No bulging  varicosities noted. 3+ dorsalis pedis pulse palpable bilaterally.  Today I performed a bedside SonoSite ultrasound exam which reveals normal-size great saphenous veins with no obvious reflux.     Assessment:     Bilateral lower extremity edema with no significant superficial venous reflux    Plan:     #1 short leg elastic compression stockings 20-30 millimeter gradient #2 elevate foot of bed 2-3 inches #3 no further suggestions no treatment other than this recommended

## 2016-01-26 ENCOUNTER — Ambulatory Visit: Payer: Medicare Other | Admitting: Vascular Surgery

## 2016-01-26 ENCOUNTER — Encounter (HOSPITAL_COMMUNITY): Payer: Medicare Other

## 2016-03-02 ENCOUNTER — Telehealth: Payer: Self-pay | Admitting: Cardiology

## 2016-03-02 NOTE — Telephone Encounter (Signed)
Stanley [name] is  Calling because he wants to let Dr. Mayford Knifeurner know that he went to his PCP and they put him back on the Furosmide . Dr. Mayford Knifeurner took him off of the medication (fursomide) .

## 2016-03-02 NOTE — Telephone Encounter (Signed)
Attempted to call patient's home, but no one picked up and there was no way to leave a VM. Left message on cell phone number to call back to verify medications.

## 2016-03-21 ENCOUNTER — Encounter: Payer: Self-pay | Admitting: Cardiology

## 2016-03-21 ENCOUNTER — Ambulatory Visit (INDEPENDENT_AMBULATORY_CARE_PROVIDER_SITE_OTHER): Payer: Medicare Other | Admitting: Cardiology

## 2016-03-21 VITALS — BP 114/70 | HR 83 | Ht 68.5 in | Wt 209.0 lb

## 2016-03-21 DIAGNOSIS — I2510 Atherosclerotic heart disease of native coronary artery without angina pectoris: Secondary | ICD-10-CM

## 2016-03-21 DIAGNOSIS — I10 Essential (primary) hypertension: Secondary | ICD-10-CM | POA: Diagnosis not present

## 2016-03-21 DIAGNOSIS — I5042 Chronic combined systolic (congestive) and diastolic (congestive) heart failure: Secondary | ICD-10-CM | POA: Insufficient documentation

## 2016-03-21 DIAGNOSIS — I5032 Chronic diastolic (congestive) heart failure: Secondary | ICD-10-CM

## 2016-03-21 DIAGNOSIS — E785 Hyperlipidemia, unspecified: Secondary | ICD-10-CM

## 2016-03-21 HISTORY — DX: Atherosclerotic heart disease of native coronary artery without angina pectoris: I25.10

## 2016-03-21 LAB — BASIC METABOLIC PANEL WITH GFR
BUN: 18 mg/dL (ref 7–25)
CO2: 27 mmol/L (ref 20–31)
Calcium: 10.9 mg/dL — ABNORMAL HIGH (ref 8.6–10.3)
Chloride: 104 mmol/L (ref 98–110)
Creat: 1 mg/dL (ref 0.70–1.18)
Glucose, Bld: 125 mg/dL — ABNORMAL HIGH (ref 65–99)
Potassium: 4 mmol/L (ref 3.5–5.3)
Sodium: 140 mmol/L (ref 135–146)

## 2016-03-21 NOTE — Patient Instructions (Signed)
Medication Instructions:  Your physician recommends that you continue on your current medications as directed. Please refer to the Current Medication list given to you today.   Labwork: TODAY: BMET  Testing/Procedures: None  Follow-Up: You have been referred to [facility].  Your physician wants you to follow-up in: 6 months with Dr. Norris Crossurner's assistant.  You will receive a reminder letter in the mail two months in advance. If you don't receive a letter, please call our office to schedule the follow-up appointment.   Your physician wants you to follow-up in: 1 year with Dr. Mayford Knifeurner. You will receive a reminder letter in the mail two months in advance. If you don't receive a letter, please call our office to schedule the follow-up appointment.   Any Other Special Instructions Will Be Listed Below (If Applicable).     If you need a refill on your cardiac medications before your next appointment, please call your pharmacy.

## 2016-03-21 NOTE — Telephone Encounter (Addendum)
Patient was seen today, 03/21/16.

## 2016-03-21 NOTE — Addendum Note (Signed)
Addended by: Gunnar FusiKEMP, KATHRYN A on: 03/21/2016 08:59 AM   Modules accepted: Orders

## 2016-03-21 NOTE — Progress Notes (Signed)
Cardiology Office Note    Date:  03/21/2016   ID:  Kemp, Gomes [birth year], MRN [id number]  PCP:  Donovan Kail, MD  Cardiologist:  [name], MD   Chief Complaint  Patient presents with  . Coronary Artery Disease  . Hypertension  . Hyperlipidemia    History of Present Illness:  Stanley [name] is a 70 y.o. male  with history of CAD (stenting of RCA, OM1, last cath 2012 with OM intervention), HLD, essential HTN, elevated CPK (persistent after stopping statin), hyperparathyroidism who presents for follow-up. He was admitted 11/2015 with SOB, elevated BNP, mildly elevated troponin (0.09) in the setting of dietary indiscretions. 2D Echo 12/05/15: EF 55-60%, no RWMA, grade 3 diastolic dysfunction, mild-mod MR. Nuclear stress test was performed 12/07/15 showing no evidence of ischemia, + fixed defect c/w scar.  He denies any chest pain, SOB, DOE, LE edema, dizziness, palpitations, claudication or syncope.  He is back to swimming [facility] the senior center and feels great.   Past Medical History:  Diagnosis Date  . CAD (coronary artery disease), native coronary artery 03/21/2016   stenting of RCA, OM1, last cath 2012 with OM intervention  . Carpal tunnel syndrome    bilateral  . Cholelithiasis   . Chronic diastolic CHF (congestive heart failure) (HCC)   . Elevated CPK    w normal troponin felt due to statin use-muscle bx of L deltoid by rheum in the past, non diagnostic, stable CKs at 7-900 range since 1999; however, CK remained elevated despite statin cessation  . Glaucoma   . Hepatic steatosis   . Hernia    umbilical  . Hyperlipidemia   . Hyperparathyroidism (HCC)   . Hypertension   . Plantar fasciitis   . Prostate infection     Past Surgical History:  Procedure Laterality Date  . CARPAL TUNNEL RELEASE     bilateral  . CHOLECYSTECTOMY  2015  . CORONARY ARTERY BYPASS GRAFT  1993  . heart stent  2002  . HERNIA REPAIR     umbilical  . MANDIBLE SURGERY      Current  Medications: Outpatient Medications Prior to Visit  Medication Sig Dispense Refill  . allopurinol (ZYLOPRIM) 100 MG tablet Take 200 mg by mouth daily.     [name] aspirin 81 MG tablet Take 1 tablet (81 mg total) by mouth daily.    . colchicine 0.6 MG tablet Take 0.6 mg by mouth daily. GOUT    . diclofenac sodium (VOLTAREN) 1 % GEL APPLY 4 GRAMS ON AFFECTED SKIN 4 TIMES A DAY  0  . fluticasone (FLONASE) 50 MCG/ACT nasal spray Place 1 spray into both nostrils daily as needed for allergies.   2  . furosemide (LASIX) 40 MG tablet Take 1 tablet (40 mg total) by mouth daily. 30 tablet 1  . latanoprost (XALATAN) 0.005 % ophthalmic solution Place 1 drop into both eyes at bedtime.     [name] losartan (COZAAR) 100 MG tablet TAKE 1 TABLET BY MOUTH DAILY. 90 tablet 3  . Multiple Vitamins-Minerals (CENTRUM CARDIO PO) Take 1 tablet by mouth at bedtime.     . nitroGLYCERIN (NITROLINGUAL) 0.4 MG/SPRAY spray Place 1 spray under the tongue every 5 (five) minutes x 3 doses as needed for chest pain. 25 g 3  . Omega 3 1000 MG CAPS Take 1 capsule by mouth at bedtime.      No facility-administered medications prior to visit.      Allergies:   Meloxicam; Methocarbamol; and Statins  Social History   Social History  . Marital status: Widowed    Spouse name: N/A  . Number of children: N/A  . Years of education: N/A   Social History Main Topics  . Smoking status: Never Smoker  . Smokeless tobacco: Never Used  . Alcohol use No  . Drug use: No  . Sexual activity: Not Asked   Other Topics Concern  . None   Social History Narrative  . None     Family History:  The patient's family history includes Heart disease in his father; Hyperlipidemia in his father and mother; Hypertension in his sister.   ROS:   Please see the history of present illness.    ROS All other systems reviewed and are negative.  No flowsheet data found.     PHYSICAL EXAM:   VS:  BP 114/70   Pulse 83   Ht 5' 8.5" (1.74 m)   Wt 209 lb  (94.8 kg)   SpO2 99%   BMI 31.32 kg/m    GEN: Well nourished, well developed, in no acute distress  HEENT: normal  Neck: no JVD, carotid bruits, or masses Cardiac: RRR; no murmurs, rubs, or gallops,no edema.  Intact distal pulses bilaterally.  Respiratory:  clear to auscultation bilaterally, normal work of breathing GI: soft, nontender, nondistended, + BS MS: no deformity or atrophy  Skin: warm and dry, no rash Neuro:  Alert and Oriented x 3, Strength and sensation are intact Psych: euthymic mood, full affect  Wt Readings from Last 3 Encounters:  03/21/16 209 lb (94.8 kg)  01/05/16 213 lb 6.4 oz (96.8 kg)  12/17/15 212 lb 6.4 oz (96.3 kg)      Studies/Labs Reviewed:   EKG:  EKG is ordered today.  The ekg ordered today demonstrates   Recent Labs: 05/13/2015: ALT 45 12/04/2015: B Natriuretic Peptide 264.3 12/07/2015: Hemoglobin 15.2; Platelets 168 12/08/2015: BUN 20; Creatinine, Ser 1.08; Potassium 3.9; Sodium 138   Lipid Panel    Component Value Date/Time   CHOL 251 (H) 12/05/2015 1003   TRIG 110 12/05/2015 1003   HDL 30 (L) 12/05/2015 1003   CHOLHDL 8.4 12/05/2015 1003   VLDL 22 12/05/2015 1003   LDLCALC 199 (H) 12/05/2015 1003    Additional studies/ records that were reviewed today include:  none    ASSESSMENT:    1. Chronic diastolic heart failure (HCC)   2. Essential hypertension, benign   3. Coronary artery disease involving native coronary artery of native heart without angina pectoris   4. Hyperlipidemia      PLAN:  In order of problems listed above:  1. Chronic diastolic CHF - he appears euvolemic on exam today. Continue diuretic and ARB. 2. HTN - BP controlled on current meds.  Continue ARB. 3. ASCAD s/p stenting of RCA, OM1, last cath 2012 with OM intervention.  He has no angina.  Continue ASA.  He is not on statin due to history of elevated CPK. 4. Hyperlipidemia with LDL goal < 70.  He is not on statin due to history of elevated CPK.  I will refer  to lipid clinic for possible PCSK 9 drug.     Medication Adjustments/Labs and Tests Ordered: Current medicines are reviewed at length with the patient today.  Concerns regarding medicines are outlined above.  Medication changes, Labs and Tests ordered today are listed in the Patient Instructions below.  There are no Patient Instructions on file for this visit.   Signed, Armanda Magicraci Turner, MD  03/21/2016 8:48  AM    [facility] [address], [place], [place]  [address] Phone: [phone]; Fax: [phone]

## 2016-03-28 NOTE — Progress Notes (Signed)
Patient ID: Stanley [name]                 DOB: Jan 26, 1946                    MRN: [id number]     HPI: Stanley [name] is a 70 y.o. male patient referred to lipid clinic by Dr Mayford Knifeurner. PMH is significant for CAD (stenting of RCA, OM1, last cath 2012 with OM intervention), HLD, essential HTN, elevated CPK in 700-900 range (persistent after stopping statin), and hyperparathyroidism. CK elevations up to 1,234 in July 2012.   Pt has previously been on Repatha (was enrolled in Repatha Ready program which ended in 2016), then changed to Praluent due to insurance preference. He was unable to afford Praluent due to monthly copay of $400 a month after approval.  Patient reports trying multiple statins in the past. The only specific statin he can recall is Lipitor, which caused his muscles to freeze up and give him severe pain. He also had flushing with niacin.  Current Medications: fish oil 1g dailiy Intolerances: Pt reports intolerances to all statins including once weekly dosing. Remembers trying Lipitor - muscle aches. Niacin - flushing. Risk Factors: CAD, HTN, age LDL goal: 70mg /dL  Diet: Low sodium diet. Has been eating more vegetables and salads. Cut out chips and cheese, switched to [place]key bacon.   Exercise: Swims 3 times a week at the senior center for an hour.  Family History: Heart disease in his father (died at age 70); Hyperlipidemia in his father and mother; Hypertension in his sister.   Social History: Patient denies tobacco, alcohol, and illicit drug use.  Labs: 12/05/15: TC 251, TG 110, HDL 30, LDL 199 (no therapy) 04/2015: TC 98, TG 112, HDL 41, LDL 35 (on Repatha 140mg  twice monthly)  Past Medical History:  Diagnosis Date  . CAD (coronary artery disease), native coronary artery 03/21/2016   stenting of RCA, OM1, last cath 2012 with OM intervention  . Carpal tunnel syndrome    bilateral  . Cholelithiasis   . Chronic diastolic CHF (congestive heart failure) (HCC)   . Elevated  CPK    w normal troponin felt due to statin use-muscle bx of L deltoid by rheum in the past, non diagnostic, stable CKs at 7-900 range since 1999; however, CK remained elevated despite statin cessation  . Glaucoma   . Hepatic steatosis   . Hernia    umbilical  . Hyperlipidemia   . Hyperparathyroidism (HCC)   . Hypertension   . Plantar fasciitis   . Prostate infection     Current Outpatient Prescriptions on File Prior to Visit  Medication Sig Dispense Refill  . allopurinol (ZYLOPRIM) 100 MG tablet Take 200 mg by mouth daily.     [name]. aspirin 81 MG tablet Take 1 tablet (81 mg total) by mouth daily.    . colchicine 0.6 MG tablet Take 0.6 mg by mouth daily. GOUT    . diclofenac sodium (VOLTAREN) 1 % GEL APPLY 4 GRAMS ON AFFECTED SKIN 4 TIMES A DAY  0  . fluticasone (FLONASE) 50 MCG/ACT nasal spray Place 1 spray into both nostrils daily as needed for allergies.   2  . furosemide (LASIX) 40 MG tablet Take 1 tablet (40 mg total) by mouth daily. 30 tablet 1  . latanoprost (XALATAN) 0.005 % ophthalmic solution Place 1 drop into both eyes at bedtime.     [name]. losartan (COZAAR) 100 MG tablet TAKE 1 TABLET BY MOUTH  DAILY. 90 tablet 3  . Multiple Vitamins-Minerals (CENTRUM CARDIO PO) Take 1 tablet by mouth at bedtime.     . nitroGLYCERIN (NITROLINGUAL) 0.4 MG/SPRAY spray Place 1 spray under the tongue every 5 (five) minutes x 3 doses as needed for chest pain. 25 g 3  . Omega 3 1000 MG CAPS Take 1 capsule by mouth at bedtime.      No current facility-administered medications on file prior to visit.     Allergies  Allergen Reactions  . Meloxicam Other (See Comments)    Joint aching   . Methocarbamol Other (See Comments)    UNKNOWN TO PATIENT  . Statins Other (See Comments) and Anaphylaxis    Myalgias, even with Crestor once weekly    Assessment/Plan:  1. Hyperlipidemia - LDL 199 far above goal 70mg /dL given history of CAD and multiple stents. Patient is intolerant to multiple statins and  hesitant at this time to rechallenge. He was previously on PCSK9i however it became unaffordable for him. He is willing to try Zetia 10mg  daily. Will recheck lipids in 3 months. Pt is aware this will not bring his LDL to goal and we will discuss addition of low dose statin at that time; would be cautious with statin dose given history of CK elevations. Discussed clinical trials at [facility] and pt is not interested at this time.  Patient signed informed consent for [facility] lipid registry.  Megan E. Supple, PharmD, CPP [facility] [address] [place], [place] [address] Phone: [phone]; Fax: [phone] 03/29/2016 9:18 AM

## 2016-03-29 ENCOUNTER — Ambulatory Visit (INDEPENDENT_AMBULATORY_CARE_PROVIDER_SITE_OTHER): Payer: Medicare Other | Admitting: Pharmacist

## 2016-03-29 ENCOUNTER — Encounter: Payer: Self-pay | Admitting: *Deleted

## 2016-03-29 VITALS — BP 118/76 | HR 62 | Ht 68.5 in | Wt 209.0 lb

## 2016-03-29 DIAGNOSIS — Z006 Encounter for examination for normal comparison and control in clinical research program: Secondary | ICD-10-CM

## 2016-03-29 DIAGNOSIS — E785 Hyperlipidemia, unspecified: Secondary | ICD-10-CM

## 2016-03-29 MED ORDER — EZETIMIBE 10 MG PO TABS: 10.0000 mg | ORAL_TABLET | Freq: Every day | ORAL | 11 refills | Status: DC

## 2016-03-29 NOTE — Progress Notes (Signed)
Late entry:  Subject met inclusion and exclusion criteria. The informed consent form, study requirements and expectations were reviewed with the subject and questions and concerns were addressed prior to the signing of the consent form. The subject verbalized understanding of the trail requirements. The subject agreed to participate in the [place] and signed the informed consent. The informed consent was obtained prior to performance of any protocol-specific procedures for the subject. A copy of the signed informed consent was given to the subject and a copy was placed in the subject's medical record.  Jake Bathe, RN 03/29/2016

## 2016-03-29 NOTE — Patient Instructions (Addendum)
Start taking Zetia (ezetimibe) 10mg  once daily.   We will recheck your cholesterol in 3 months on Monday, January 8th. Come in any time after 7:30am for fasting blood work.  Call Calah Gershman in lipid clinic with any tolerance problems.  We will likely need to add on low dose Crestor in the future.

## 2016-05-10 ENCOUNTER — Telehealth: Payer: Self-pay | Admitting: Pharmacist

## 2016-05-10 NOTE — Telephone Encounter (Signed)
Patient meets the criteria for CLEAR if his LDL is <190 at time of screening however [place].  He meets criteria for ORION-10 once the trial begins the LDL criteria is LDL>70. Enrollment in a drug or device trial will pull him out of [facility].

## 2016-05-10 NOTE — Telephone Encounter (Signed)
Patient called to report that he has developed myalgias all over once he started taking Zetia. Symptoms occurred shortly after starting the Zetia and continued to progress. Symptoms feel similar to the myalgias he experienced when he took statins previously.  PCSK9i is cost prohibitive and pt has already failed multiple statins including once weekly dosing. Discussed clinical trials again with patient and he is more willing now since we do not have any great alternatives for lipid lowering therapies given his intolerances.  Will route note to our research nurses to prescreen patient for CLEAR trial. Will not qualify for neurocog Praluent trial since he was previously on Repatha. If he does not qualify for CLEAR, he may qualify for upcoming ORION-10 study.

## 2016-05-12 NOTE — Telephone Encounter (Signed)
Agree that if patient qualifies for CLEAR or ORION-10 that he should have the opportunity to receive active drug. Ok to pull him from [place] if he qualifies. I have tried to contact pt for the past few days to discuss this with him but have not heard back yet. Feel free to attempt to reach out to patient too.

## 2016-05-16 NOTE — Telephone Encounter (Signed)
Contacted patient and he is fine with pulling out of the [facility] trial to pursue either CLEAR or ORION-10. Will route to research nurses to contact pt for enrollment.

## 2016-05-27 NOTE — Telephone Encounter (Signed)
Signa KellHi Megan,  [name] will be coming for a CLEAR screening visit on Wednesday June 01, 2016 at 0900.

## 2016-06-07 NOTE — Telephone Encounter (Signed)
Aundra MilletMegan, Mr. Andrey CampanileWilson screen failed for CLEAR, CPK 1047, Dr. Norris Crossurner's note stated that it was a chronic elevation and the pt told me that Dr. Fraser DinPreston also had stated the same.  I will [facility] to [facility] for [facility]ION screening.

## 2016-06-07 NOTE — Telephone Encounter (Signed)
He is still enrolled in [facility], I didn't withdraw him pending results from CLEAR. If [place] gets into [place] I will withdraw him at that time.

## 2016-06-07 NOTE — Telephone Encounter (Signed)
Thanks for the update. If he fails ORION, is it possible for him to re enter [facility] registry?

## 2016-07-04 ENCOUNTER — Other Ambulatory Visit: Payer: Medicare Other

## 2016-07-05 ENCOUNTER — Telehealth: Payer: Self-pay | Admitting: Physician Assistant

## 2016-07-05 NOTE — Telephone Encounter (Signed)
New message      Pt states he was in a cholesterol study with Marcelino DusterMichelle and wishes to talk to her .  He would not tell me what he wanted but he wanted Marcelino DusterMichelle only

## 2016-07-05 NOTE — Telephone Encounter (Signed)
Pt received a letter from Repatha Ready in the mail stating that he should ask his provider about coverage. The Repatha Ready program ended in 2016 - not sure why pt just received a letter from them. Pt also inquired about status for other clinical trials. He failed CLEAR due to elevated CPK but was being considered for ORION. Will follow up with research group to see if pt will qualify for ORION now that we are enrolling.

## 2016-09-05 ENCOUNTER — Encounter: Payer: Self-pay | Admitting: Physician Assistant

## 2016-09-05 ENCOUNTER — Encounter (INDEPENDENT_AMBULATORY_CARE_PROVIDER_SITE_OTHER): Payer: Self-pay

## 2016-09-05 ENCOUNTER — Ambulatory Visit (INDEPENDENT_AMBULATORY_CARE_PROVIDER_SITE_OTHER): Payer: Medicare Other | Admitting: Physician Assistant

## 2016-09-05 VITALS — BP 104/70 | HR 88 | Ht 68.5 in | Wt 215.2 lb

## 2016-09-05 DIAGNOSIS — I2510 Atherosclerotic heart disease of native coronary artery without angina pectoris: Secondary | ICD-10-CM | POA: Diagnosis not present

## 2016-09-05 DIAGNOSIS — I10 Essential (primary) hypertension: Secondary | ICD-10-CM

## 2016-09-05 DIAGNOSIS — I5032 Chronic diastolic (congestive) heart failure: Secondary | ICD-10-CM | POA: Diagnosis not present

## 2016-09-05 DIAGNOSIS — E785 Hyperlipidemia, unspecified: Secondary | ICD-10-CM | POA: Diagnosis not present

## 2016-09-05 LAB — BASIC METABOLIC PANEL
BUN / CREAT RATIO: 14 (ref 10–24)
BUN: 16 mg/dL (ref 8–27)
CO2: 25 mmol/L (ref 18–29)
CREATININE: 1.14 mg/dL (ref 0.76–1.27)
Calcium: 10.6 mg/dL — ABNORMAL HIGH (ref 8.6–10.2)
Chloride: 100 mmol/L (ref 96–106)
GFR, EST AFRICAN AMERICAN: 75 mL/min/{1.73_m2} (ref >59)
GFR, EST NON AFRICAN AMERICAN: 65 mL/min/{1.73_m2} (ref >59)
GLUCOSE: 127 mg/dL — AB (ref 65–99)
Potassium: 4 mmol/L (ref 3.5–5.2)
Sodium: 141 mmol/L (ref 134–144)

## 2016-09-05 LAB — LIPID PANEL
CHOL/HDL RATIO: 5.8 ratio — AB (ref 0.0–5.0)
CHOLESTEROL TOTAL: 228 mg/dL — AB (ref 100–199)
HDL: 39 mg/dL — ABNORMAL LOW (ref >39)
LDL CALC: 163 mg/dL — AB (ref 0–99)
Triglycerides: 131 mg/dL (ref 0–149)
VLDL CHOLESTEROL CAL: 26 mg/dL (ref 5–40)

## 2016-09-05 LAB — LDL CHOLESTEROL, DIRECT: LDL Direct: 182 mg/dL — ABNORMAL HIGH (ref 0–99)

## 2016-09-05 NOTE — Patient Instructions (Addendum)
Medication Instructions:  Your physician recommends that you continue on your current medications as directed. Please refer to the Current Medication list given to you today.   Labwork: TODAY:  BMET, LIPID PANEL, & DIRECT LDL  Testing/Procedures: None ordered  Follow-Up: Your physician wants you to follow-up in: 6 MONTHS WITH DR. Mayford KnifeURNER.  You will receive a reminder letter in the mail two months in advance. If you don't receive a letter, please call our office to schedule the follow-up appointment.    Any Other Special Instructions Will Be Listed Below (If Applicable).   If you need a refill on your cardiac medications before your next appointment, please call your pharmacy.

## 2016-09-05 NOTE — Progress Notes (Signed)
Cardiology Office Note    Date:  09/05/2016   ID:  Zephaniah, Lubrano [birth year], MRN [id number]  PCP:  Duane Lope, MD  Cardiologist: Dr.Turner  Chief Complaint  Patient presents with  . Follow-up    History of Present Illness:  PRANEEL HAISLEY is a 71 y.o. male with history of CADCABG 1993 (stenting of RCA, OM1, last cath 2012 with OM intervention), CABG 2015,HLD, essential HTN, elevated CPK (persistent after stopping statin), hyperparathyroidism who presents for follow-up. He was admitted 11/2015 with SOB, elevated BNP, mildly elevated troponin (0.09) in the setting of dietary indiscretions. 2D Echo 12/05/15: EF 55-60%, no RWMA, grade 3 diastolic dysfunction, mild-mod MR. Nuclear stress test was performed 12/07/15 showing no evidence of ischemia, + fixed defect c/w scar.  Patient comes in today for routine follow-up. He denies chest pain, palpitations, dizziness or presyncope. He does have some shortness of breath with activity but hasn't been exercising as much. He swims 3 days a week but is hoping to start walking again soon. He is limited secondary to plantar fasciitis. He is not on any cholesterol medications as he didn't qualify first study at [facility]. He said Repatha worked well for him but he can't afford it. Weight is up his last office visit but he says he overindulged to Christmas and gained some weight. Complains of recent upper respiratory infection with cough and congestion.       Past Medical History:  Diagnosis Date  . CAD (coronary artery disease), native coronary artery 03/21/2016   stenting of RCA, OM1, last cath 2012 with OM intervention  . Carpal tunnel syndrome    bilateral  . Cholelithiasis   . Chronic diastolic CHF (congestive heart failure) (HCC)   . Elevated CPK    w normal troponin felt due to statin use-muscle bx of L deltoid by rheum in the past, non diagnostic, stable CKs at 7-900 range since 1999; however, CK remained elevated despite statin cessation  .  Glaucoma   . Hepatic steatosis   . Hernia    umbilical  . Hyperlipidemia   . Hyperparathyroidism (HCC)   . Hypertension   . Plantar fasciitis   . Prostate infection     Past Surgical History:  Procedure Laterality Date  . CARPAL TUNNEL RELEASE     bilateral  . CHOLECYSTECTOMY  2015  . CORONARY ARTERY BYPASS GRAFT  1993  . heart stent  2002  . HERNIA REPAIR     umbilical  . MANDIBLE SURGERY      Current Medications: Outpatient Medications Prior to Visit  Medication Sig Dispense Refill  . allopurinol (ZYLOPRIM) 100 MG tablet Take 200 mg by mouth daily.     [name] aspirin 81 MG tablet Take 1 tablet (81 mg total) by mouth daily.    . colchicine 0.6 MG tablet Take 0.6 mg by mouth daily. GOUT    . diclofenac sodium (VOLTAREN) 1 % GEL APPLY 4 GRAMS ON AFFECTED SKIN 4 TIMES A DAY  0  . fluticasone (FLONASE) 50 MCG/ACT nasal spray Place 1 spray into both nostrils daily as needed for allergies.   2  . furosemide (LASIX) 40 MG tablet Take 1 tablet (40 mg total) by mouth daily. 30 tablet 1  . latanoprost (XALATAN) 0.005 % ophthalmic solution Place 1 drop into both eyes at bedtime.     [name] losartan (COZAAR) 100 MG tablet TAKE 1 TABLET BY MOUTH DAILY. 90 tablet 3  . Multiple Vitamins-Minerals (CENTRUM CARDIO PO) Take 1 tablet  by mouth at bedtime.     . nitroGLYCERIN (NITROLINGUAL) 0.4 MG/SPRAY spray Place 1 spray under the tongue every 5 (five) minutes x 3 doses as needed for chest pain. 25 g 3  . Omega 3 1000 MG CAPS Take 1 capsule by mouth at bedtime.      No facility-administered medications prior to visit.      Allergies:   Meloxicam; Methocarbamol; Statins; and Zetia [ezetimibe]   Social History   Social History  . Marital status: Widowed    Spouse name: N/A  . Number of children: N/A  . Years of education: N/A   Social History Main Topics  . Smoking status: Never Smoker  . Smokeless tobacco: Never Used  . Alcohol use No  . Drug use: No  . Sexual activity: Not Asked   Other  Topics Concern  . None   Social History Narrative  . None     Family History:  The patient's family history includes Heart disease in his father; Hyperlipidemia in his father and mother; Hypertension in his sister.   ROS:   Please see the history of present illness.    Review of Systems  Constitution: Negative.  HENT: Negative.   Cardiovascular: Positive for dyspnea on exertion.  Respiratory: Negative.   Endocrine: Negative.   Hematologic/Lymphatic: Negative.   Musculoskeletal: Negative.   Gastrointestinal: Negative.   Genitourinary: Negative.   Neurological: Negative.    All other systems reviewed and are negative.   PHYSICAL EXAM:   VS:  BP 104/70   Pulse 88   Ht 5' 8.5" (1.74 m)   Wt 215 lb 3.2 oz (97.6 kg)   BMI 32.25 kg/m   Physical Exam  GEN: Well nourished, well developed, in no acute distress  Neck: no JVD, carotid bruits, or masses Cardiac:RRR; positive S4, no murmurs, rubs Respiratory:  clear to auscultation bilaterally, normal work of breathing GI: soft, nontender, nondistended, + BS Ext: without cyanosis, clubbing, or edema, Good distal pulses bilaterally Psych: euthymic mood, full affect  Wt Readings from Last 3 Encounters:  09/05/16 215 lb 3.2 oz (97.6 kg)  03/29/16 209 lb (94.8 kg)  03/21/16 209 lb (94.8 kg)      Studies/Labs Reviewed:   EKG:  EKG is ordered today.  The ekg ordered today demonstrates Normal sinus rhythm with first-degree AV block, no acute change  Recent Labs: 12/04/2015: B Natriuretic Peptide 264.3 12/07/2015: Hemoglobin 15.2; Platelets 168 03/21/2016: BUN 18; Creat 1.00; Potassium 4.0; Sodium 140   Lipid Panel    Component Value Date/Time   CHOL 251 (H) 12/05/2015 1003   TRIG 110 12/05/2015 1003   HDL 30 (L) 12/05/2015 1003   CHOLHDL 8.4 12/05/2015 1003   VLDL 22 12/05/2015 1003   LDLCALC 199 (H) 12/05/2015 1003    Additional studies/ records that were reviewed today include:  2Decho 11/2015 Study Conclusions   -  Left ventricle: The cavity size was normal. Systolic function was   normal. The estimated ejection fraction was in the range of 55%   to 60%. Wall motion was normal; there were no regional wall   motion abnormalities. Doppler parameters are consistent with   elevated ventricular end-diastolic filling pressure. - Aortic valve: There was mild regurgitation. - Mitral valve: Some thickening of the anterior leaflet with   thickened chordal insertion. There was mild to moderate   regurgitation. - Left atrium: The atrium was mildly dilated. - Atrial septum: No defect or patent foramen ovale was identified.  Nuclear stress test  12/07/15 IMPRESSION: 1. No stress-induced perfusion defect to suggest stress-induced ischemia. There is a fixed defect at the inferolateral apex compatible with scar.   2. Global hypokinesis.   3. Left ventricular ejection fraction 36%   4. Non invasive risk stratification*: Intermediate    ASSESSMENT:    1. Chronic diastolic heart failure (HCC)   2. Essential hypertension, benign   3. Hyperlipidemia, unspecified hyperlipidemia type   4. Coronary artery disease involving native coronary artery of native heart without angina pectoris      PLAN:  In order of problems listed above:  Chronic diastolic heart failure well compensated on Lasix. We'll check labs today  Essential hypertension blood pressure on the low side the patient asymptomatic. Continue losartan  Hyperlipidemia patient is intolerant to statins with high CPKs. [facility] not qualify [facility]Spoke with Meagan Supple in the lipid clinic. Will draw fasting lipids today and submit paperwork to see if he qualifies for Repatha assistant.  CAD CABG 1993, stents to the RCA and OM 2012, no ischemia on nuclear stress test 12/07/15. Continue aspirin    Medication Adjustments/Labs and Tests Ordered: Current medicines are reviewed at length with the patient today.  Concerns regarding medicines are  outlined above.  Medication changes, Labs and Tests ordered today are listed in the Patient Instructions below. Patient Instructions  Medication Instructions:  Your physician recommends that you continue on your current medications as directed. Please refer to the Current Medication list given to you today.   Labwork: TODAY:  BMET, LIPID PANEL, & DIRECT LDL  Testing/Procedures: None ordered  Follow-Up: Your physician wants you to follow-up in: 6 MONTHS WITH DR. Mayford KnifeURNER.  You will receive a reminder letter in the mail two months in advance. If you don't receive a letter, please call our office to schedule the follow-up appointment.    Any Other Special Instructions Will Be Listed Below (If Applicable).   If you need a refill on your cardiac medications before your next appointment, please call your pharmacy.      Elson ClanSigned, Rodrickus Min, PA-C  09/05/2016 9:08 AM    [facility] [address], [place], [place]  [address] Phone: [phone]; Fax: [phone]

## 2016-09-06 ENCOUNTER — Telehealth: Payer: Self-pay | Admitting: Pharmacist

## 2016-09-06 NOTE — Telephone Encounter (Signed)
Prior authorization for Repatha submitted and approved today. Cost is prohibitive and pt can only start drug if he qualifies for copay assistance. LMOM for pt to return call - he was given [employer] application form yesterday at his visit with Herma CarsonMichelle Lenze and needs to drop this off so we can submit it.

## 2016-09-08 NOTE — Telephone Encounter (Signed)
LMOM again. 

## 2016-09-27 ENCOUNTER — Other Ambulatory Visit: Payer: Self-pay | Admitting: Cardiology

## 2016-09-29 NOTE — Telephone Encounter (Signed)
LMOM again. 

## 2016-10-26 ENCOUNTER — Telehealth: Payer: Self-pay | Admitting: Cardiology

## 2016-10-26 NOTE — Telephone Encounter (Signed)
Patient complains of BLE swelling for 2 weeks. He states his feet and ankles are swollen, but not too big for his shoes. He wears compression hose when he thinks to wear them. He states he limits his salt, but he does not limit his water consumption. He denies all other symptoms and denies weight gain (although he states he does not weigh daily).  He states he went to the rheumatologist yesterday and was told his swelling is not due to gout and was instructed to call Cardiology for recommendations. Confirmed with patient he is taking medications as instructed. Most recent BP = 120/65 Patient will take 1 extra Lasix 40 mg tonight. He awaits Dr. Norris Cross recommendations tomorrow.

## 2016-10-26 NOTE — Telephone Encounter (Signed)
New Message     Pt c/o swelling: STAT is pt has developed SOB within 24 hours  1. How long have you been experiencing swelling? 2 months   2. Where is the swelling located? Feet and ankles   3.  Are you currently taking a "fluid pill"? Yes   4.  Are you currently SOB? no  5.  Have you traveled recently? No    Pt wants to know if he should increase meds

## 2016-10-27 MED ORDER — FUROSEMIDE 40 MG PO TABS: 40.0000 mg | ORAL_TABLET | Freq: Every day | ORAL | 6 refills | Status: DC

## 2016-10-27 NOTE — Telephone Encounter (Signed)
Increase lasix to 40mg  BID for 3 days and then back to 40mg  daily and call with LE edema does not resolve

## 2016-10-27 NOTE — Telephone Encounter (Signed)
Instructed patient to INCREASE LASIX to 40 mg BID for 2 more days. He understands to call if LE edema does not resolve. He was grateful for call.

## 2016-11-10 NOTE — Telephone Encounter (Signed)
LMOM again. Will not attempt to contact pt further, he needs to call office to follow up with patient assistance if he is still interested.

## 2016-12-22 ENCOUNTER — Telehealth: Payer: Self-pay | Admitting: Pharmacist

## 2016-12-22 NOTE — Telephone Encounter (Signed)
Civil Service fast streamerCalled [employer] for an update on Repatha application - was advised that application was denied because patient qualifies for Low Income Subsidy through [employer]. Called pt to relay this message to him.  Low Income Subsidy information: pt can apply online at [email or web address]/extrahelp or can call Social Security at [phone] to start the process.   Advised him to call clinic once he has heard an update so that we can send in Repatha prescription.
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
## 2017-01-05 ENCOUNTER — Ambulatory Visit
Admission: RE | Admit: 2017-01-05 | Discharge: 2017-01-05 | Disposition: A | Discharge status: Home / Self Care | Payer: Medicare Other | Source: Ambulatory Visit | Attending: Physician Assistant | Admitting: Physician Assistant

## 2017-01-05 ENCOUNTER — Ambulatory Visit (INDEPENDENT_AMBULATORY_CARE_PROVIDER_SITE_OTHER): Payer: Medicare Other | Admitting: Physician Assistant

## 2017-01-05 ENCOUNTER — Encounter: Payer: Self-pay | Admitting: Physician Assistant

## 2017-01-05 ENCOUNTER — Encounter (INDEPENDENT_AMBULATORY_CARE_PROVIDER_SITE_OTHER): Payer: Self-pay

## 2017-01-05 VITALS — BP 114/72 | HR 83 | Ht 68.5 in | Wt 215.0 lb

## 2017-01-05 DIAGNOSIS — I10 Essential (primary) hypertension: Secondary | ICD-10-CM

## 2017-01-05 DIAGNOSIS — R739 Hyperglycemia, unspecified: Secondary | ICD-10-CM | POA: Diagnosis not present

## 2017-01-05 DIAGNOSIS — I2510 Atherosclerotic heart disease of native coronary artery without angina pectoris: Secondary | ICD-10-CM | POA: Diagnosis not present

## 2017-01-05 DIAGNOSIS — I5033 Acute on chronic diastolic (congestive) heart failure: Secondary | ICD-10-CM

## 2017-01-05 DIAGNOSIS — R05 Cough: Secondary | ICD-10-CM

## 2017-01-05 DIAGNOSIS — E785 Hyperlipidemia, unspecified: Secondary | ICD-10-CM

## 2017-01-05 DIAGNOSIS — R059 Cough, unspecified: Secondary | ICD-10-CM

## 2017-01-05 NOTE — Patient Instructions (Addendum)
Medication Instructions:  1. INCREASE LASIX TO 40 MG IN THE MORNING AND 20 MG IN THE PM FOR 5 DAYS; AFTER THE 5 DAYS YOU WILL RESUME LASIX 40 MG DAILY  Labwork: 1. TODAY BMET, PRO BNP, CBC W/DIFF  Testing/Procedures: CHEST X-RAY TODAY AT [facility] AT [address]  Follow-Up: 01/19/17 @ 10:30 WITH DAYNA DUNN, PAC   Any Other Special Instructions Will Be Listed Below (If Applicable).  For patients with congestive heart failure, we give them these special instructions:  1. Follow a low-salt diet - you are allowed no more than 2,000mg  of sodium per day. Watch your fluid intake. In general, you should not be taking in more than 2 liters of fluid per day (no more than 8 glasses per day). This includes sources of water in foods like soup, coffee, tea, milk, etc. 2. Weigh yourself on the same scale at same time of day and keep a log. 3. Call your doctor: (Anytime you feel any of the following symptoms)  - 3lb weight gain overnight or 5lb within a few days - Shortness of breath, with or without a dry hacking cough  - Swelling in the hands, feet or stomach  - If you have to sleep on extra pillows at night in order to breathe   IT IS IMPORTANT TO LET YOUR DOCTOR KNOW EARLY ON IF YOU ARE HAVING SYMPTOMS SO WE CAN HELP YOU!      If you need a refill on your cardiac medications before your next appointment, please call your pharmacy.

## 2017-01-06 ENCOUNTER — Telehealth: Payer: Self-pay | Admitting: Physician Assistant

## 2017-01-06 LAB — PRO B NATRIURETIC PEPTIDE: NT-Pro BNP: 400 pg/mL — ABNORMAL HIGH (ref 0–376)

## 2017-01-06 LAB — BASIC METABOLIC PANEL
BUN/Creatinine Ratio: 16 (ref 10–24)
BUN: 19 mg/dL (ref 8–27)
CALCIUM: 11 mg/dL — AB (ref 8.6–10.2)
CO2: 25 mmol/L (ref 20–29)
CREATININE: 1.19 mg/dL (ref 0.76–1.27)
Chloride: 101 mmol/L (ref 96–106)
GFR calc Af Amer: 71 mL/min/{1.73_m2} (ref >59)
GFR, EST NON AFRICAN AMERICAN: 61 mL/min/{1.73_m2} (ref >59)
Glucose: 87 mg/dL (ref 65–99)
Potassium: 4.2 mmol/L (ref 3.5–5.2)
SODIUM: 143 mmol/L (ref 134–144)

## 2017-01-06 LAB — CBC WITH DIFFERENTIAL/PLATELET
BASOS: 1 %
Basophils Absolute: 0 10*3/uL (ref 0.0–0.2)
EOS (ABSOLUTE): 0.2 10*3/uL (ref 0.0–0.4)
EOS: 3 %
HEMATOCRIT: 42.9 % (ref 37.5–51.0)
HEMOGLOBIN: 14.8 g/dL (ref 13.0–17.7)
IMMATURE GRANS (ABS): 0 10*3/uL (ref 0.0–0.1)
IMMATURE GRANULOCYTES: 0 %
LYMPHS: 23 %
Lymphocytes Absolute: 1.3 10*3/uL (ref 0.7–3.1)
MCH: 33.6 pg — ABNORMAL HIGH (ref 26.6–33.0)
MCHC: 34.5 g/dL (ref 31.5–35.7)
MCV: 98 fL — AB (ref 79–97)
Monocytes Absolute: 0.8 10*3/uL (ref 0.1–0.9)
Monocytes: 14 %
NEUTROS PCT: 59 %
Neutrophils Absolute: 3.5 10*3/uL (ref 1.4–7.0)
Platelets: 181 10*3/uL (ref 150–379)
RBC: 4.4 x10E6/uL (ref 4.14–5.80)
RDW: 14 % (ref 12.3–15.4)
WBC: 5.9 10*3/uL (ref 3.4–10.8)

## 2017-01-06 NOTE — Telephone Encounter (Signed)
Pt aware of the below information  Notes recorded by Laurann Montanaunn, Dayna N, PA-C on 01/05/2017 at 5:03 PM EDT Please let patient know CXR did not show any acute abnormality. Dayna Dunn PA-C

## 2017-01-06 NOTE — Telephone Encounter (Signed)
Called pt and left message for pt to call back for test results, per Ronie Spiesayna Dunn, PA.

## 2017-01-06 NOTE — Telephone Encounter (Signed)
New Message   pt verbalized that he is returning call for rn  For test results

## 2017-01-06 NOTE — Telephone Encounter (Signed)
-----   Message from Laurann Montanaayna N Dunn, [place]-C sent at 01/05/2017  5:03 PM EDT ----- Please let patient know CXR did not show any acute abnormality. Dayna Dunn PA-C
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
## 2017-01-19 ENCOUNTER — Ambulatory Visit (INDEPENDENT_AMBULATORY_CARE_PROVIDER_SITE_OTHER): Payer: Medicare Other | Admitting: Physician Assistant

## 2017-01-19 ENCOUNTER — Encounter: Payer: Self-pay | Admitting: Physician Assistant

## 2017-01-19 VITALS — BP 110/80 | HR 84 | Ht 68.5 in | Wt 218.0 lb

## 2017-01-19 DIAGNOSIS — I5032 Chronic diastolic (congestive) heart failure: Secondary | ICD-10-CM

## 2017-01-19 DIAGNOSIS — I10 Essential (primary) hypertension: Secondary | ICD-10-CM | POA: Diagnosis not present

## 2017-01-19 DIAGNOSIS — I2510 Atherosclerotic heart disease of native coronary artery without angina pectoris: Secondary | ICD-10-CM

## 2017-01-19 DIAGNOSIS — R05 Cough: Secondary | ICD-10-CM

## 2017-01-19 DIAGNOSIS — E785 Hyperlipidemia, unspecified: Secondary | ICD-10-CM | POA: Diagnosis not present

## 2017-01-19 DIAGNOSIS — R059 Cough, unspecified: Secondary | ICD-10-CM

## 2017-01-19 MED ORDER — OMEPRAZOLE 20 MG PO CPDR: 20.0000 mg | DELAYED_RELEASE_CAPSULE | Freq: Every day | ORAL | 1 refills | Status: DC

## 2017-01-19 MED ORDER — FUROSEMIDE 40 MG PO TABS: 40.0000 mg | ORAL_TABLET | Freq: Every day | ORAL | 6 refills | Status: DC

## 2017-01-19 NOTE — Patient Instructions (Addendum)
Medication Instructions: Your physician has recommended you make the following change in your medication:  -1) START Omeprazole (Prilosec) - Take 1 tablet by mouth daily  Labwork: None Ordered  Procedures/Testing: None Ordered  Follow-Up: Your physician recommends that you schedule a follow-up appointment in 3-4 MONTHS with Dr. Mayford Knifeurner   - You have been referred to a Nutritionist for Heart Failure/Coronary Artery Disease Diet as well as Pulmonology for your coughing.    If you need a refill on your cardiac medications before your next appointment, please call your pharmacy.

## 2017-01-24 ENCOUNTER — Encounter: Payer: Self-pay | Admitting: Internal Medicine

## 2017-01-24 ENCOUNTER — Other Ambulatory Visit (INDEPENDENT_AMBULATORY_CARE_PROVIDER_SITE_OTHER): Payer: Medicare Other

## 2017-01-24 ENCOUNTER — Ambulatory Visit (INDEPENDENT_AMBULATORY_CARE_PROVIDER_SITE_OTHER): Payer: Medicare Other | Admitting: Internal Medicine

## 2017-01-24 VITALS — BP 102/60 | HR 92 | Ht 68.5 in | Wt 219.8 lb

## 2017-01-24 DIAGNOSIS — R0609 Other forms of dyspnea: Secondary | ICD-10-CM

## 2017-01-24 DIAGNOSIS — R05 Cough: Secondary | ICD-10-CM

## 2017-01-24 DIAGNOSIS — R058 Other specified cough: Secondary | ICD-10-CM

## 2017-01-24 LAB — CBC WITH DIFFERENTIAL/PLATELET
Basophils Absolute: 0 10*3/uL (ref 0.0–0.1)
Basophils Relative: 0.8 % (ref 0.0–3.0)
EOS PCT: 3.4 % (ref 0.0–5.0)
Eosinophils Absolute: 0.2 10*3/uL (ref 0.0–0.7)
HCT: 40.9 % (ref 39.0–52.0)
Hemoglobin: 14 g/dL (ref 13.0–17.0)
LYMPHS ABS: 1 10*3/uL (ref 0.7–4.0)
Lymphocytes Relative: 22.3 % (ref 12.0–46.0)
MCHC: 34.2 g/dL (ref 30.0–36.0)
MCV: 100.7 fl — AB (ref 78.0–100.0)
MONOS PCT: 12.4 % — AB (ref 3.0–12.0)
Monocytes Absolute: 0.6 10*3/uL (ref 0.1–1.0)
Neutro Abs: 2.9 10*3/uL (ref 1.4–7.7)
Neutrophils Relative %: 61.1 % (ref 43.0–77.0)
Platelets: 189 10*3/uL (ref 150.0–400.0)
RBC: 4.06 Mil/uL — AB (ref 4.22–5.81)
RDW: 13.9 % (ref 11.5–15.5)
WBC: 4.7 10*3/uL (ref 4.0–10.5)

## 2017-01-24 LAB — BASIC METABOLIC PANEL
BUN: 13 mg/dL (ref 6–23)
CHLORIDE: 103 meq/L (ref 96–112)
CO2: 29 meq/L (ref 19–32)
Calcium: 10.7 mg/dL — ABNORMAL HIGH (ref 8.4–10.5)
Creatinine, Ser: 0.99 mg/dL (ref 0.40–1.50)
GFR: 95.77 mL/min (ref >60.00)
GLUCOSE: 126 mg/dL — AB (ref 70–99)
POTASSIUM: 3.7 meq/L (ref 3.5–5.1)
SODIUM: 138 meq/L (ref 135–145)

## 2017-01-24 NOTE — Progress Notes (Signed)
Subjective:     Patient ID: Stanley [name], male   DOB: [birth year],    MRN: [id number]  HPI   [age] yobm never smoker one episode of bad bronchitis in his 7440's but fully recovered and no problem with allergies asthma with new springtime rhinitis since age 71 started flonase for sneezing improved then  new cough developing in early June 2018 with sensation of throat tickling not assoc with typical springtime sneeze so referred to pulmonary clinic 01/24/2017 by Stanley [name] whose initial impression was worse chf    01/24/2017 1st [name] office visit/ Stanley [name]   Chief Complaint  Patient presents with  . Pulmonary Consult    Referred by Stanley Spiesayna Dunn, PA. Pt c/o cough x 6 wks. Cough is non prod. Cough sometimes wakes him from sleep. He also c/o wheezing.   was given prednisone by rheumatology x 15 days in June 2018  for mysositis but was already coughing at that point and pred made cough and breathing worse and also after finished pred noted a wheeze noct Cough onset followed symptoms of prgressive leg swelling x weeks  and cough is now  mostly daytime, wheeze is worse hs until props up and this problem also was proceeded by worse leg swelling  >  Seen by Annabelle Harmanana  01/05/17 with bnp 400 rx extra lasix some better p sev days of extra lasix  As was the leg swelling but it has still persisted  Started prilosec 20 mg daily x sev days prior to OV  But taking with bfast only   [facility] = can't walk a nl pace on a flat grade s sob but does fine slow and flat   No obvious day to day or daytime variability or assoc excess/ purulent sputum or mucus plugs or hemoptysis or cp or chest tightness, subjective wheeze or overt sinus or hb symptoms. No unusual exp hx or h/o childhood pna/ asthma or knowledge of premature birth.  Sleeping ok but now props at 30 degrees whereas prior to leg swelling could sleep flat fine  without nocturnal  or early am exacerbation  of respiratory  c/o's or need for noct saba. Also  denies any obvious fluctuation of symptoms with weather or environmental changes or other aggravating or alleviating factors except as outlined above   Current Medications, Allergies, Complete Past Medical History, Past Surgical History, Family History, and Social History were reviewed in [facility] electronic [facility].  ROS  The following are not active complaints unless bolded sore throat, dysphagia, dental problems, itching, sneezing,  nasal congestion or excess/ purulent secretions, ear ache,   fever, chills, sweats, unintended wt loss, classically pleuritic or exertional cp,  orthopnea pnd or leg swelling, presyncope, palpitations, abdominal pain, anorexia, nausea, vomiting, diarrhea  or change in bowel or bladder habits, change in stools or urine, dysuria,hematuria,  rash, arthralgias, visual complaints, headache, numbness, weakness or ataxia or problems with walking or coordination,  change in mood/affect or memory.            Review of Systems     Objective:   Physical Exam amb obese bm nad  Wt Readings from Last 3 Encounters:  01/24/17 219 lb 12.8 oz (99.7 kg)  01/19/17 218 lb (98.9 kg)  01/05/17 215 lb (97.5 kg)    Vital signs reviewed  - Note on arrival 02 sats  98% on RA     HEENT: nl dentition, turbinates bilaterally, and oropharynx. Nl external ear canals without cough  reflex   NECK :  without JVD/Nodes/TM/ nl carotid upstrokes bilaterally   LUNGS: no acc muscle use,  Nl contour chest with trace insp crackles in bases with cough at end insp    CV:  RRR  no s3 or murmur or increase in P2, and 2+ bilateral lower  edema   ABD:  Obese soft and nontender with nl inspiratory excursion in the supine position. No bruits or organomegaly appreciated, bowel sounds nl  MS:  Nl gait/ ext warm without deformities, calf tenderness, cyanosis or clubbing No obvious joint restrictions   SKIN: warm and dry without lesions    NEURO:  alert, approp, nl sensorium with   no motor or cerebellar deficits apparent.      I personally reviewed images and agree with radiology impression as follows:  CXR:   01/05/17 No active cardiopulmonary disease My review def cardiomegaly s overt chf   Labs ordered/ reviewed:      Chemistry      Component Value Date/Time   NA 138 01/24/2017 1447   NA 143 01/05/2017 1235   NA 142 04/24/2013 1323   K 3.7 01/24/2017 1447   K 3.9 04/24/2013 1323   CL 103 01/24/2017 1447   CO2 29 01/24/2017 1447   CO2 24 04/24/2013 1323   BUN 13 01/24/2017 1447   BUN 19 01/05/2017 1235   BUN 12.1 04/24/2013 1323   CREATININE 0.99 01/24/2017 1447   CREATININE 1.00 03/21/2016 0912   CREATININE 1.0 04/24/2013 1323      Component Value Date/Time   CALCIUM 10.7 (H) 01/24/2017 1447   CALCIUM 10.7 (H) 04/24/2013 1323   ALKPHOS 66 05/13/2015 0847   ALKPHOS 74 04/24/2013 1323   AST 45 (H) 05/13/2015 0847   AST 98 (H) 04/24/2013 1323   ALT 45 05/13/2015 0847   ALT 86 (H) 04/24/2013 1323   BILITOT 1.8 (H) 05/13/2015 0847   BILITOT 2.35 (H) 04/24/2013 1323        Lab Results  Component Value Date   WBC 4.7 01/24/2017   HGB 14.0 01/24/2017   HCT 40.9 01/24/2017   MCV 100.7 (H) 01/24/2017   PLT 189.0 01/24/2017       EOS                       0.2                                                                   01/24/2017       Lab Results  Component Value Date   TSH 1.47 06/06/2014     Lab Results  Component Value Date   PROBNP 400 (H) 01/05/2017      Labs ordered 01/24/2017   Allergy profile           Assessment:

## 2017-01-24 NOTE — Patient Instructions (Addendum)
Take 2 lasix daily x one week (or until the fluid in legs is about gone but don't exceed a week on two daily ) and if this corrects the cough and breathing then return to your heart doctors as your other medications may need to be adjusted further if this is cough proves due to fluid issues  Change prilosec Take  30- 60 min before your first and last meals of the day   GERD (REFLUX)  is an extremely common cause of respiratory symptoms just like yours , many times with no obvious heartburn at all.    It can be treated with medication, but also with lifestyle changes including elevation of the head of your bed (ideally with 6 inch  bed blocks),  Smoking cessation, avoidance of late meals, excessive alcohol, and avoid fatty foods, chocolate, peppermint, colas, red wine, and acidic juices such as orange juice.  NO MINT OR MENTHOL PRODUCTS SO NO COUGH DROPS   USE SUGARLESS CANDY INSTEAD (Jolley ranchers or Stover's or Life Savers) or even ice chips will also do - the key is to swallow to prevent all throat clearing. NO OIL BASED VITAMINS - use powdered substitutes - hold off Vit E and Fish oil until cough better  Please remember to go to the lab department downstairs in the basement  for your tests - we will call you with the results when they are available.  Pulmonary follow up is as needed

## 2017-01-25 LAB — RESPIRATORY ALLERGY PROFILE REGION II ~~LOC~~
ALLERGEN, D PTERNOYSSINUS, D1: 4.85 kU/L — AB
Allergen, A. alternata, m6: <0.1 kU/L
Allergen, C. Herbarum, M2: <0.1 kU/L
Allergen, Cedar tree, t12: <0.1 kU/L
Allergen, Cottonwood, t14: <0.1 kU/L
Allergen, Mouse Urine Protein, e78: <0.1 kU/L
Allergen, Mulberry, t76: <0.1 kU/L
Allergen, P. notatum, m1: <0.1 kU/L
Aspergillus fumigatus, m3: <0.1 kU/L
Bermuda Grass: <0.1 kU/L
Cockroach: 1.5 kU/L — ABNORMAL HIGH
Common Ragweed: <0.1 kU/L
D. FARINAE: 5.77 kU/L — AB
IGE (IMMUNOGLOBULIN E), SERUM: 124 kU/L — AB (ref <115)
Johnson Grass: <0.1 kU/L
Pecan/Hickory Tree IgE: <0.1 kU/L
Rough Pigweed  IgE: <0.1 kU/L
Timothy Grass: <0.1 kU/L

## 2017-01-25 NOTE — Assessment & Plan Note (Addendum)
The differential diagnosis of difficult to control airways disorders is extensive with no quick and easy answers but easy to remember because it consists of 13 A's,  Two Bs and one C: 1. Adherence, always a challenge and the leading suspect.  2. Acid reflux disease, with the greater proportion of pulmonary patients with no overt heartburn symptoms, and no easy way to treat non-acid reflux>  Change ppi to ac and diet reviewed 3. Ace inhibitor use,  But can also be seen with losartan For reasons that may related to vascular permability and nitric oxide pathways but not elevated  bradykinin levels (as seen with  ACEi use) losartan in the generic form has been reported now from mulitple sources  to cause a similar pattern of non-specific  upper airway symptoms as seen with acei.   This has not been reported with exposure to the other ARB's to date, so it seems reasonable for now to try either generic diovan or avapro if ARB needed or use an alternative class altogether.  See:  Dewayne HatchAnn Allergy Asthma Immunol  2008: 101: p 495-499  4. Active sinus dz, best addressed by a sinus ct 5. Active smoking,  n/a 6. Allergic diseases, usually with a hx dating back to childhood with prominent allergic rhinitis features in up 90% of pts> send profile 7. Aspiration, a perennial problem in the elderly or other patients at risk   n/a 8. Allergic Bronchopulmonary Aspergillosis, associated with IgE's in the thousands> send IgE 9. Alpha one Antitrypsin deficiency, a must screen in patients with chronic airflow obstruction syndromes out of proportion to smoking history.   N/a - very rare in African americans 10. Adverse effect of inhalers, especially DPI's and especially with poor inhaler technique   n/a 11 Anxiety, always a diagnosis of exclusion   n/a 12. A bunch of PE's ie moderately large clot burden, a few small ones peripherally can cause pleuritic cp syndromes but not unexplained dyspnea > have not excluded but aren't usually  confused with uacs  13 Anemia or Thyroid disorders, hgb ok but needs tsh if not improving  Two B's 1. Bronchiectasis:  Pos CT is the sine que non here 2  Beta blocker effects:  Coreg and Timolol use are pervasive in the adult population and both have significant spillover effects on the airways One C 1. Congestive heart failure,easily  ruled out now with BNP level of < 100 when symptomatic but his was 400 and last echo was abn and symptoms all started with leg swelling Echo 12/05/15  Left ventricle: The cavity size was normal. Systolic function was   normal. The estimated ejection fraction was in the range of 55%   to 60%. Wall motion was normal; there were no regional wall   motion abnormalities. Doppler parameters are consistent with   elevated ventricular end-diastolic filling pressure. - Aortic valve: There was mild regurgitation. - Mitral valve: Some thickening of the anterior leaflet with   thickened chordal insertion. There was mild to moderate   regurgitation. - Left atrium: The atrium was mildly dilated.   So rec double diuresis x one week and f/u with cards if better, with me if not  see avs for instructions unique to this ov

## 2017-01-25 NOTE — Assessment & Plan Note (Signed)
Allergy profile 01/24/2017 >  Eos 0. /  IgE   - rec trial of max gerd  01/24/2017   The most common causes of chronic cough in immunocompetent adults include the following: upper airway cough syndrome (UACS), previously referred to as postnasal drip syndrome (PNDS), which is caused by variety of rhinosinus conditions; (2) asthma; (3) GERD; (4) chronic bronchitis from cigarette smoking or other inhaled environmental irritants; (5) nonasthmatic eosinophilic bronchitis; and (6) bronchiectasis.   These conditions, singly or in combination, have accounted for up to 94% of the causes of chronic cough in prospective studies.   Other conditions have constituted no >6% of the causes in prospective studies These have included bronchogenic carcinoma, chronic interstitial pneumonia, sarcoidosis, left ventricular failure, ACEI-induced cough, and aspiration from a condition associated with pharyngeal dysfunction.    Chronic cough is often simultaneously caused by more than one condition. A single cause has been found from 38 to 82% of the time, multiple causes from 18 to 62%. Multiply caused cough has been the result of three diseases up to 42% of the time.   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   These are the same pts (now being labeled as having "irritable larynx syndrome" by some cough centers) who not infrequently  have a history of having failed to tolerate ace inhibitors( and occasionally generic losartan , which may prove to be the case here) ,  dry powder inhalers or biphosphonates or report having atypical/extraesophageal reflux symptoms that don't respond to standard doses of PPI  and are easily confused as having aecopd or asthma flares by even experienced allergists/ pulmonologists (myself included).   For now rec max rx for gerd and treat chf more aggressively then f/u here with sinus ct and trial of 1st gen h1 if needed   Total time devoted to counseling  > 50 % of initial 60 min office visit:  review case with pt/wife discussion of options/alternatives/ personally creating written customized instructions  in presence of pt  then going over those specific  Instructions directly with the pt including how to use all of the meds but in particular covering each new medication in detail and the difference between the maintenance= "automatic" meds and the prns using an action plan format for the latter (If this problem/symptom => do that organization reading Left to right).  Please see AVS from this visit for a full list of these instructions which I personally wrote for this pt and  are unique to this visit.

## 2017-01-25 NOTE — Progress Notes (Signed)
LMTCB

## 2017-01-25 NOTE — Assessment & Plan Note (Signed)
Body mass index is 32.93 kg/m.  -  trending back up off extra lasix Lab Results  Component Value Date   TSH 1.47 06/06/2014     Contributing to gerd risk/ doe/reviewed the need and the process to achieve and maintain neg calorie balance > defer f/u primary care including intermittently monitoring thyroid status

## 2017-01-26 ENCOUNTER — Telehealth: Payer: Self-pay | Admitting: Pharmacist

## 2017-01-26 ENCOUNTER — Telehealth: Payer: Self-pay | Admitting: Internal Medicine

## 2017-01-26 NOTE — Telephone Encounter (Signed)
Called pt to see if he had filled out Low Income Subsidy application yet for Repatha. Pt states he never received an application. Refer to 6/28 telephone encounter...already provided pt with instructions either telephone # or website to find application for LIS. He has not done either, provided him with the telephone number again. He states he will start the application.

## 2017-01-26 NOTE — Progress Notes (Signed)
Pt aware per 8.2.18 phone note

## 2017-01-26 NOTE — Telephone Encounter (Signed)
Per pt's chart:  Notes recorded by Christen Butteraskin, Leslie M, CMA on 01/25/2017 at 2:14 PM EDT [facility]  Notes recorded by Nyoka CowdenWert, Michael B, MD on 01/25/2017 at 1:32 PM EDT Call patient : Studies are c/w allergies to dust > cockroaches > rx is avoidance, no change in recs    Called spoke with patient and discussed lab results/recs as stated by MW.  Pt voiced his understanding and denied any questions/concerns.  Nothing further needed; will sign off.

## 2017-03-03 NOTE — Telephone Encounter (Signed)
Called pt again to f/u with LIS paperwork. Pt states it took a few hours to get the paperwork for social security. He states he has the application but has not filled it out yet. Encouraged pt to do so and return it to the social security office and explained to him that this is the only way we can start him on Repatha at an affordable price. He verbalized understanding.

## 2017-03-12 ENCOUNTER — Other Ambulatory Visit: Payer: Self-pay | Admitting: Physician Assistant

## 2017-03-21 ENCOUNTER — Ambulatory Visit: Payer: Medicare Other | Admitting: Cardiology

## 2017-03-30 ENCOUNTER — Encounter: Payer: Medicare Other | Attending: Cardiology | Admitting: Dietician

## 2017-03-30 ENCOUNTER — Encounter: Payer: Self-pay | Admitting: Dietician

## 2017-03-30 DIAGNOSIS — I5032 Chronic diastolic (congestive) heart failure: Secondary | ICD-10-CM | POA: Insufficient documentation

## 2017-03-30 DIAGNOSIS — I2510 Atherosclerotic heart disease of native coronary artery without angina pectoris: Secondary | ICD-10-CM | POA: Insufficient documentation

## 2017-03-30 DIAGNOSIS — Z713 Dietary counseling and surveillance: Secondary | ICD-10-CM | POA: Insufficient documentation

## 2017-03-30 DIAGNOSIS — I110 Hypertensive heart disease with heart failure: Secondary | ICD-10-CM | POA: Insufficient documentation

## 2017-03-30 DIAGNOSIS — E785 Hyperlipidemia, unspecified: Secondary | ICD-10-CM | POA: Diagnosis not present

## 2017-03-30 NOTE — Patient Instructions (Signed)
Continue your active lifestyle.  Avoid added salt at the table and in your cooking. Avoid processed meat such as hot dogs, bacon, and sausage. Read labels for sodium and fat (especially want low saturated fat). Avoid pickles.  Consider less meat and more vegetables. Season with herbs.  When eating out ask for food without added salt.  Plan ahead and consider looking at the menu prior to going to find foods with less sodium.  Avoid canned foods unless it is low sodium or rinsed.

## 2017-03-30 NOTE — Progress Notes (Signed)
  Medical Nutrition Therapy:  Appt start time: 1000 end time:  1100.  Assessment:  Primary concerns today: Patient is here today alone.  He was referred for CHF and CAD.  Other hx includes glaucoma, HTN, hyperlipidemia, stent.  He is to follow a low sodium diet and wants to know what to eat.  He has noticed a taste change and is more sensitive to salty taste.  He eats out at times but tries to be careful what to eat when out.  Hx also includes gout and also would like information regarding this.  He reports preferring a meat and potato diet rather than vegetables and choices that he has previously been taught that he needs.  Weight is generally 212-217 lbs and was 217 lbs today. Labs include: Cholesterol 228, HDL 39, LDL 163, Triglycerides 131 Patient states that he was part of the Repatha Study.  This worked well to decrease his lipids but he cannot afford it.  He does not tolerate statins.  Patient lives alone and currently has a friend visiting.  They share shopping and cooking.  He is retired from [employer].  MEDICATIONS: See list to include lasix.  DIETARY INTAKE: Tomatoes make him get a rash but he eats small amounts but no sauce.  He also does not tolerate grapefruit, OJ,    24-hr recall:  B ( AM): egg, bacon or sausage, wheat toast OR apple pie OR instant oatmeal   Snk ( AM) :none  L ( PM): grilled cheese or tuna sandwich, or [employer] Quarter Pounder sometimes with fries  Snk ( PM): peanuts or almonds (lightly salted or unsalted) OR LS potato chips, cookies D (PM): pork chops or beef or fish or chicken, vegetable, and starch (sweet potato, baked potato)  Snk ( PM): ice cream or similar to afternoon Beverages: water, regular soda (2 cans daily)  Recent physical activity: [facility] 3 times per [facility]  He currently has plantar fascitis.  Enjoys racket ball.  Used to be a Water quality scientist.  Progress Towards Goal(s):  In progress.   Nutritional Diagnosis:  NB-1.1  Food and nutrition-related knowledge deficit As related to low sodium, low fat diet for CHF/CAD.  As evidenced by diet hx and patient report.    Intervention:  Nutrition education related to a low sodium, low saturated fat diet.  Also discussed foods high in purine to avoid secondary to gout.  Encouraged continued activity.  Discussed option of decreased meat and increased vegetables. Discussed eating out, meal choices, label reading.  Continue your active lifestyle.  Avoid added salt at the table and in your cooking. Avoid processed meat such as hot dogs, bacon, and sausage. Read labels for sodium and fat (especially want low saturated fat). Avoid pickles.  Consider less meat and more vegetables. Season with herbs.  When eating out ask for food without added salt.  Plan ahead and consider looking at the menu prior to going to find foods with less sodium.  Avoid canned foods unless it is low sodium or rinsed.    Handouts given during visit include:  Low sodium nutrition therapy from [facility]  Cholesterol and Triglycerides  Low purine diet for gout  Saturated fat  Mailed him copy of book titles related to plant based eating and heart disease at his request.  Monitoring/Evaluation:  Dietary intake, exercise, label reading., and body weight prn.

## 2017-04-05 NOTE — Telephone Encounter (Signed)
LMOM for pt.  

## 2017-05-03 MED ORDER — EVOLOCUMAB 140 MG/ML ~~LOC~~ SOAJ: 1.0000 "pen " | SUBCUTANEOUS | 11 refills | Status: DC

## 2017-05-03 NOTE — Telephone Encounter (Signed)
Called pt to f/u with LIS paperwork. States he still has not filled out paperwork. He would like to resubmit rx for Repatha to see how much price has been lowered with recent price drop. Rx sent in and will f/u with patient.

## 2017-05-03 NOTE — Addendum Note (Signed)
Addended by: Tania Steinhauser E on: 05/03/2017 08:48 AM   Modules accepted: Orders

## 2017-05-09 NOTE — Telephone Encounter (Signed)
Even with new Repatha NDC, monthly copay is cost prohibitive at $258 per month. LMOM for pt to return call - will still need to fill out LIS paperwork that he was advised to do 3 months ago.

## 2017-05-17 ENCOUNTER — Encounter: Payer: Self-pay | Admitting: Cardiology

## 2017-05-17 ENCOUNTER — Ambulatory Visit: Payer: Medicare Other | Admitting: Cardiology

## 2017-05-17 ENCOUNTER — Encounter (INDEPENDENT_AMBULATORY_CARE_PROVIDER_SITE_OTHER): Payer: Self-pay

## 2017-05-17 VITALS — BP 110/84 | HR 94 | Ht 68.5 in | Wt 215.6 lb

## 2017-05-17 DIAGNOSIS — I10 Essential (primary) hypertension: Secondary | ICD-10-CM | POA: Diagnosis not present

## 2017-05-17 DIAGNOSIS — I5032 Chronic diastolic (congestive) heart failure: Secondary | ICD-10-CM

## 2017-05-17 DIAGNOSIS — E7800 Pure hypercholesterolemia, unspecified: Secondary | ICD-10-CM

## 2017-05-17 DIAGNOSIS — I2510 Atherosclerotic heart disease of native coronary artery without angina pectoris: Secondary | ICD-10-CM

## 2017-05-17 MED ORDER — FUROSEMIDE 40 MG PO TABS: 60.0000 mg | ORAL_TABLET | Freq: Every day | ORAL | 3 refills | Status: DC
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
## 2017-05-17 NOTE — Patient Instructions (Signed)
Medication Instructions:  Your physician has recommended you make the following change in your medication:   INCREASE: Lasix to 60 mg a day   Labwork: Your physician recommends that you return for lab work in: 1 week for basic metabolic panel.  Testing/Procedures: None ordered  Follow-Up: Your physician wants you to follow-up in: 6 months with Dr. Mayford Knifeurner.  You will receive a reminder letter in the mail two months in advance. If you don't receive a letter, please call our office to schedule the follow-up appointment.  Any Other Special Instructions Will Be Listed Below (If Applicable).     If you need a refill on your cardiac medications before your next appointment, please call your pharmacy.

## 2017-05-23 ENCOUNTER — Other Ambulatory Visit: Payer: Medicare Other

## 2017-05-23 DIAGNOSIS — I5032 Chronic diastolic (congestive) heart failure: Secondary | ICD-10-CM

## 2017-05-23 DIAGNOSIS — I2510 Atherosclerotic heart disease of native coronary artery without angina pectoris: Secondary | ICD-10-CM

## 2017-05-24 LAB — BASIC METABOLIC PANEL
BUN / CREAT RATIO: 14 (ref 10–24)
BUN: 16 mg/dL (ref 8–27)
CO2: 24 mmol/L (ref 20–29)
CREATININE: 1.15 mg/dL (ref 0.76–1.27)
Calcium: 10.6 mg/dL — ABNORMAL HIGH (ref 8.6–10.2)
Chloride: 103 mmol/L (ref 96–106)
GFR calc non Af Amer: 64 mL/min/{1.73_m2} (ref >59)
GFR, EST AFRICAN AMERICAN: 74 mL/min/{1.73_m2} (ref >59)
Glucose: 114 mg/dL — ABNORMAL HIGH (ref 65–99)
Potassium: 3.7 mmol/L (ref 3.5–5.2)
Sodium: 141 mmol/L (ref 134–144)

## 2017-07-10 MED FILL — SHINGRIX VIAL KIT: 50 | 1 days supply | Qty: 1 | Fill #0

## 2017-07-31 ENCOUNTER — Telehealth: Payer: Self-pay | Admitting: Cardiology

## 2017-07-31 NOTE — Telephone Encounter (Signed)
New message    Pt c/o swelling: STAT is pt has developed SOB within 24 hours  1) How much weight have you gained and in what time span? n/a  2) If swelling, where is the swelling located? Ankle, feet  3) Are you currently taking a fluid pill? yes  4) Are you currently SOB? No  5) Do you have a log of your daily weights (if so, list)? n/a  6) Have you gained 3 pounds in a day or 5 pounds in a week? n/a  Have you traveled recently? No

## 2017-07-31 NOTE — Telephone Encounter (Signed)
Left a detailed message informing patient to follow up with primary MD per Dr. Mayford Knifeurner.

## 2017-07-31 NOTE — Telephone Encounter (Signed)
Please have him see his PCP 

## 2017-07-31 NOTE — Telephone Encounter (Signed)
Patient c/o ankle swelling. Patient denies sob, cough or weight gain. Patient states he has pain in his feet and ankles with elevation. Patient also states he has plantar fascitis, he has a follow up with his orthopedic MD. Patient denied any changes in sodium or fluid intake. Patient states he is taking lasix 60 mg once a day. Patient is not wearing his compression socks due to the swelling, he was unable to get them on. Informed patient I would forward to Dr. Mayford Knifeurner for further recommendations. Patient verbalized understanding and thanked me for the call.

## 2017-07-31 NOTE — Telephone Encounter (Signed)
Left message to call back  

## 2017-08-03 ENCOUNTER — Telehealth: Payer: Self-pay | Admitting: Physician Assistant

## 2017-08-03 NOTE — Telephone Encounter (Signed)
Received fax from [employer] insurance company regarding caution for long term use of omeprazole. I originally prescribed this 12/2016 for #30 with 1 refill; appears to be automatically refilled per refill dept in 02/2017. Please ask [name] to discuss long-term use with his primary care doctor as long term use of this med can increase risk of osteoporosis, low magnesium, GI infections. Sometimes primary care opts to transition to long term Zantac or Pepcid instead. Further refills should come from primary care rather than our office.  Yatziri Wainwright PA-C

## 2017-08-03 NOTE — Telephone Encounter (Signed)
Called pt to let him know that we received a caution fax from his Insurance re: long term use of Omeprazole.  Pt was advised that if he needed it long term, he would need to contact his PCP re: precautions and have him manage that med, that it was accidentally refilled for 1 year here at the office, even though it was recommended he see PCP.  Pt thanked me for the information and stated that he would contact his PCP.

## 2017-08-04 ENCOUNTER — Telehealth: Payer: Self-pay | Admitting: Cardiology

## 2017-08-04 NOTE — Telephone Encounter (Signed)
New message     Pt c/o medication issue:  1. Name of Medication: allopurinol 100 mg and losartan 100 mg  2. How are you currently taking this medication (dosage and times per day)? daily  3. Are you having a reaction (difficulty breathing--STAT)? no  4. What is your medication issue? Pt states medication has been recalled and he is going to stop taking this medication.

## 2017-08-04 NOTE — Telephone Encounter (Signed)
Left message for pt to c/b to discuss. 

## 2017-08-07 NOTE — Telephone Encounter (Signed)
Left message to call back  
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
## 2017-08-15 ENCOUNTER — Ambulatory Visit: Payer: Medicare Other | Admitting: Physician Assistant

## 2017-08-15 ENCOUNTER — Other Ambulatory Visit: Payer: Self-pay | Admitting: Physician Assistant

## 2017-08-15 ENCOUNTER — Encounter: Payer: Self-pay | Admitting: Physician Assistant

## 2017-08-15 VITALS — BP 112/78 | HR 89 | Ht 66.5 in | Wt 207.0 lb

## 2017-08-15 DIAGNOSIS — R05 Cough: Secondary | ICD-10-CM

## 2017-08-15 DIAGNOSIS — I2510 Atherosclerotic heart disease of native coronary artery without angina pectoris: Secondary | ICD-10-CM | POA: Diagnosis not present

## 2017-08-15 DIAGNOSIS — I5032 Chronic diastolic (congestive) heart failure: Secondary | ICD-10-CM | POA: Diagnosis not present

## 2017-08-15 DIAGNOSIS — I10 Essential (primary) hypertension: Secondary | ICD-10-CM

## 2017-08-15 DIAGNOSIS — R059 Cough, unspecified: Secondary | ICD-10-CM

## 2017-08-15 DIAGNOSIS — R600 Localized edema: Secondary | ICD-10-CM

## 2017-08-15 DIAGNOSIS — E785 Hyperlipidemia, unspecified: Secondary | ICD-10-CM

## 2017-08-15 LAB — COMPREHENSIVE METABOLIC PANEL
A/G RATIO: 1.5 (ref 1.2–2.2)
ALBUMIN: 4.1 g/dL (ref 3.5–4.8)
ALK PHOS: 125 IU/L — AB (ref 39–117)
ALT: 36 IU/L (ref 0–44)
AST: 45 IU/L — ABNORMAL HIGH (ref 0–40)
BILIRUBIN TOTAL: 2.4 mg/dL — AB (ref 0.0–1.2)
BUN / CREAT RATIO: 12 (ref 10–24)
BUN: 13 mg/dL (ref 8–27)
CHLORIDE: 105 mmol/L (ref 96–106)
CO2: 24 mmol/L (ref 20–29)
Calcium: 10.5 mg/dL — ABNORMAL HIGH (ref 8.6–10.2)
Creatinine, Ser: 1.1 mg/dL (ref 0.76–1.27)
GFR calc Af Amer: 78 mL/min/{1.73_m2} (ref >59)
GFR calc non Af Amer: 67 mL/min/{1.73_m2} (ref >59)
GLOBULIN, TOTAL: 2.8 g/dL (ref 1.5–4.5)
Glucose: 128 mg/dL — ABNORMAL HIGH (ref 65–99)
POTASSIUM: 3.9 mmol/L (ref 3.5–5.2)
SODIUM: 144 mmol/L (ref 134–144)
Total Protein: 6.9 g/dL (ref 6.0–8.5)

## 2017-08-15 LAB — CBC
Hematocrit: 40.6 % (ref 37.5–51.0)
Hemoglobin: 14.1 g/dL (ref 13.0–17.7)
MCH: 33.3 pg — ABNORMAL HIGH (ref 26.6–33.0)
MCHC: 34.7 g/dL (ref 31.5–35.7)
MCV: 96 fL (ref 79–97)
PLATELETS: 183 10*3/uL (ref 150–379)
RBC: 4.23 x10E6/uL (ref 4.14–5.80)
RDW: 13.5 % (ref 12.3–15.4)
WBC: 4.1 10*3/uL (ref 3.4–10.8)

## 2017-08-15 LAB — PRO B NATRIURETIC PEPTIDE: NT-Pro BNP: 1168 pg/mL — ABNORMAL HIGH (ref 0–376)

## 2017-08-15 MED ORDER — FUROSEMIDE 40 MG PO TABS: 80.0000 mg | ORAL_TABLET | Freq: Every day | ORAL | 1 refills | Status: DC

## 2017-08-15 NOTE — Patient Instructions (Signed)
Medication Instructions:  Your physician has recommended you make the following change in your medication:  1. INCREASE the Lasix to 40 mg taking 2 tablets daily   Labwork: TODAY:  CMET, CBC, & PRO BNP  Testing/Procedures: None ordered  Follow-Up: Your physician recommends that you schedule a follow-up appointment in: 08/30/17 ARRIVE AT 10:45 TO SEE DAYNA DUNN, PA-C   Any Other Special Instructions Will Be Listed Below (If Applicable).     If you need a refill on your cardiac medications before your next appointment, please call your pharmacy.

## 2017-08-16 NOTE — Telephone Encounter (Signed)
Left message to call back. Have attempted to call the patient several times. Will close encounter.

## 2017-08-23 ENCOUNTER — Encounter: Payer: Self-pay | Admitting: Physician Assistant
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
## 2017-08-30 ENCOUNTER — Ambulatory Visit: Payer: Medicare Other | Admitting: Physician Assistant

## 2017-08-30 ENCOUNTER — Encounter: Payer: Self-pay | Admitting: Physician Assistant

## 2017-08-30 ENCOUNTER — Telehealth: Payer: Self-pay | Admitting: Physician Assistant

## 2017-08-30 VITALS — BP 120/78 | HR 91 | Ht 66.5 in | Wt 203.0 lb

## 2017-08-30 DIAGNOSIS — R945 Abnormal results of liver function studies: Secondary | ICD-10-CM | POA: Diagnosis not present

## 2017-08-30 DIAGNOSIS — I10 Essential (primary) hypertension: Secondary | ICD-10-CM

## 2017-08-30 DIAGNOSIS — R7989 Other specified abnormal findings of blood chemistry: Secondary | ICD-10-CM

## 2017-08-30 DIAGNOSIS — I2510 Atherosclerotic heart disease of native coronary artery without angina pectoris: Secondary | ICD-10-CM | POA: Diagnosis not present

## 2017-08-30 DIAGNOSIS — I5033 Acute on chronic diastolic (congestive) heart failure: Secondary | ICD-10-CM | POA: Diagnosis not present

## 2017-08-30 LAB — COMPREHENSIVE METABOLIC PANEL
A/G RATIO: 1.3 (ref 1.2–2.2)
ALT: 33 IU/L (ref 0–44)
AST: 56 IU/L — ABNORMAL HIGH (ref 0–40)
Albumin: 4.2 g/dL (ref 3.5–4.8)
Alkaline Phosphatase: 134 IU/L — ABNORMAL HIGH (ref 39–117)
BUN/Creatinine Ratio: 13 (ref 10–24)
BUN: 15 mg/dL (ref 8–27)
Bilirubin Total: 2.2 mg/dL — ABNORMAL HIGH (ref 0.0–1.2)
CALCIUM: 11 mg/dL — AB (ref 8.6–10.2)
CO2: 27 mmol/L (ref 20–29)
CREATININE: 1.18 mg/dL (ref 0.76–1.27)
Chloride: 98 mmol/L (ref 96–106)
GFR calc Af Amer: 71 mL/min/{1.73_m2} (ref >59)
GFR, EST NON AFRICAN AMERICAN: 62 mL/min/{1.73_m2} (ref >59)
GLOBULIN, TOTAL: 3.2 g/dL (ref 1.5–4.5)
Glucose: 130 mg/dL — ABNORMAL HIGH (ref 65–99)
POTASSIUM: 3.7 mmol/L (ref 3.5–5.2)
SODIUM: 140 mmol/L (ref 134–144)
TOTAL PROTEIN: 7.4 g/dL (ref 6.0–8.5)

## 2017-08-30 NOTE — Patient Instructions (Addendum)
Medication Instructions:  Your physician recommends that you continue on your current medications as directed. Please refer to the Current Medication list given to you today.  Labwork: TODAY:  CMET  Testing/Procedures: None ordered  Follow-Up: Your physician wants you to follow-up in: 6 MONTHS WITH DR. Sherlyn LickURNER   You will receive a reminder letter in the mail two months in advance. If you don't receive a letter, please call our office to schedule the follow-up appointment.  Any Other Special Instructions Will Be Listed Below (If Applicable).     If you need a refill on your cardiac medications before your next appointment, please call your pharmacy.

## 2017-08-30 NOTE — Telephone Encounter (Signed)
New message ° ° ° °Patient calling for lab results °

## 2017-08-31 NOTE — Telephone Encounter (Signed)
-----   Message from Laurann Montanaayna N Dunn, [place] sent at 08/30/2017  4:24 PM EST ----- Please let patient know labs were stable and he should continue current dose of Lasix - however, his liver function remains slightly abnormal for unclear reasons especially since cardiac symptoms have greatly improved. Please send copy to PCP and ask patient to discuss further eval with primary care. Should hold off omega 3 or any new tylenol/NSAIDS until this is evaluated. Dayna Dunn PA-C

## 2017-08-31 NOTE — Telephone Encounter (Signed)
Returned call, phone line is busy

## 2017-08-31 NOTE — Telephone Encounter (Signed)
Follow Up Call.   Stanley [name] is returning a call about his test results . Ask that you leave the results on his answering machine please . Thanks

## 2017-08-31 NOTE — Telephone Encounter (Signed)
Returned pts call several times this morning, and his line is busy.

## 2017-09-01 NOTE — Telephone Encounter (Signed)
Spoke with patient. Informed patient of Stanley [name]'s recommendation and instructed patient to continue with lasix and follow up with pcp regarding liver function. Patient verbalized understanding and thankful for the call.

## 2017-09-11 MED FILL — SHINGRIX VIAL KIT: 50 | 1 days supply | Qty: 1 | Fill #1

## 2017-10-06 ENCOUNTER — Ambulatory Visit: Payer: Medicare Other | Admitting: Podiatry

## 2017-10-06 ENCOUNTER — Encounter: Payer: Self-pay | Admitting: Podiatry

## 2017-10-06 ENCOUNTER — Ambulatory Visit (INDEPENDENT_AMBULATORY_CARE_PROVIDER_SITE_OTHER): Payer: Medicare Other

## 2017-10-06 DIAGNOSIS — M84375A Stress fracture, left foot, initial encounter for fracture: Secondary | ICD-10-CM | POA: Diagnosis not present

## 2017-10-06 DIAGNOSIS — M2041 Other hammer toe(s) (acquired), right foot: Secondary | ICD-10-CM

## 2017-10-06 DIAGNOSIS — M79674 Pain in right toe(s): Secondary | ICD-10-CM | POA: Diagnosis not present

## 2017-10-06 DIAGNOSIS — L601 Onycholysis: Secondary | ICD-10-CM | POA: Diagnosis not present

## 2017-10-06 DIAGNOSIS — M2042 Other hammer toe(s) (acquired), left foot: Secondary | ICD-10-CM | POA: Diagnosis not present

## 2017-10-06 DIAGNOSIS — B351 Tinea unguium: Secondary | ICD-10-CM

## 2017-10-06 DIAGNOSIS — M79675 Pain in left toe(s): Secondary | ICD-10-CM
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
## 2017-10-20 ENCOUNTER — Ambulatory Visit (INDEPENDENT_AMBULATORY_CARE_PROVIDER_SITE_OTHER): Payer: Medicare Other

## 2017-10-20 ENCOUNTER — Ambulatory Visit: Payer: Medicare Other | Admitting: Podiatry

## 2017-10-20 DIAGNOSIS — M84375A Stress fracture, left foot, initial encounter for fracture: Secondary | ICD-10-CM | POA: Diagnosis not present

## 2017-10-20 DIAGNOSIS — I739 Peripheral vascular disease, unspecified: Secondary | ICD-10-CM | POA: Diagnosis not present

## 2017-10-20 DIAGNOSIS — R609 Edema, unspecified: Secondary | ICD-10-CM | POA: Diagnosis not present

## 2017-10-20 NOTE — Progress Notes (Signed)
Subjective: Stanley [name] presents the office today for follow-up evaluation of possible stress fracture to his left foot.  He states that the pain is doing much better that he was having.  He still concerned that he gets swelling to his feet.  He denies any recent changes for last saw him denies any redness or warmth.  He has no new concerns. Denies any systemic complaints such as fevers, chills, nausea, vomiting. No acute changes since last appointment, and no other complaints at this time.   Objective: AAO x3, NAD DP/PT pulses palpable bilaterally, CRT less than 3 seconds There is mild diffuse swelling to bilateral lower extremities mild pitting there is no erythema or increase in warmth.  There is no pain to the ankle or other areas.  No open lesions.  At this time there is no area pinpoint bony tenderness or pain to vibratory sensation of bilateral lower extremities specifically on exam to the left foot there is no tenderness to palpation along the third metatarsal No open lesions or pre-ulcerative lesions.  No pain with calf compression, swelling, warmth, erythema  Assessment: Resolved left foot pain however concern for continued swelling which is been ongoing prior to the foot pain  Plan: -All treatment options discussed with the patient including all alternatives, risks, complications.  -Repeat x-rays were obtained and reviewed there is no definitive evidence of acute fracture or stress fracture.  Because of this we can start to transition to regular shoe as tolerable.  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  Because of this I will order an arterial duplex and consultation -RTC 9 weeks or after circulation test or sooner if needed -Patient encouraged to call the office with any questions, concerns, change in symptoms.   Vivi BarrackMatthew R Wagoner DPM

## 2017-10-24 ENCOUNTER — Telehealth: Payer: Self-pay | Admitting: *Deleted

## 2017-10-24 ENCOUNTER — Other Ambulatory Visit: Payer: Self-pay | Admitting: Podiatry

## 2017-10-24 DIAGNOSIS — I739 Peripheral vascular disease, unspecified: Secondary | ICD-10-CM

## 2017-10-24 DIAGNOSIS — R0989 Other specified symptoms and signs involving the circulatory and respiratory systems: Secondary | ICD-10-CM

## 2017-10-24 NOTE — Telephone Encounter (Signed)
-----   Message from Vivi Barrack, DPM sent at 10/23/2017  6:38 PM EDT ----- Please order arterial duplex and consultation given bilateral leg swelling as well as abnormal ABI in the office?  Thank you

## 2017-10-24 NOTE — Telephone Encounter (Signed)
Faxed orders for arterial dopplers and referral, clinical and demographics to [facility].

## 2017-10-25 ENCOUNTER — Ambulatory Visit (HOSPITAL_COMMUNITY)
Admission: RE | Admit: 2017-10-25 | Discharge: 2017-10-25 | Disposition: A | Discharge status: Home / Self Care | Payer: Medicare Other | Source: Ambulatory Visit | Attending: Cardiovascular Disease | Admitting: Cardiovascular Disease

## 2017-10-25 DIAGNOSIS — I771 Stricture of artery: Secondary | ICD-10-CM | POA: Diagnosis not present

## 2017-10-25 DIAGNOSIS — R0989 Other specified symptoms and signs involving the circulatory and respiratory systems: Secondary | ICD-10-CM | POA: Diagnosis not present

## 2017-10-25 DIAGNOSIS — I739 Peripheral vascular disease, unspecified: Secondary | ICD-10-CM | POA: Diagnosis not present

## 2017-10-25 DIAGNOSIS — R9439 Abnormal result of other cardiovascular function study: Secondary | ICD-10-CM | POA: Insufficient documentation

## 2017-10-26 ENCOUNTER — Ambulatory Visit: Payer: Medicare Other | Admitting: Cardiovascular Disease

## 2017-10-26 ENCOUNTER — Encounter: Payer: Self-pay | Admitting: Cardiovascular Disease

## 2017-10-26 DIAGNOSIS — I739 Peripheral vascular disease, unspecified: Secondary | ICD-10-CM | POA: Insufficient documentation

## 2017-10-26 NOTE — Progress Notes (Signed)
10/26/2017 Stanley [name]   12/30/1945  [id number]  Primary Physician [name], MD Primary Cardiologist: Runell Gess MD Nicholes Calamity, [place]  HPI:  Stanley [name] is a 72 y.o. moderately overweight African-American male patient of Dr. Gabriel Rung and Dr. Norris Cross referred for peripheral vascular evaluation because of right foot pain and absent pedal pulses on exam. He does have a history of CAD and diastolic heart failure as well as hypertension and hyperlipidemia. He also has plantar fasciitis. He has chronic lower extremity edema on high-dose diuretics and fluid restriction with diastolic dysfunction. He does complain of some right foot pain. Also has a history of plantar fasciitis. Recent Dopplers performed 10/26/17 revealed a right ABI 0.83 with an occluded right anterior tibial and high-grade right posterior tibial artery stenosis although I can't palpate a posterior tibial pulse on exam.    Current Meds  Medication Sig  . aspirin 81 MG tablet Take 1 tablet (81 mg total) by mouth daily.  . colchicine 0.6 MG tablet Take 0.6 mg by mouth daily. GOUT  . diclofenac sodium (VOLTAREN) 1 % GEL APPLY 4 GRAMS ON AFFECTED SKIN 4 TIMES A DAY  . fluticasone (FLONASE) 50 MCG/ACT nasal spray Place 1 spray into both nostrils daily as needed for allergies.   . furosemide (LASIX) 40 MG tablet Take 2 tablets (80 mg total) by mouth daily.  [name] latanoprost (XALATAN) 0.005 % ophthalmic solution Place 1 drop into both eyes at bedtime.   . Multiple Vitamins-Minerals (CENTRUM CARDIO PO) Take 1 tablet by mouth at bedtime.   . nitroGLYCERIN (NITROLINGUAL) 0.4 MG/SPRAY spray Place 1 spray under the tongue every 5 (five) minutes x 3 doses as needed for chest pain.  [name] omeprazole (PRILOSEC) 20 MG capsule TAKE 1 CAPSULE BY MOUTH EVERY DAY     Allergies  Allergen Reactions  . Meloxicam Other (See Comments)    Joint aching   . Methocarbamol Other (See Comments)    UNKNOWN TO PATIENT  . Prednisone  Shortness Of Breath  . Statins Other (See Comments) and Anaphylaxis    Myalgias, even with Crestor once weekly  . Zetia [Ezetimibe] Other (See Comments)    Full body myalgias  . Losartan     Possible contribution to dry cough    Social History   Socioeconomic History  . Marital status: Widowed    Spouse name: Not on file  . Number of children: Not on file  . Years of education: Not on file  . Highest education level: Not on file  Occupational History  . Not on file  Social Needs  . Financial resource strain: Not on file  . Food insecurity:    Worry: Not on file    Inability: Not on file  . Transportation needs:    Medical: Not on file    Non-medical: Not on file  Tobacco Use  . Smoking status: Never Smoker  . Smokeless tobacco: Never Used  Substance and Sexual Activity  . Alcohol use: No  . Drug use: No  . Sexual activity: Not on file  Lifestyle  . Physical activity:    Days per week: Not on file    Minutes per session: Not on file  . Stress: Not on file  Relationships  . Social connections:    Talks on phone: Not on file    Gets together: Not on file    Attends religious service: Not on file    Active member of club or  organization: Not on file    Attends meetings of clubs or organizations: Not on file    Relationship status: Not on file  . Intimate partner violence:    Fear of current or ex partner: Not on file    Emotionally abused: Not on file    Physically abused: Not on file    Forced sexual activity: Not on file  Other Topics Concern  . Not on file  Social History Narrative  . Not on file     Review of Systems: General: negative for chills, fever, night sweats or weight changes.  Cardiovascular: negative for chest pain, dyspnea on exertion, edema, orthopnea, palpitations, paroxysmal nocturnal dyspnea or shortness of breath Dermatological: negative for rash Respiratory: negative for cough or wheezing Urologic: negative for hematuria Abdominal:  negative for nausea, vomiting, diarrhea, bright red blood per rectum, melena, or hematemesis Neurologic: negative for visual changes, syncope, or dizziness All other systems reviewed and are otherwise negative except as noted above.    Blood pressure 109/76, pulse 86, height 5' 6.5" (1.689 m), weight 200 lb 12.8 oz (91.1 kg), SpO2 97 %.  General appearance: alert and no distress Neck: no adenopathy, no carotid bruit, no JVD, supple, symmetrical, trachea midline and thyroid not enlarged, symmetric, no tenderness/mass/nodules Lungs: clear to auscultation bilaterally Heart: regular rate and rhythm, S1, S2 normal, no murmur, click, rub or gallop Extremities: there was 1+ pitting edema bilaterally Pulses: absent right dorsalis pedis but palpable posterior tibial artery. Skin: Skin color, texture, turgor normal. No rashes or lesions Neurologic: Alert and oriented X 3, normal strength and tone. Normal symmetric reflexes. Normal coordination and gait  EKG not performed today  ASSESSMENT AND PLAN:   Peripheral arterial disease [facility]) Stanley [name] was referred by Dr. Ardelle Anton for evaluation because of right foot pain and absent pedal pulses on exam. He is a patient of Dr. Norris Cross with known CAD and diastolic heart failure with chronic lower extremity edema. He does have plantar fascia fasciitis. He complains of some chronic right foot pain really denies claudication. I can't palpate a posterior tibial pulse on the right. He recently had Dopplers in our office 10/26/17 which showed an occluded right anterior tibial had high-grade right posterior tibial artery stenosis the right ABI 0.83. There is no evidence of critical limb ischemia. At this point, I do not see an indication for intervention.      Runell Gess MD FACP,FACC,FAHA, [facility] 10/26/2017 2:15 PM

## 2017-10-26 NOTE — Patient Instructions (Signed)
Medication Instructions: Your physician recommends that you continue on your current medications as directed. Please refer to the Current Medication list given to you today.   Follow-Up: Your physician recommends that you schedule a follow-up appointment as needed with Dr. Berry.    

## 2017-10-26 NOTE — Assessment & Plan Note (Addendum)
Stanley [name] was referred by Dr. Ardelle Anton for evaluation because of right foot pain and absent pedal pulses on exam. He is a patient of Dr. Norris Cross with known CAD and diastolic heart failure with chronic lower extremity edema. He does have plantar fascia fasciitis. He complains of some chronic right foot pain really denies claudication. I can't palpate a posterior tibial pulse on the right. He recently had Dopplers in our office 10/26/17 which showed an occluded right anterior tibial had high-grade right posterior tibial artery stenosis the right ABI 0.83. There is no evidence of critical limb ischemia. At this point, I do not see an indication for intervention.

## 2017-12-01 ENCOUNTER — Telehealth: Payer: Self-pay | Admitting: Cardiology

## 2017-12-01 NOTE — Telephone Encounter (Signed)
Spoke with patient who is becoming extremely SOB and has swelling with no CP.  He takes Lasix 80 mg daily.  He saw his PCP on 6/6 who recommended that he see his cardiologist to address his CHF and SOB.  I was able to make him an appt on 6/11.  He verbalized understanding and was appreciative.

## 2017-12-01 NOTE — Telephone Encounter (Signed)
New Message    Pt c/o swelling: STAT is pt has developed SOB within 24 hours  1) How much weight have you gained and in what time span? unknown  2) If swelling, where is the swelling located? Legs and feet  3) Are you currently taking a fluid pill? yes  4) Are you currently SOB? yes  5) Do you have a log of your daily weights (if so, list)? no  6) Have you gained 3 pounds in a day or 5 pounds in a week? no  7) Have you traveled recently? No    Received after hours message that patient had called on 06/19 at 8:25pm wanting to make an appointment. I called patient and he advised his PCP wanted him to speak with cardiologist about his SOB and swelling. I can hear the SOB.
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
## 2017-12-05 ENCOUNTER — Ambulatory Visit: Payer: Medicare Other | Admitting: Physician Assistant

## 2017-12-05 ENCOUNTER — Encounter: Payer: Self-pay | Admitting: Physician Assistant

## 2017-12-05 VITALS — BP 104/72 | HR 96 | Ht 66.5 in | Wt 200.0 lb

## 2017-12-05 DIAGNOSIS — I5033 Acute on chronic diastolic (congestive) heart failure: Secondary | ICD-10-CM

## 2017-12-05 DIAGNOSIS — I2510 Atherosclerotic heart disease of native coronary artery without angina pectoris: Secondary | ICD-10-CM | POA: Diagnosis not present

## 2017-12-05 DIAGNOSIS — I10 Essential (primary) hypertension: Secondary | ICD-10-CM | POA: Diagnosis not present

## 2017-12-05 DIAGNOSIS — E7800 Pure hypercholesterolemia, unspecified: Secondary | ICD-10-CM | POA: Diagnosis not present

## 2017-12-05 DIAGNOSIS — I739 Peripheral vascular disease, unspecified: Secondary | ICD-10-CM

## 2017-12-05 LAB — BASIC METABOLIC PANEL
BUN / CREAT RATIO: 12 (ref 10–24)
BUN: 16 mg/dL (ref 8–27)
CALCIUM: 10.6 mg/dL — AB (ref 8.6–10.2)
CHLORIDE: 101 mmol/L (ref 96–106)
CO2: 25 mmol/L (ref 20–29)
Creatinine, Ser: 1.35 mg/dL — ABNORMAL HIGH (ref 0.76–1.27)
GFR, EST AFRICAN AMERICAN: 60 mL/min/{1.73_m2} (ref >59)
GFR, EST NON AFRICAN AMERICAN: 52 mL/min/{1.73_m2} — AB (ref >59)
Glucose: 114 mg/dL — ABNORMAL HIGH (ref 65–99)
Potassium: 3.7 mmol/L (ref 3.5–5.2)
SODIUM: 144 mmol/L (ref 134–144)

## 2017-12-05 LAB — PRO B NATRIURETIC PEPTIDE: NT-Pro BNP: 1776 pg/mL — ABNORMAL HIGH (ref 0–376)

## 2017-12-05 MED ORDER — FUROSEMIDE 40 MG PO TABS: ORAL_TABLET | ORAL | 3 refills | Status: DC

## 2017-12-05 NOTE — Patient Instructions (Addendum)
Medication Instructions:  Your physician has recommended you make the following change in your medication:  1.  INCREASE the Lasix to 40 mg taking 2 tablets by mouth twice a day for 3 days only then go back down to 1 tablet by mouth twice daily  Labwork: TODAY:  PRO BNP & BMET  Testing/Procedures: Your physician has requested that you have an echocardiogram BEFORE 12/19/17). Echocardiography is a painless test that uses sound waves to create images of your heart. It provides your doctor with information about the size and shape of your heart and how well your heart's chambers and valves are working. This procedure takes approximately one hour. There are no restrictions for this procedure.    Follow-Up: Your physician recommends that you schedule a follow-up appointment in: 12/19/17 ARRIVE AT 11:45 TO SEE MICHELE LENZE, PA-C   Any Other Special Instructions Will Be Listed Below (If Applicable).  DASH Eating Plan DASH stands for "Dietary Approaches to Stop Hypertension." The DASH eating plan is a healthy eating plan that has been shown to reduce high blood pressure (hypertension). It may also reduce your risk for type 2 diabetes, heart disease, and stroke. The DASH eating plan may also help with weight loss. What are tips for following this plan? General guidelines  Avoid eating more than 2,000 mg (milligrams) of salt (sodium) a day. If you have hypertension, you may need to reduce your sodium intake to 1,500 mg a day.  Limit alcohol intake to no more than 1 drink a day for nonpregnant women and 2 drinks a day for men. One drink equals 12 oz of beer, 5 oz of wine, or 1 oz of hard liquor.  Work with your health care provider to maintain a healthy body weight or to lose weight. Ask what an ideal weight is for you.  Get at least 30 minutes of exercise that causes your heart to beat faster (aerobic exercise) most days of the week. Activities may include walking, swimming, or biking.  Work  with your health care provider or diet and nutrition specialist (dietitian) to adjust your eating plan to your individual calorie needs. Reading food labels  Check food labels for the amount of sodium per serving. Choose foods with less than 5 percent of the Daily Value of sodium. Generally, foods with less than 300 mg of sodium per serving fit into this eating plan.  To find whole grains, look for the word "whole" as the first word in the ingredient list. Shopping  Buy products labeled as "low-sodium" or "no salt added."  Buy fresh foods. Avoid canned foods and premade or frozen meals. Cooking  Avoid adding salt when cooking. Use salt-free seasonings or herbs instead of table salt or sea salt. Check with your health care provider or pharmacist before using salt substitutes.  Do not fry foods. Cook foods using healthy methods such as baking, boiling, grilling, and broiling instead.  Cook with heart-healthy oils, such as olive, canola, soybean, or sunflower oil. Meal planning   Eat a balanced diet that includes: ? 5 or more servings of fruits and vegetables each day. At each meal, try to fill half of your plate with fruits and vegetables. ? Up to 6-8 servings of whole grains each day. ? Less than 6 oz of lean meat, poultry, or fish each day. A 3-oz serving of meat is about the same size as a deck of cards. One egg equals 1 oz. ? 2 servings of low-fat dairy each day. ? A  serving of nuts, seeds, or beans 5 times each week. ? Heart-healthy fats. Healthy fats called Omega-3 fatty acids are found in foods such as flaxseeds and coldwater fish, like sardines, salmon, and mackerel.  Limit how much you eat of the following: ? Canned or prepackaged foods. ? Food that is high in trans fat, such as fried foods. ? Food that is high in saturated fat, such as fatty meat. ? Sweets, desserts, sugary drinks, and other foods with added sugar. ? Full-fat dairy products.  Do not salt foods before  eating.  Try to eat at least 2 vegetarian meals each week.  Eat more home-cooked food and less restaurant, buffet, and fast food.  When eating at a restaurant, ask that your food be prepared with less salt or no salt, if possible. What foods are recommended? The items listed may not be a complete list. Talk with your dietitian about what dietary choices are best for you. Grains Whole-grain or whole-wheat bread. Whole-grain or whole-wheat pasta. Brown rice. Orpah Cobb. Bulgur. Whole-grain and low-sodium cereals. Pita bread. Low-fat, low-sodium crackers. Whole-wheat flour tortillas. Vegetables Fresh or frozen vegetables (raw, steamed, roasted, or grilled). Low-sodium or reduced-sodium tomato and vegetable juice. Low-sodium or reduced-sodium tomato sauce and tomato paste. Low-sodium or reduced-sodium canned vegetables. Fruits All fresh, dried, or frozen fruit. Canned fruit in natural juice (without added sugar). Meat and other protein foods Skinless chicken or [place]. Ground chicken or [place]. Pork with fat trimmed off. Fish and seafood. Egg whites. Dried beans, peas, or lentils. Unsalted nuts, nut butters, and seeds. Unsalted canned beans. Lean cuts of beef with fat trimmed off. Low-sodium, lean deli meat. Dairy Low-fat (1%) or fat-free (skim) milk. Fat-free, low-fat, or reduced-fat cheeses. Nonfat, low-sodium ricotta or cottage cheese. Low-fat or nonfat yogurt. Low-fat, low-sodium cheese. Fats and oils Soft margarine without trans fats. Vegetable oil. Low-fat, reduced-fat, or light mayonnaise and salad dressings (reduced-sodium). Canola, safflower, olive, soybean, and sunflower oils. Avocado. Seasoning and other foods Herbs. Spices. Seasoning mixes without salt. Unsalted popcorn and pretzels. Fat-free sweets. What foods are not recommended? The items listed may not be a complete list. Talk with your dietitian about what dietary choices are best for you. Grains Baked goods made with fat,  such as croissants, muffins, or some breads. Dry pasta or rice meal packs. Vegetables Creamed or fried vegetables. Vegetables in a cheese sauce. Regular canned vegetables (not low-sodium or reduced-sodium). Regular canned tomato sauce and paste (not low-sodium or reduced-sodium). Regular tomato and vegetable juice (not low-sodium or reduced-sodium). Rosita Fire. Olives. Fruits Canned fruit in a light or heavy syrup. Fried fruit. Fruit in cream or butter sauce. Meat and other protein foods Fatty cuts of meat. Ribs. Fried meat. Tomasa Blase. Sausage. Bologna and other processed lunch meats. Salami. Fatback. Hotdogs. Bratwurst. Salted nuts and seeds. Canned beans with added salt. Canned or smoked fish. Whole eggs or egg yolks. Chicken or [place] with skin. Dairy Whole or 2% milk, cream, and half-and-half. Whole or full-fat cream cheese. Whole-fat or sweetened yogurt. Full-fat cheese. Nondairy creamers. Whipped toppings. Processed cheese and cheese spreads. Fats and oils Butter. Stick margarine. Lard. Shortening. Ghee. Bacon fat. Tropical oils, such as coconut, palm kernel, or palm oil. Seasoning and other foods Salted popcorn and pretzels. Onion salt, garlic salt, seasoned salt, table salt, and sea salt. Worcestershire sauce. Tartar sauce. Barbecue sauce. Teriyaki sauce. Soy sauce, including reduced-sodium. Steak sauce. Canned and packaged gravies. Fish sauce. Oyster sauce. Cocktail sauce. Horseradish that you find on the shelf. Ketchup. Mustard.  Meat flavorings and tenderizers. Bouillon cubes. Hot sauce and Tabasco sauce. Premade or packaged marinades. Premade or packaged taco seasonings. Relishes. Regular salad dressings. Where to find more information:  National Heart, Lung, and Blood Institute: PopSteam.is  American Heart Association: www.heart.org Summary  The DASH eating plan is a healthy eating plan that has been shown to reduce high blood pressure (hypertension). It may also reduce your risk for  type 2 diabetes, heart disease, and stroke.  With the DASH eating plan, you should limit salt (sodium) intake to 2,300 mg a day. If you have hypertension, you may need to reduce your sodium intake to 1,500 mg a day.  When on the DASH eating plan, aim to eat more fresh fruits and vegetables, whole grains, lean proteins, low-fat dairy, and heart-healthy fats.  Work with your health care provider or diet and nutrition specialist (dietitian) to adjust your eating plan to your individual calorie needs. This information is not intended to replace advice given to you by your health care provider. Make sure you discuss any questions you have with your health care provider. Document Released: 06/02/2011 Document Revised: 06/06/2016 Document Reviewed: 06/06/2016 Elsevier Interactive Patient Education  2018 [employer].  Echocardiogram An echocardiogram, or echocardiography, uses sound waves (ultrasound) to produce an image of your heart. The echocardiogram is simple, painless, obtained within a short period of time, and offers valuable information to your health care provider. The images from an echocardiogram can provide information such as:  Evidence of coronary artery disease (CAD).  Heart size.  Heart muscle function.  Heart valve function.  Aneurysm detection.  Evidence of a past heart attack.  Fluid buildup around the heart.  Heart muscle thickening.  Assess heart valve function.  Tell a health care provider about:  Any allergies you have.  All medicines you are taking, including vitamins, herbs, eye drops, creams, and over-the-counter medicines.  Any problems you or family members have had with anesthetic medicines.  Any blood disorders you have.  Any surgeries you have had.  Any medical conditions you have.  Whether you are pregnant or may be pregnant. What happens before the procedure? No special preparation is needed. Eat and drink normally. What happens during the  procedure?  In order to produce an image of your heart, gel will be applied to your chest and a wand-like tool (transducer) will be moved over your chest. The gel will help transmit the sound waves from the transducer. The sound waves will harmlessly bounce off your heart to allow the heart images to be captured in real-time motion. These images will then be recorded.  You may need an IV to receive a medicine that improves the quality of the pictures. What happens after the procedure? You may return to your normal schedule including diet, activities, and medicines, unless your health care provider tells you otherwise. This information is not intended to replace advice given to you by your health care provider. Make sure you discuss any questions you have with your health care provider. Document Released: 06/10/2000 Document Revised: 01/30/2016 Document Reviewed: 2020/06/412 Elsevier Interactive Patient Education  2017 [employer].    If you need a refill on your cardiac medications before your next appointment, please call your pharmacy.

## 2017-12-12 ENCOUNTER — Other Ambulatory Visit: Payer: Self-pay

## 2017-12-12 ENCOUNTER — Encounter (INDEPENDENT_AMBULATORY_CARE_PROVIDER_SITE_OTHER): Payer: Self-pay

## 2017-12-12 ENCOUNTER — Ambulatory Visit (HOSPITAL_COMMUNITY): Payer: Medicare Other | Attending: Cardiovascular Disease

## 2017-12-12 DIAGNOSIS — Z951 Presence of aortocoronary bypass graft: Secondary | ICD-10-CM | POA: Diagnosis not present

## 2017-12-12 DIAGNOSIS — E785 Hyperlipidemia, unspecified: Secondary | ICD-10-CM | POA: Diagnosis not present

## 2017-12-12 DIAGNOSIS — Z8249 Family history of ischemic heart disease and other diseases of the circulatory system: Secondary | ICD-10-CM | POA: Insufficient documentation

## 2017-12-12 DIAGNOSIS — I5033 Acute on chronic diastolic (congestive) heart failure: Secondary | ICD-10-CM | POA: Diagnosis not present

## 2017-12-12 DIAGNOSIS — E669 Obesity, unspecified: Secondary | ICD-10-CM | POA: Diagnosis not present

## 2017-12-12 DIAGNOSIS — Z6831 Body mass index (BMI) 31.0-31.9, adult: Secondary | ICD-10-CM | POA: Insufficient documentation

## 2017-12-12 DIAGNOSIS — I110 Hypertensive heart disease with heart failure: Secondary | ICD-10-CM | POA: Insufficient documentation

## 2017-12-12 DIAGNOSIS — I340 Nonrheumatic mitral (valve) insufficiency: Secondary | ICD-10-CM | POA: Insufficient documentation

## 2017-12-12 DIAGNOSIS — I2510 Atherosclerotic heart disease of native coronary artery without angina pectoris: Secondary | ICD-10-CM | POA: Diagnosis not present

## 2017-12-19 ENCOUNTER — Ambulatory Visit: Payer: Medicare Other | Admitting: Physician Assistant

## 2017-12-19 ENCOUNTER — Encounter: Payer: Self-pay | Admitting: Physician Assistant

## 2017-12-19 VITALS — BP 104/68 | HR 88 | Ht 66.0 in | Wt 195.0 lb

## 2017-12-19 DIAGNOSIS — E7800 Pure hypercholesterolemia, unspecified: Secondary | ICD-10-CM

## 2017-12-19 DIAGNOSIS — I2510 Atherosclerotic heart disease of native coronary artery without angina pectoris: Secondary | ICD-10-CM

## 2017-12-19 DIAGNOSIS — I5043 Acute on chronic combined systolic (congestive) and diastolic (congestive) heart failure: Secondary | ICD-10-CM

## 2017-12-19 DIAGNOSIS — I10 Essential (primary) hypertension: Secondary | ICD-10-CM

## 2017-12-19 DIAGNOSIS — I739 Peripheral vascular disease, unspecified: Secondary | ICD-10-CM | POA: Diagnosis not present

## 2017-12-19 MED ORDER — FUROSEMIDE 40 MG PO TABS: 60.0000 mg | ORAL_TABLET | Freq: Two times a day (BID) | ORAL | 3 refills | Status: DC

## 2017-12-19 NOTE — Patient Instructions (Addendum)
Medication Instructions:  Your physician has recommended you make the following change in your medication:   1. INCREASE: lasix to 60 mg twice a day- Take 1 1/2 tablets by mouth twice a day  Labwork: TODAY: BMET, BNP  Testing/Procedures: None ordered  Follow-Up: Your physician recommends that you schedule a follow-up appointment at the end of July or beginning of August with Jacolyn ReedyMichele Lenze , PA  Keep appointment with Dr. Mayford Knifeurner on 03/05/18 at 8:20 AM    Any Other Special Instructions Will Be Listed Below (If Applicable).     If you need a refill on your cardiac medications before your next appointment, please call your pharmacy.
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
## 2017-12-20 LAB — BASIC METABOLIC PANEL
BUN / CREAT RATIO: 17 (ref 10–24)
BUN: 21 mg/dL (ref 8–27)
CO2: 27 mmol/L (ref 20–29)
Calcium: 11.3 mg/dL — ABNORMAL HIGH (ref 8.6–10.2)
Chloride: 103 mmol/L (ref 96–106)
Creatinine, Ser: 1.24 mg/dL (ref 0.76–1.27)
GFR, EST AFRICAN AMERICAN: 67 mL/min/{1.73_m2} (ref >59)
GFR, EST NON AFRICAN AMERICAN: 58 mL/min/{1.73_m2} — AB (ref >59)
Glucose: 91 mg/dL (ref 65–99)
POTASSIUM: 4 mmol/L (ref 3.5–5.2)
Sodium: 143 mmol/L (ref 134–144)

## 2017-12-20 LAB — PRO B NATRIURETIC PEPTIDE: NT-Pro BNP: 1961 pg/mL — ABNORMAL HIGH (ref 0–376)

## 2017-12-22 ENCOUNTER — Telehealth: Payer: Self-pay | Admitting: Physician Assistant

## 2017-12-22 NOTE — Telephone Encounter (Signed)
Notes recorded by Dyann KiefLenze, Michele M, PA-C on 12/20/2017 at 10:03 AM EDT Renal function normal, BNP even higher. Increase Lasix to 80 mg twice a day for 3 days then back down to 60 mg twice daily until I see him back. Reiterate importance of 2 g sodium diet.    Spoke with the pt and informed him that per Herma CarsonMichelle Lenze PA-C, based on his labs, his renal function is normal, BNP higher, and she recommends that he increase his lasix to 80 mg po bid x 3 days, then go back to 60 mg po bid, thereafter until his next OV with Marcelino DusterMichelle on 01/23/18.  Reflected temporary increase of lasix in the pts medications.  Pt states he has enough supply of lasix on hand.  Pt education provided on the importance of utilizing the 2 gram sodium diet.  Pt verbalized understanding and agrees with this plan.

## 2017-12-22 NOTE — Telephone Encounter (Signed)
Follow Up:     Returning Jennifer's call from 12-20-17,concerning his lab results.

## 2018-01-06 ENCOUNTER — Other Ambulatory Visit: Payer: Self-pay | Admitting: Physician Assistant

## 2018-01-19 ENCOUNTER — Other Ambulatory Visit: Payer: Self-pay

## 2018-01-19 ENCOUNTER — Telehealth: Payer: Self-pay

## 2018-01-19 ENCOUNTER — Inpatient Hospital Stay (HOSPITAL_COMMUNITY)
Admission: EM | Admit: 2018-01-19 | Discharge: 2018-01-23 | DRG: 292 | Disposition: A | Discharge status: Home / Self Care | Payer: Medicare Other | Attending: Cardiology | Admitting: Cardiology

## 2018-01-19 ENCOUNTER — Encounter (HOSPITAL_COMMUNITY): Payer: Self-pay | Admitting: Emergency Medicine

## 2018-01-19 ENCOUNTER — Telehealth: Payer: Self-pay | Admitting: Cardiology

## 2018-01-19 DIAGNOSIS — I959 Hypotension, unspecified: Secondary | ICD-10-CM | POA: Diagnosis present

## 2018-01-19 DIAGNOSIS — Z79899 Other long term (current) drug therapy: Secondary | ICD-10-CM | POA: Diagnosis not present

## 2018-01-19 DIAGNOSIS — I5043 Acute on chronic combined systolic (congestive) and diastolic (congestive) heart failure: Secondary | ICD-10-CM | POA: Diagnosis present

## 2018-01-19 DIAGNOSIS — I509 Heart failure, unspecified: Secondary | ICD-10-CM

## 2018-01-19 DIAGNOSIS — E785 Hyperlipidemia, unspecified: Secondary | ICD-10-CM | POA: Diagnosis present

## 2018-01-19 DIAGNOSIS — I110 Hypertensive heart disease with heart failure: Principal | ICD-10-CM | POA: Diagnosis present

## 2018-01-19 DIAGNOSIS — I248 Other forms of acute ischemic heart disease: Secondary | ICD-10-CM | POA: Diagnosis present

## 2018-01-19 DIAGNOSIS — Z888 Allergy status to other drugs, medicaments and biological substances status: Secondary | ICD-10-CM | POA: Diagnosis not present

## 2018-01-19 DIAGNOSIS — K760 Fatty (change of) liver, not elsewhere classified: Secondary | ICD-10-CM | POA: Diagnosis present

## 2018-01-19 DIAGNOSIS — Z951 Presence of aortocoronary bypass graft: Secondary | ICD-10-CM

## 2018-01-19 DIAGNOSIS — I2510 Atherosclerotic heart disease of native coronary artery without angina pectoris: Secondary | ICD-10-CM | POA: Diagnosis present

## 2018-01-19 DIAGNOSIS — R0602 Shortness of breath: Secondary | ICD-10-CM | POA: Diagnosis present

## 2018-01-19 DIAGNOSIS — I2583 Coronary atherosclerosis due to lipid rich plaque: Secondary | ICD-10-CM | POA: Diagnosis present

## 2018-01-19 DIAGNOSIS — R778 Other specified abnormalities of plasma proteins: Secondary | ICD-10-CM | POA: Diagnosis present

## 2018-01-19 DIAGNOSIS — Z8249 Family history of ischemic heart disease and other diseases of the circulatory system: Secondary | ICD-10-CM

## 2018-01-19 DIAGNOSIS — Z7982 Long term (current) use of aspirin: Secondary | ICD-10-CM | POA: Diagnosis not present

## 2018-01-19 DIAGNOSIS — H409 Unspecified glaucoma: Secondary | ICD-10-CM | POA: Diagnosis present

## 2018-01-19 DIAGNOSIS — R7989 Other specified abnormal findings of blood chemistry: Secondary | ICD-10-CM | POA: Diagnosis present

## 2018-01-19 DIAGNOSIS — M722 Plantar fascial fibromatosis: Secondary | ICD-10-CM | POA: Diagnosis present

## 2018-01-19 DIAGNOSIS — I10 Essential (primary) hypertension: Secondary | ICD-10-CM | POA: Diagnosis not present

## 2018-01-19 DIAGNOSIS — E1151 Type 2 diabetes mellitus with diabetic peripheral angiopathy without gangrene: Secondary | ICD-10-CM | POA: Diagnosis present

## 2018-01-19 DIAGNOSIS — E7800 Pure hypercholesterolemia, unspecified: Secondary | ICD-10-CM | POA: Diagnosis not present

## 2018-01-19 DIAGNOSIS — E213 Hyperparathyroidism, unspecified: Secondary | ICD-10-CM | POA: Diagnosis present

## 2018-01-19 DIAGNOSIS — I739 Peripheral vascular disease, unspecified: Secondary | ICD-10-CM | POA: Diagnosis present

## 2018-01-19 DIAGNOSIS — R600 Localized edema: Secondary | ICD-10-CM | POA: Diagnosis not present

## 2018-01-19 HISTORY — DX: Peripheral vascular disease, unspecified: I73.9

## 2018-01-19 HISTORY — DX: Prediabetes: R73.03

## 2018-01-19 LAB — BASIC METABOLIC PANEL WITH GFR
Anion gap: 9 (ref 5–15)
BUN: 17 mg/dL (ref 8–23)
CO2: 28 mmol/L (ref 22–32)
Calcium: 10.7 mg/dL — ABNORMAL HIGH (ref 8.9–10.3)
Chloride: 105 mmol/L (ref 98–111)
Creatinine, Ser: 1.31 mg/dL — ABNORMAL HIGH (ref 0.61–1.24)
GFR calc Af Amer: >60 mL/min
GFR calc non Af Amer: 53 mL/min — ABNORMAL LOW
Glucose, Bld: 114 mg/dL — ABNORMAL HIGH (ref 70–99)
Potassium: 3.4 mmol/L — ABNORMAL LOW (ref 3.5–5.1)
Sodium: 142 mmol/L (ref 135–145)

## 2018-01-19 LAB — I-STAT TROPONIN, ED: Troponin i, poc: 0.2 ng/mL (ref 0.00–0.08)

## 2018-01-19 LAB — CBC
HCT: 49.9 % (ref 39.0–52.0)
Hemoglobin: 16.6 g/dL (ref 13.0–17.0)
MCH: 33 pg (ref 26.0–34.0)
MCHC: 33.3 g/dL (ref 30.0–36.0)
MCV: 99.2 fL (ref 78.0–100.0)
Platelets: 167 10*3/uL (ref 150–400)
RBC: 5.03 MIL/uL (ref 4.22–5.81)
RDW: 13.2 % (ref 11.5–15.5)
WBC: 3.9 10*3/uL — ABNORMAL LOW (ref 4.0–10.5)

## 2018-01-19 LAB — BRAIN NATRIURETIC PEPTIDE: B Natriuretic Peptide: 635.8 pg/mL — ABNORMAL HIGH (ref 0.0–100.0)

## 2018-01-19 LAB — TROPONIN I: Troponin I: 0.14 ng/mL

## 2018-01-19 MED ORDER — NITROGLYCERIN 0.4 MG SL SUBL: 0.4000 mg | SUBLINGUAL_TABLET | SUBLINGUAL | Status: DC | PRN

## 2018-01-19 MED ORDER — POTASSIUM CHLORIDE CRYS ER 20 MEQ PO TBCR
40.0000 meq | EXTENDED_RELEASE_TABLET | Freq: Two times a day (BID) | ORAL | Status: DC
  Administered 2018-01-19 – 2018-01-23 (×8): 40 meq via ORAL
  Filled 2018-01-19 (×8): qty 2

## 2018-01-19 MED ORDER — SODIUM CHLORIDE 0.9 % IV SOLN: 250.0000 mL | INTRAVENOUS | Status: DC | PRN

## 2018-01-19 MED ORDER — FLUTICASONE PROPIONATE 50 MCG/ACT NA SUSP: 1.0000 | Freq: Every day | NASAL | Status: DC | PRN

## 2018-01-19 MED ORDER — LATANOPROST 0.005 % OP SOLN
1.0000 [drp] | Freq: Every day | OPHTHALMIC | Status: DC
  Administered 2018-01-19 – 2018-01-22 (×4): 1 [drp] via OPHTHALMIC
  Filled 2018-01-19: qty 2.5

## 2018-01-19 MED ORDER — NITROGLYCERIN 0.4 MG/SPRAY TL SOLN: 1.0000 | Status: DC | PRN

## 2018-01-19 MED ORDER — PANTOPRAZOLE SODIUM 40 MG PO TBEC
40.0000 mg | DELAYED_RELEASE_TABLET | Freq: Every day | ORAL | Status: DC
  Administered 2018-01-20 – 2018-01-23 (×4): 40 mg via ORAL
  Filled 2018-01-19 (×4): qty 1

## 2018-01-19 MED ORDER — SODIUM CHLORIDE 0.9% FLUSH
3.0000 mL | Freq: Two times a day (BID) | INTRAVENOUS | Status: DC
  Administered 2018-01-19 – 2018-01-23 (×7): 3 mL via INTRAVENOUS

## 2018-01-19 MED ORDER — HEPARIN SODIUM (PORCINE) 5000 UNIT/ML IJ SOLN
5000.0000 [IU] | Freq: Three times a day (TID) | INTRAMUSCULAR | Status: DC
Start: 2018-01-19 — End: 2018-01-23
  Administered 2018-01-19 – 2018-01-22 (×10): 5000 [IU] via SUBCUTANEOUS
  Filled 2018-01-19 (×11): qty 1

## 2018-01-19 MED ORDER — ADULT MULTIVITAMIN W/MINERALS CH
1.0000 | ORAL_TABLET | Freq: Every day | ORAL | Status: DC
  Administered 2018-01-19 – 2018-01-22 (×4): 1 via ORAL
  Filled 2018-01-19 (×4): qty 1

## 2018-01-19 MED ORDER — FUROSEMIDE 10 MG/ML IJ SOLN
40.0000 mg | Freq: Once | INTRAMUSCULAR | Status: AC
  Administered 2018-01-19: 40 mg via INTRAVENOUS
  Filled 2018-01-19: qty 4

## 2018-01-19 MED ORDER — SODIUM CHLORIDE 0.9% FLUSH: 3.0000 mL | INTRAVENOUS | Status: DC | PRN

## 2018-01-19 MED ORDER — FUROSEMIDE 10 MG/ML IJ SOLN
60.0000 mg | Freq: Once | INTRAMUSCULAR | Status: AC
  Administered 2018-01-19: 60 mg via INTRAVENOUS
  Filled 2018-01-19: qty 6

## 2018-01-19 MED ORDER — ONDANSETRON HCL 4 MG/2ML IJ SOLN: 4.0000 mg | Freq: Four times a day (QID) | INTRAMUSCULAR | Status: DC | PRN

## 2018-01-19 MED ORDER — ASPIRIN EC 81 MG PO TBEC
81.0000 mg | DELAYED_RELEASE_TABLET | Freq: Every day | ORAL | Status: DC
  Administered 2018-01-19 – 2018-01-23 (×5): 81 mg via ORAL
  Filled 2018-01-19 (×5): qty 1

## 2018-01-19 MED ORDER — ACETAMINOPHEN 325 MG PO TABS: 650.0000 mg | ORAL_TABLET | ORAL | Status: DC | PRN
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
## 2018-01-19 NOTE — Telephone Encounter (Signed)
New message    Patient walked in , says he is having sob: nurse came out to speak with him Pt c/o Shortness Of Breath: STAT if SOB developed within the last 24 hours or pt is noticeably SOB on the phone  1. Are you currently SOB (can you hear that pt is SOB on the phone)? no  2. How long have you been experiencing SOB?  Since saturday  3. Are you SOB when sitting or when up moving around? both  4. Are you currently experiencing any other symptoms? Patient states he is having swelling, and when he lays down he is having pressure in chest

## 2018-01-19 NOTE — ED Triage Notes (Signed)
[facility]. Per paperwork from PCP, "Patient was supposed to be taking 80mg  of lasix daily but was only taking 40mg  because increased doses made him pee too frequently." Patient denies pain, states somewhat increased shortness of breath from his baseline. States he has difficulty sleeping due to being unable to lay flat at night. Patient alert, oriented, and in no apparent distress at this time.

## 2018-01-19 NOTE — ED Provider Notes (Signed)
Patient placed in Quick Look pathway, seen and evaluated   Chief Complaint: SOB  HPI: Patient with his history of congestive heart failure on Lasix, presents with worsening shortness of breath over the past 1 week.  Patient was seen by his primary care doctor today and referred to the emergency department for possible congestive heart failure exacerbation.  He denies fever.  He has occasional cough and shortness of breath with exertion.  He has been taking a lower dose of his Lasix and prescribed due to frequent urination.  No fevers.  ROS:  Positive ROS: (+) Shortness of breath, cough Negative ROS: (-) Fever  Physical Exam:   Gen: No distress  Neuro: Awake and Alert  Skin: Warm    Focused Exam: Heart RRR, nml S1,S2, no m/r/g; Lungs decreased breath sounds; Abd soft, NT, no rebound or guarding; Ext 2+ pedal pulses bilaterally.  BP 116/82 (BP Location: Left Arm)   Pulse 88   Temp (!) 96.8 F (36 C) (Oral)   Resp 16   SpO2 96%   Plan: CHF work-up, chest x-ray, patient in no distress, normal blood pressure  Initiation of care has begun. The patient has been counseled on the process, plan, and necessity for staying for the completion/evaluation, and the remainder of the medical screening examination    Renne CriglerGeiple, Gerilyn Stargell, Cordelia Poche-C 01/19/18 1400    Eber HongMiller, Brian, MD 01/20/18 2134
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
## 2018-01-19 NOTE — Telephone Encounter (Signed)
Received call from the front desk that [name] walked in the office (without appointment) wanting to be seen today.  Went and spoke with the patient. He complains of SOB on exertion, swelling, and fatigue.  After much discussion, he also complains of "tasting sweet" all the time, feeling cold, and getting tingly feelings in his feet.  The patient does not appear acutely ill now. He does get noticeably SOB on exertion and his ankles do appear swollen. Confirmed with patient he is taking Lasix 60 mg BID.  After last visit, Lasix was increased to 80 mg BID. Instructed him to increase Lasix to 80 mg BID again until seen by Leda GauzeM. Lenze on Tuesday (he has an already scheduled appointment on 7/30). Reiterated to him to strictly limit his salt and to elevate legs while sitting.  Also instructed him to go to PCP today for evaluation of other symptoms. 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   He understands to proceed to ER over the weekend if symptoms worsen.  He understands he will be called if Dr. Mayford Knifeurner has further recommendations prior to visit next week.

## 2018-01-19 NOTE — ED Notes (Signed)
Attempted to collect labs from IV, IV will not pull

## 2018-01-20 DIAGNOSIS — E7800 Pure hypercholesterolemia, unspecified: Secondary | ICD-10-CM

## 2018-01-20 DIAGNOSIS — I10 Essential (primary) hypertension: Secondary | ICD-10-CM

## 2018-01-20 DIAGNOSIS — I2583 Coronary atherosclerosis due to lipid rich plaque: Secondary | ICD-10-CM

## 2018-01-20 DIAGNOSIS — R600 Localized edema: Secondary | ICD-10-CM

## 2018-01-20 LAB — BASIC METABOLIC PANEL
Anion gap: 11 (ref 5–15)
BUN: 18 mg/dL (ref 8–23)
CHLORIDE: 103 mmol/L (ref 98–111)
CO2: 27 mmol/L (ref 22–32)
Calcium: 10.8 mg/dL — ABNORMAL HIGH (ref 8.9–10.3)
Creatinine, Ser: 1.31 mg/dL — ABNORMAL HIGH (ref 0.61–1.24)
GFR calc Af Amer: >60 mL/min (ref >60)
GFR calc non Af Amer: 53 mL/min — ABNORMAL LOW (ref >60)
GLUCOSE: 132 mg/dL — AB (ref 70–99)
POTASSIUM: 3.9 mmol/L (ref 3.5–5.1)
Sodium: 141 mmol/L (ref 135–145)

## 2018-01-20 LAB — TROPONIN I
TROPONIN I: 0.14 ng/mL — AB (ref <0.03)
Troponin I: 0.14 ng/mL (ref <0.03)

## 2018-01-20 LAB — HEPATIC FUNCTION PANEL
ALK PHOS: 118 U/L (ref 38–126)
ALT: 41 U/L (ref 0–44)
AST: 64 U/L — ABNORMAL HIGH (ref 15–41)
Albumin: 3.4 g/dL — ABNORMAL LOW (ref 3.5–5.0)
BILIRUBIN DIRECT: 1.1 mg/dL — AB (ref 0.0–0.2)
BILIRUBIN INDIRECT: 3.6 mg/dL — AB (ref 0.3–0.9)
BILIRUBIN TOTAL: 4.7 mg/dL — AB (ref 0.3–1.2)
Total Protein: 6.8 g/dL (ref 6.5–8.1)

## 2018-01-20 LAB — CBC
HCT: 45.5 % (ref 39.0–52.0)
HEMOGLOBIN: 15.2 g/dL (ref 13.0–17.0)
MCH: 32.8 pg (ref 26.0–34.0)
MCHC: 33.4 g/dL (ref 30.0–36.0)
MCV: 98.1 fL (ref 78.0–100.0)
Platelets: 149 10*3/uL — ABNORMAL LOW (ref 150–400)
RBC: 4.64 MIL/uL (ref 4.22–5.81)
RDW: 13.3 % (ref 11.5–15.5)
WBC: 3.3 10*3/uL — ABNORMAL LOW (ref 4.0–10.5)

## 2018-01-20 LAB — TSH: TSH: 0.968 u[IU]/mL (ref 0.350–4.500)

## 2018-01-20 LAB — MAGNESIUM: Magnesium: 1.6 mg/dL — ABNORMAL LOW (ref 1.7–2.4)

## 2018-01-20 MED ORDER — FUROSEMIDE 10 MG/ML IJ SOLN
80.0000 mg | Freq: Two times a day (BID) | INTRAMUSCULAR | Status: DC
  Administered 2018-01-20 – 2018-01-21 (×4): 80 mg via INTRAVENOUS
  Filled 2018-01-20 (×5): qty 8

## 2018-01-20 NOTE — Progress Notes (Signed)
Progress Note  Patient Name: Stanley AzureRonald J [name] Date of Encounter: 01/20/2018  Primary Cardiologist: Armanda Magicraci Mayur Duman, MD   Subjective   Feels much better today.  He weighed himself yesterday morning and was 200 pounds and is now down to 194 pounds.  Shortness of breath is not completely resolved  Inpatient Medications    Scheduled Meds: . aspirin EC  81 mg Oral Daily  . furosemide  80 mg Intravenous BID  . heparin  5,000 Units Subcutaneous Q8H  . latanoprost  1 drop Both Eyes QHS  . multivitamin with minerals  1 tablet Oral QHS  . pantoprazole  40 mg Oral Daily  . potassium chloride  40 mEq Oral BID  . sodium chloride flush  3 mL Intravenous Q12H   Continuous Infusions: . sodium chloride     PRN Meds: sodium chloride, acetaminophen, fluticasone, nitroGLYCERIN, ondansetron (ZOFRAN) IV, sodium chloride flush   Vital Signs    Vitals:   01/19/18 1930 01/19/18 2052 01/20/18 0451 01/20/18 0500  BP: 120/80 113/85  109/84  Pulse: 87 78  86  Resp:  18  16  Temp:  (!) 97.5 F (36.4 C)  97.6 F (36.4 C)  TempSrc:  Oral  Oral  SpO2: 97% 98%  98%  Weight:   194 lb 6.4 oz (88.2 kg)   Height:   5' 6.5" (1.689 m)     Intake/Output Summary (Last 24 hours) at 01/20/2018 1154 Last data filed at 01/20/2018 1008 Gross per 24 hour  Intake 366 ml  Output 500 ml  Net -134 ml   Filed Weights   01/20/18 0451  Weight: 194 lb 6.4 oz (88.2 kg)    Telemetry    Sinus rhythm- Personally Reviewed  ECG    No new EKG to review- Personally Reviewed  Physical Exam   GEN: No acute distress.   Neck: No JVD Cardiac: RRR, no murmurs, rubs, or gallops.  Respiratory: Clear to auscultation bilaterally. GI: Soft, nontender, non-distended  MS:  2+ lower extremity edema; No deformity. Neuro:  Nonfocal  Psych: Normal affect   Labs    Chemistry Recent Labs  Lab 01/19/18 1410 01/20/18 0242 01/20/18 0902  NA 142  --  141  K 3.4*  --  3.9  CL 105  --  103  CO2 28  --  27  GLUCOSE 114*   --  132*  BUN 17  --  18  CREATININE 1.31*  --  1.31*  CALCIUM 10.7*  --  10.8*  PROT  --  6.8  --   ALBUMIN  --  3.4*  --   AST  --  64*  --   ALT  --  41  --   ALKPHOS  --  118  --   BILITOT  --  4.7*  --   GFRNONAA 53*  --  53*  GFRAA >60  --  >60  ANIONGAP 9  --  11     Hematology Recent Labs  Lab 01/19/18 1410 01/20/18 0902  WBC 3.9* 3.3*  RBC 5.03 4.64  HGB 16.6 15.2  HCT 49.9 45.5  MCV 99.2 98.1  MCH 33.0 32.8  MCHC 33.3 33.4  RDW 13.2 13.3  PLT 167 149*    Cardiac Enzymes Recent Labs  Lab 01/19/18 2042 01/20/18 0242 01/20/18 0902  TROPONINI 0.14* 0.14* 0.14*    Recent Labs  Lab 01/19/18 1417  TROPIPOC 0.20*     BNP Recent Labs  Lab 01/19/18 1410  BNP 635.8*  DDimer No results for input(s): DDIMER in the last 168 hours.   Radiology    No results found.  Cardiac Studies   2d echo 12/12/2017 Study Conclusions  - Left ventricle: The cavity size was normal. Wall thickness was   increased in a pattern of severe LVH. Systolic function was   mildly reduced. The estimated ejection fraction was in the range   of 45% to 50%. Features are consistent with a pseudonormal left   ventricular filling pattern, with concomitant abnormal relaxation   and increased filling pressure (grade 2 diastolic dysfunction). - Mitral valve: There was moderate regurgitation. - Left atrium: The atrium was severely dilated. - Right ventricle: The cavity size was mildly dilated. Systolic   function was mildly to moderately reduced. - Right atrium: The atrium was moderately dilated. - Pulmonary arteries: Systolic pressure was mildly increased. PA   peak pressure: 34 mm Hg (S).  Patient Profile     72 y.o. male with a history of systolic heart failure, coronary artery disease here with shortness of breath.  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 -He put out 500 cc yesterday and is net -134 cc -Weight is down 6 pounds -Continue Lasix IV 80 mg twice daily -Question whether he is adequately absorbing p.o. Lasix therefore will plan on changing to Demadex 40 mg twice daily on discharge -K stable at 3.9 and creatinine stable at 1.31 -EF 45 to 50% by echo last month with grade 2 diastolic dysfunction  Fatigue/dyspnea -May be related to acute heart failure. -Feels better today -Hemoglobin stable at 15.2 -TSH normal at 0.968  Elevated troponin -his troponin was also mildly elevated at 0.09.   -Mildly elevated today with flat trend at 0.143  -this is likely related to his acute heart failure exacerbation, demand ischemia.   -Denies any anginal chest pain  Coronary artery disease -RCA stent 2002, continue aspirin. -Stress Myoview in 2017 with no ischemia -Not had any anginal chest pain -Last echo showed slight worsening of LV function compared to 2 years ago (60-65%>>45-50%) -Consider repeat Lexiscan Myoview as an outpatient assess for ischemia  Hypertension -Blood pressure is well controlled on exam today -He is intolerant to statins and Zetia  Hyperlipidemia -LDL goal less than 70 -Check FLP in a.m. -Repatha was cost prohibitive -Has not been approved for PCSK9 inhibitor because of lack of paperwork completion.  Chronic lower extremity edema - Hopefully some of the Lasix will help with this chronic condition.  -He says his skin on his legs is too thin for compression hose and is uncomfortable  Peripheral arterial disease - Dr. [name] 10/26/2017, Doppler showed occluded right anterior tibial and high-grade right posterior tibial  stenosis and right ABI of 0.83.  No indication for intervention.  Continue with exercise.   For questions or updates, please contact [facility] Please consult [email or web address] for contact info under Cardiology/STEMI.      Signed, [name], MD  01/20/2018, 11:54 AM

## 2018-01-21 ENCOUNTER — Other Ambulatory Visit: Payer: Self-pay

## 2018-01-21 DIAGNOSIS — I10 Essential (primary) hypertension: Secondary | ICD-10-CM

## 2018-01-21 DIAGNOSIS — I2510 Atherosclerotic heart disease of native coronary artery without angina pectoris: Secondary | ICD-10-CM

## 2018-01-21 DIAGNOSIS — I2583 Coronary atherosclerosis due to lipid rich plaque: Secondary | ICD-10-CM

## 2018-01-21 LAB — BASIC METABOLIC PANEL
Anion gap: 10 (ref 5–15)
BUN: 23 mg/dL (ref 8–23)
CO2: 28 mmol/L (ref 22–32)
CREATININE: 1.49 mg/dL — AB (ref 0.61–1.24)
Calcium: 11 mg/dL — ABNORMAL HIGH (ref 8.9–10.3)
Chloride: 104 mmol/L (ref 98–111)
GFR calc Af Amer: 52 mL/min — ABNORMAL LOW (ref >60)
GFR, EST NON AFRICAN AMERICAN: 45 mL/min — AB (ref >60)
GLUCOSE: 102 mg/dL — AB (ref 70–99)
POTASSIUM: 4 mmol/L (ref 3.5–5.1)
SODIUM: 142 mmol/L (ref 135–145)

## 2018-01-21 NOTE — Progress Notes (Addendum)
Progress Note  Patient Name: Stanley [name] Date of Encounter: 01/21/2018  Primary Cardiologist: [name], MD   Subjective   SOB much better.  No CP.  Still with some LE edema   Inpatient Medications    Scheduled Meds: . aspirin EC  81 mg Oral Daily  . furosemide  80 mg Intravenous BID  . heparin  5,000 Units Subcutaneous Q8H  . latanoprost  1 drop Both Eyes QHS  . multivitamin with minerals  1 tablet Oral QHS  . pantoprazole  40 mg Oral Daily  . potassium chloride  40 mEq Oral BID  . sodium chloride flush  3 mL Intravenous Q12H   Continuous Infusions: . sodium chloride     PRN Meds: sodium chloride, acetaminophen, fluticasone, nitroGLYCERIN, ondansetron (ZOFRAN) IV, sodium chloride flush   Vital Signs    Vitals:   01/20/18 1204 01/20/18 1920 01/21/18 0542 01/21/18 0840  BP: 110/82 111/71 111/78 119/86  Pulse: 85 71 87 84  Resp: 20 18 18    Temp: (!) 97.4 F (36.3 C) (!) 97.5 F (36.4 C) 98.7 F (37.1 C)   TempSrc: Oral Oral Oral   SpO2: 97% 96% 99%   Weight:   192 lb 11.2 oz (87.4 kg)   Height:        Intake/Output Summary (Last 24 hours) at 01/21/2018 1012 Last data filed at 01/21/2018 0920 Gross per 24 hour  Intake 840 ml  Output 1800 ml  Net -960 ml   Filed Weights   01/20/18 0451 01/21/18 0542  Weight: 194 lb 6.4 oz (88.2 kg) 192 lb 11.2 oz (87.4 kg)    Telemetry    NSR  With short burst of WCT- Personally Reviewed  ECG    No new EKG to review - Personally Reviewed  Physical Exam   GEN: No acute distress.   Neck: No JVD Cardiac: RRR, no murmurs, rubs, or gallops.  Respiratory: Clear to auscultation bilaterally. GI: Soft, nontender, non-distended  MS: No edema; No deformity. Neuro:  Nonfocal  Psych: Normal affect   Labs    Chemistry Recent Labs  Lab 01/19/18 1410 01/20/18 0242 01/20/18 0902  NA 142  --  141  K 3.4*  --  3.9  CL 105  --  103  CO2 28  --  27  GLUCOSE 114*  --  132*  BUN 17  --  18  CREATININE 1.31*  --   1.31*  CALCIUM 10.7*  --  10.8*  PROT  --  6.8  --   ALBUMIN  --  3.4*  --   AST  --  64*  --   ALT  --  41  --   ALKPHOS  --  118  --   BILITOT  --  4.7*  --   GFRNONAA 53*  --  53*  GFRAA >60  --  >60  ANIONGAP 9  --  11     Hematology Recent Labs  Lab 01/19/18 1410 01/20/18 0902  WBC 3.9* 3.3*  RBC 5.03 4.64  HGB 16.6 15.2  HCT 49.9 45.5  MCV 99.2 98.1  MCH 33.0 32.8  MCHC 33.3 33.4  RDW 13.2 13.3  PLT 167 149*    Cardiac Enzymes Recent Labs  Lab 01/19/18 2042 01/20/18 0242 01/20/18 0902  TROPONINI 0.14* 0.14* 0.14*    Recent Labs  Lab 01/19/18 1417  TROPIPOC 0.20*     BNP Recent Labs  Lab 01/19/18 1410  BNP 635.8*     DDimer  No results for input(s): DDIMER in the last 168 hours.   Radiology    No results found.  Cardiac Studies   2d echo 12/12/2017 Study Conclusions  - Left ventricle: The cavity size was normal. Wall thickness was increased in a pattern of severe LVH. Systolic function was mildly reduced. The estimated ejection fraction was in the range of 45% to 50%. Features are consistent with a pseudonormal left ventricular filling pattern, with concomitant abnormal relaxation and increased filling pressure (grade 2 diastolic dysfunction). - Mitral valve: There was moderate regurgitation. - Left atrium: The atrium was severely dilated. - Right ventricle: The cavity size was mildly dilated. Systolic function was mildly to moderately reduced. - Right atrium: The atrium was moderately dilated. - Pulmonary arteries: Systolic pressure was mildly increased. PA peak pressure: 34 mm Hg (S).    Patient Profile     72 y.o. male with a history of systolic heart failure, coronary artery disease here with shortness of breath.  Assessment & Plan    Acute on chronic systolic and diastolic heart failure exacerbation -He weighs himself daily and was 200 pounds a day of admission at home -Admitted with increasing shortness of  breath, PND and orthopnea despite increasing Lasix to 120 mg a day -He says he follows a strict low-sodium diet -Shortness of breath significantly improved but continues to have LE edema -He put out  1.8L yesterday and is net 1L -Weight is down 6 pounds from weight the morning prior to admit and down 2 lbs from yesterday -still has LE edema on exam -Continue Lasix IV 80 mg twice daily -? whether he is adequately absorbing p.o. Lasix therefore will plan on changing to Demadex 40 mg twice daily on discharge -BMET pending this am -EF 45 to 50% by echo last month with grade 2 diastolic dysfunction  Fatigue/dyspnea -May be related to acute heart failure. -Feels better today -Hemoglobin stable at 15.2 -TSH normal at 0.968  Elevated troponin -his troponin was also mildly elevated at 0.09.  -Mildly elevated today with flat trend at 0.143  -this is likely related to his acute heart failure exacerbation, demand ischemia.  -Denies any anginal chest pain but had a run of WCT on tele today -his LVF has declined from prior echo 2017 so will make NPO after MN for Lexiscan myoview in am  Coronary artery disease -RCA stent 2002, continue aspirin. -Stress Myoview in 2017 with no ischemia -Not had any anginal chest pain -Last echo showed slight worsening of LV function compared to 2 years ago (60-65%>>45-50%) -given WCT on tele and worsening LVF, will make NPO after MN for Lexiscan myoview in am  Hypertension -Blood pressure is well controlled on exam today -he is not on antihypertensive meds  Hyperlipidemia -LDL goal less than 70 -Check FLP  -he is intolerant to statins -Repatha was cost prohibitive -Has not been approved for PCSK9 inhibitor because of lack of paperwork completion.  Chronic lower extremity edema -hopefully Lasix will help some with this chronic condition.  -stressed importance of low sodium diet -He says his skin on his legs is too thin for compression hose and is  uncomfortable  Peripheral arterial disease - Dr. Allyson SabalBerry 10/26/2017, Doppler showed occluded right anterior tibial and high-grade right posterior tibial stenosis and right ABI of 0.83. No indication for intervention. Continue with exercise.   For questions or updates, please contact [facility] Please consult [email or web address] for contact info under Cardiology/STEMI.      Signed, Armanda Magicraci Turner, MD  01/21/2018, 10:12 AM

## 2018-01-22 ENCOUNTER — Inpatient Hospital Stay (HOSPITAL_COMMUNITY): Payer: Medicare Other

## 2018-01-22 LAB — LIPID PANEL
CHOL/HDL RATIO: 6 ratio
CHOLESTEROL: 192 mg/dL (ref 0–200)
HDL: 32 mg/dL — AB (ref >40)
LDL Cholesterol: 148 mg/dL — ABNORMAL HIGH (ref 0–99)
Triglycerides: 62 mg/dL (ref <150)
VLDL: 12 mg/dL (ref 0–40)

## 2018-01-22 LAB — NM MYOCAR MULTI W/SPECT W/WALL MOTION / EF
CSEPEDS: 39 s
Estimated workload: 1 METS
Exercise duration (min): 5 min
Peak HR: 84 {beats}/min
Rest HR: 74 {beats}/min

## 2018-01-22 LAB — MAGNESIUM: Magnesium: 1.8 mg/dL (ref 1.7–2.4)

## 2018-01-22 MED ORDER — REGADENOSON 0.4 MG/5ML IV SOLN
INTRAVENOUS | Status: AC
  Filled 2018-01-22: qty 5

## 2018-01-22 MED ORDER — MAGNESIUM SULFATE 2 GM/50ML IV SOLN
2.0000 g | Freq: Once | INTRAVENOUS | Status: AC
  Administered 2018-01-22: 2 g via INTRAVENOUS
  Filled 2018-01-22: qty 50

## 2018-01-22 MED ORDER — REGADENOSON 0.4 MG/5ML IV SOLN
0.4000 mg | Freq: Once | INTRAVENOUS | Status: AC
  Administered 2018-01-22: 0.4 mg via INTRAVENOUS
  Filled 2018-01-22: qty 5

## 2018-01-22 MED ORDER — TECHNETIUM TC 99M TETROFOSMIN IV KIT
10.0000 | PACK | Freq: Once | INTRAVENOUS | Status: AC | PRN
  Administered 2018-01-22: 10 via INTRAVENOUS

## 2018-01-22 MED ORDER — TECHNETIUM TC 99M TETROFOSMIN IV KIT
30.0000 | PACK | Freq: Once | INTRAVENOUS | Status: AC | PRN
  Administered 2018-01-22: 30 via INTRAVENOUS

## 2018-01-22 MED ORDER — TORSEMIDE 20 MG PO TABS
40.0000 mg | ORAL_TABLET | Freq: Two times a day (BID) | ORAL | Status: DC
  Administered 2018-01-22 – 2018-01-23 (×2): 40 mg via ORAL
  Filled 2018-01-22 (×2): qty 2

## 2018-01-22 NOTE — Progress Notes (Signed)
Progress Note  Patient Name: Stanley [name] Date of Encounter: 01/22/2018  Primary Cardiologist: [name], MD   Subjective   Seen in nuc med. No chest pain.  Breathing and edema improving.   Inpatient Medications    Scheduled Meds: . aspirin EC  81 mg Oral Daily  . heparin  5,000 Units Subcutaneous Q8H  . latanoprost  1 drop Both Eyes QHS  . multivitamin with minerals  1 tablet Oral QHS  . pantoprazole  40 mg Oral Daily  . potassium chloride  40 mEq Oral BID  . regadenoson      . sodium chloride flush  3 mL Intravenous Q12H   Continuous Infusions: . sodium chloride     PRN Meds: sodium chloride, acetaminophen, fluticasone, nitroGLYCERIN, ondansetron (ZOFRAN) IV, sodium chloride flush   Vital Signs    Vitals:   01/22/18 0443 01/22/18 0937 01/22/18 1008 01/22/18 1011  BP: 106/78 (!) 88/72 101/78 (!) 89/63  Pulse: 83     Resp: 16     Temp: (!) 97.5 F (36.4 C)     TempSrc: Oral     SpO2: 98%     Weight: 191 lb 8 oz (86.9 kg)     Height:        Intake/Output Summary (Last 24 hours) at 01/22/2018 1033 Last data filed at 01/22/2018 0449 Gross per 24 hour  Intake 465 ml  Output 1925 ml  Net -1460 ml   Filed Weights   01/20/18 0451 01/21/18 0542 01/22/18 0443  Weight: 194 lb 6.4 oz (88.2 kg) 192 lb 11.2 oz (87.4 kg) 191 lb 8 oz (86.9 kg)    Telemetry    Unable to review as patient seen in nuc med  ECG    N/A  Physical Exam   GEN: No acute distress.   Neck: No JVD Cardiac: RRR, no murmurs, rubs, or gallops.  Respiratory: Clear to auscultation bilaterally. GI: Soft, nontender, non-distended  MS: No edema; No deformity. Neuro:  Nonfocal  Psych: Normal affect   Labs    Chemistry Recent Labs  Lab 01/19/18 1410 01/20/18 0242 01/20/18 0902 01/21/18 1112  NA 142  --  141 142  K 3.4*  --  3.9 4.0  CL 105  --  103 104  CO2 28  --  27 28  GLUCOSE 114*  --  132* 102*  BUN 17  --  18 23  CREATININE 1.31*  --  1.31* 1.49*  CALCIUM 10.7*  --   10.8* 11.0*  PROT  --  6.8  --   --   ALBUMIN  --  3.4*  --   --   AST  --  64*  --   --   ALT  --  41  --   --   ALKPHOS  --  118  --   --   BILITOT  --  4.7*  --   --   GFRNONAA 53*  --  53* 45*  GFRAA >60  --  >60 52*  ANIONGAP 9  --  11 10     Hematology Recent Labs  Lab 01/19/18 1410 01/20/18 0902  WBC 3.9* 3.3*  RBC 5.03 4.64  HGB 16.6 15.2  HCT 49.9 45.5  MCV 99.2 98.1  MCH 33.0 32.8  MCHC 33.3 33.4  RDW 13.2 13.3  PLT 167 149*    Cardiac Enzymes Recent Labs  Lab 01/19/18 2042 01/20/18 0242 01/20/18 0902  TROPONINI 0.14* 0.14* 0.14*    Recent Labs  Lab 01/19/18 1417  TROPIPOC 0.20*     BNP Recent Labs  Lab 01/19/18 1410  BNP 635.8*     Radiology    No results found.  Cardiac Studies   2decho 12/12/2017 Study Conclusions  - Left ventricle: The cavity size was normal. Wall thickness was increased in a pattern of severe LVH. Systolic function was mildly reduced. The estimated ejection fraction was in the range of 45% to 50%. Features are consistent with a pseudonormal left ventricular filling pattern, with concomitant abnormal relaxation and increased filling pressure (grade 2 diastolic dysfunction). - Mitral valve: There was moderate regurgitation. - Left atrium: The atrium was severely dilated. - Right ventricle: The cavity size was mildly dilated. Systolic function was mildly to moderately reduced. - Right atrium: The atrium was moderately dilated. - Pulmonary arteries: Systolic pressure was mildly increased. PA peak pressure: 34 mm Hg (S).  Patient Profile     72 y.o. male with a history of systolic heart failure and coronary artery disease here with shortness of breath.  Assessment & Plan    1. Acute on chronic combined CHF - Admitted with increasing shortness of breath, PND and orthopnea despite increasing Lasix to 120 mg a day. Compliant with low sodium diet. BNP 635.8 on admit. EF 45 to 50% by echo last month  with grade 2 diastolic dysfunction. - Diuresed 2.5L so far. Weight down 3 lb (194-->191lb). Breathing and edema improving.  - Not on ACE/ARB or BB due to soft BP. Continue IV diuresis  2. Elevated troponin with hx of CAD s/p RCA stent in 2002 - Last echo 11/2016 showed slight worsening of LV function compared to 2 years ago(60-65%>>45-50%). - Troponin flat trend. - Pending final result of stress test  3. Hyperlipidemia -LDL goal less than 70. 01/22/2018: Cholesterol 192; HDL 32; LDL Cholesterol 148; Triglycerides 62; VLDL 12 -he is intolerant to statins -Repatha was cost prohibitive -Has not been approved for PCSK9 inhibitor because of lack of paperwork completion.    For questions or updates, please contact [facility] Please consult [email or web address] for contact info under Cardiology/STEMI.      Lorelei PontSigned, Symon Norwood, PA  01/22/2018, 10:33 AM
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
## 2018-01-23 ENCOUNTER — Encounter (HOSPITAL_COMMUNITY): Payer: Self-pay | Admitting: General Practice

## 2018-01-23 ENCOUNTER — Ambulatory Visit: Payer: Medicare Other | Admitting: Physician Assistant

## 2018-01-23 ENCOUNTER — Other Ambulatory Visit: Payer: Self-pay | Admitting: Cardiology

## 2018-01-23 DIAGNOSIS — N289 Disorder of kidney and ureter, unspecified: Secondary | ICD-10-CM

## 2018-01-23 LAB — BASIC METABOLIC PANEL
ANION GAP: 10 (ref 5–15)
BUN: 21 mg/dL (ref 8–23)
CO2: 26 mmol/L (ref 22–32)
Calcium: 10.7 mg/dL — ABNORMAL HIGH (ref 8.9–10.3)
Chloride: 103 mmol/L (ref 98–111)
Creatinine, Ser: 1.47 mg/dL — ABNORMAL HIGH (ref 0.61–1.24)
GFR calc Af Amer: 53 mL/min — ABNORMAL LOW (ref >60)
GFR, EST NON AFRICAN AMERICAN: 46 mL/min — AB (ref >60)
Glucose, Bld: 123 mg/dL — ABNORMAL HIGH (ref 70–99)
POTASSIUM: 4.7 mmol/L (ref 3.5–5.1)
SODIUM: 139 mmol/L (ref 135–145)

## 2018-01-23 MED ORDER — TORSEMIDE 20 MG PO TABS: 40.0000 mg | ORAL_TABLET | Freq: Two times a day (BID) | ORAL | 3 refills | Status: DC

## 2018-01-23 MED ORDER — POTASSIUM CHLORIDE CRYS ER 20 MEQ PO TBCR: 40.0000 meq | EXTENDED_RELEASE_TABLET | Freq: Two times a day (BID) | ORAL | 3 refills | Status: DC

## 2018-01-23 NOTE — Progress Notes (Signed)
Pt has orders to be discharged. Discharge instructions given and pt has no additional questions at this time. Medication regimen reviewed and pt educated. Pt verbalized understanding and has no additional questions. Telemetry box removed. IV removed and site in good condition. Pt stable and waiting for transportation. 

## 2018-01-23 NOTE — Progress Notes (Signed)
Progress Note  Patient Name: Stanley [name] Date of Encounter: 01/23/2018  Primary Cardiologist: [name], MD   Subjective   Feeling well.  Wants to go home.  Inpatient Medications    Scheduled Meds: . aspirin EC  81 mg Oral Daily  . heparin  5,000 Units Subcutaneous Q8H  . latanoprost  1 drop Both Eyes QHS  . multivitamin with minerals  1 tablet Oral QHS  . pantoprazole  40 mg Oral Daily  . potassium chloride  40 mEq Oral BID  . sodium chloride flush  3 mL Intravenous Q12H   Continuous Infusions: . sodium chloride     PRN Meds: sodium chloride, acetaminophen, fluticasone, nitroGLYCERIN, ondansetron (ZOFRAN) IV, sodium chloride flush   Vital Signs    Vitals:   01/22/18 1149 01/22/18 2000 01/23/18 0504 01/23/18 0908  BP: 101/79 104/77 98/73 102/72  Pulse: 75 85 69 61  Resp: (!) 22 18 18    Temp:  97.6 F (36.4 C) 97.7 F (36.5 C)   TempSrc:  Oral Oral   SpO2: 100% 100% 97%   Weight:   191 lb 1.6 oz (86.7 kg)   Height:        Intake/Output Summary (Last 24 hours) at 01/23/2018 1011 Last data filed at 01/23/2018 0919 Gross per 24 hour  Intake 1369.2 ml  Output 1950 ml  Net -580.8 ml   Filed Weights   01/21/18 0542 01/22/18 0443 01/23/18 0504  Weight: 192 lb 11.2 oz (87.4 kg) 191 lb 8 oz (86.9 kg) 191 lb 1.6 oz (86.7 kg)    Telemetry    Sinus rhythm.  No events- Personally Reviewed  ECG    n/a - Personally Reviewed  Physical Exam   VS:  BP 102/72   Pulse 61   Temp 97.7 F (36.5 C) (Oral)   Resp 18   Ht 5' 6.5" (1.689 m)   Wt 191 lb 1.6 oz (86.7 kg)   SpO2 97%   BMI 30.38 kg/m  , BMI Body mass index is 30.38 kg/m. GENERAL:  Well appearing HEENT: Pupils equal round and reactive, fundi not visualized, oral mucosa unremarkable NECK:  No jugular venous distention, waveform within normal limits, carotid upstroke brisk and symmetric, no bruits LUNGS:  Clear to auscultation bilaterally HEART:  RRR.  PMI not displaced or sustained,S1 and S2  within normal limits, no S3, no S4, no clicks, no rubs, no murmurs ABD:  Flat, positive bowel sounds normal in frequency in pitch, no bruits, no rebound, no guarding, no midline pulsatile mass, no hepatomegaly, no splenomegaly EXT:  2 plus pulses throughout, 1+ bilateral LE edema, no cyanosis no clubbing SKIN:  No rashes no nodules NEURO:  Cranial nerves II through XII grossly intact, motor grossly intact throughout [facility]:  Cognitively intact, oriented to person place and time  Labs    Chemistry Recent Labs  Lab 01/19/18 1410 01/20/18 0242 01/20/18 0902 01/21/18 1112  NA 142  --  141 142  K 3.4*  --  3.9 4.0  CL 105  --  103 104  CO2 28  --  27 28  GLUCOSE 114*  --  132* 102*  BUN 17  --  18 23  CREATININE 1.31*  --  1.31* 1.49*  CALCIUM 10.7*  --  10.8* 11.0*  PROT  --  6.8  --   --   ALBUMIN  --  3.4*  --   --   AST  --  64*  --   --  ALT  --  41  --   --   ALKPHOS  --  118  --   --   BILITOT  --  4.7*  --   --   GFRNONAA 53*  --  53* 45*  GFRAA >60  --  >60 52*  ANIONGAP 9  --  11 10     Hematology Recent Labs  Lab 01/19/18 1410 01/20/18 0902  WBC 3.9* 3.3*  RBC 5.03 4.64  HGB 16.6 15.2  HCT 49.9 45.5  MCV 99.2 98.1  MCH 33.0 32.8  MCHC 33.3 33.4  RDW 13.2 13.3  PLT 167 149*    Cardiac Enzymes Recent Labs  Lab 01/19/18 2042 01/20/18 0242 01/20/18 0902  TROPONINI 0.14* 0.14* 0.14*    Recent Labs  Lab 01/19/18 1417  TROPIPOC 0.20*     BNP Recent Labs  Lab 01/19/18 1410  BNP 635.8*     DDimer No results for input(s): DDIMER in the last 168 hours.   Radiology    Nm Myocar Multi W/spect W/wall Motion / Ef  Result Date: 01/22/2018 CLINICAL DATA:  Chronic left ventricular dysfunction. EXAM: MYOCARDIAL IMAGING WITH SPECT (REST AND PHARMACOLOGIC-STRESS) GATED LEFT VENTRICULAR WALL MOTION STUDY LEFT VENTRICULAR EJECTION FRACTION TECHNIQUE: Standard myocardial SPECT imaging was performed after resting intravenous injection of 10 mCi Tc-54m  tetrofosmin. Subsequently, intravenous infusion of Lexiscan was performed under the supervision of the Cardiology staff. At peak effect of the drug, 30 mCi Tc-74m tetrofosmin was injected intravenously and standard myocardial SPECT imaging was performed. Quantitative gated imaging was also performed to evaluate left ventricular wall motion, and estimate left ventricular ejection fraction. COMPARISON:  12/07/2015 FINDINGS: Perfusion: No decreased activity in the left ventricle on stress imaging to suggest reversible ischemia or infarction. Wall Motion: Global hypokinesis.  Left ventricular dilatation. Left Ventricular Ejection Fraction: 25 % End diastolic volume 165 ml End systolic volume 125 ml IMPRESSION: 1. No reversible ischemia or infarction. 2. Global hypokinesis. 3. Left ventricular ejection fraction 25% 4. Non invasive risk stratification*: High risk *2012 Appropriate Use Criteria for Coronary Revascularization Focused Update: J Am Coll Cardiol. 2012;59(9):857-881. http://content.dementiazones.com.aspx?articleid=[email or web address] Electronically Signed   By: Rudie Meyer M.D.   On: 01/22/2018 15:15    Cardiac Studies   Lexiscan Myoview 01/22/18: IMPRESSION: 1. No reversible ischemia or infarction.  2. Global hypokinesis.  3. Left ventricular ejection fraction 25%  4. Non invasive risk stratification*: High risk  Patient Profile     72 y.o. male with CAD s/p PCI, hyperlipidemia here with acute on chronic systolic and diastolic heart failure.  Assessment & Plan    # Acute on chronic systolic and diastolic heart failure:  Mr. Cueva was admitted with increasing shortness of breath and heart failure symptoms despite decreasing Lasix to 120 mg daily.  He has been compliant with a low-sodium diet.  His last echo 11/2017 revealed LVEF 45 to 50% with grade 2 diastolic dysfunction.  He diuresed 2.1 L during the hospitalization and had significant improvement in his breathing.  His weight on the date  of discharge is 191 pounds.  His creatinine increased to 1.49 from 1.31 on admission.  He was transitioned to torsemide 40 mg twice daily.  Given his increase in creatinine we will hold torsemide this afternoon and 7/31.  He will resume 40 mg twice daily on 8/1.  He will need a basic metabolic panel this week and follow-up in clinic next week.  # CAD s/p PCI: # Hyperlipidemia:   Not an active issue.  Lexiscan Myoview negative.  Elevated troponin 2/2 demand ischemia.  Continue aspirin.  He is not on a beta-blocker due to hypotension.  He has a history of myalgias on statins and Zetia.  He will need to be set up in the lipid clinic for consideration of a PCSK9 inhibitor.    For questions or updates, please contact [facility] Please consult [email or web address] for contact info under Cardiology/STEMI.      Signed, Chilton Siiffany [place], MD  01/23/2018, 10:11 AM

## 2018-01-23 NOTE — Discharge Summary (Signed)
Discharge Summary    Patient ID: Stanley [name],  MRN: [id number], DOB/AGE: [birth year] 72 y.o.  Admit date: 01/19/2018 Discharge date: 01/23/2018  Primary Care Provider: Daisy Florooss, Charles Alan Primary Cardiologist: Armanda Magicraci Turner, MD  Discharge Diagnoses    Principal Problem:   Acute on chronic combined systolic and diastolic CHF (congestive heart failure) (HCC) Active Problems:   Hyperlipidemia   Elevated troponin   CAD (coronary artery disease), native coronary artery   Peripheral arterial disease (HCC)   Essential hypertension   Coronary artery disease due to lipid rich plaque  Allergies Allergies  Allergen Reactions  . Meloxicam Other (See Comments)    Joint aching   . Methocarbamol Other (See Comments)    UNKNOWN TO PATIENT  . Prednisone Shortness Of Breath  . Statins Other (See Comments) and Anaphylaxis    Myalgias, even with Crestor once weekly  . Zetia [Ezetimibe] Other (See Comments)    Full body myalgias  . Losartan     Possible contribution to dry cough   Diagnostic Studies/Procedures    Lexiscan Myoview Stress Test 01/22/18:  IMPRESSION: 1. No reversible ischemia or infarction.  2. Global hypokinesis.  3. Left ventricular ejection fraction 25%  4. Non invasive risk stratification*: High risk  History of Present Illness     Stanley [name] is a 72yo M with known chronic systolic heart failure with an EF of 40 to 45%,  CAD with PCI to RCA 2002 and peripheral arterial disease followed by Dr. Allyson SabalBerry who [facility] after feeling poorly, drained and "lousy" with moderate shortness of breath when laying on his side. Patient reported he had been taking a total of 120 mg of Lasix per day given his symptoms and LE swelling. Upon presentation, he denied chest pain, fevers, chills, nausea, vomiting and headache. On presentation to the hospital, his BNP was found to be elevated at 635.  Troponin was elevated at 0.14.  EKG with NSR HR 88 with no  acute ischemic ST-T wave abnormalities.  Given the above, patient was admitted and followed by cardiology.  Hospital Course     Hospital problems include: 1. Acute on chronic systolic and diastolic heart failure exacerbation: -Stanley [name] was admitted with increasing shortness of breath and heart failure symptoms despite increasing Lasix to 120 mg daily.  He reported being compliant with a low-sodium diet.  His last echocardiogram 11/2017 revealed LVEF of 45 to 50% with grade 2 diastolic dysfunction.  He diuresed a total of 2.1 L during the hospitalization and had significant improvement in his breathing.  His weight on the date of discharge is 191lb.  His creatinine increased from 1.31-1.49 on admission.  We will recheck BMET prior to discharge.  He was initially placed on IV Lasix 80 mg twice daily and transitioned to PO dosing however, there was question regarding absorption of Lasix therefore, patient was changed to torsemide 40 mg twice daily.  Hold torsemide this afternoon 01/23/18 and 01/24/18.  He will resume 40 mg twice daily on 01/25/2018.  Will need basic metabolic panel this week, scheduled for Friday 01/25/18 and follow-up in clinic next week scheduled for 01/31/18.   2.  CAD s/p PCI with elevated troponin: -Patient noted to have elevated troponin upon arrival. Initial iStat troponin at 0.2 however follow-up troponin levels were elevated at 0.14>0.14>0.14 -Given elevation in troponin, patient underwent Lexiscan Myoview stress test on 01/22/2018 which revealed no reversible ischemia or infarction, global hypokinesis, LVEF of 25% -Likely related  to demand ischemia secondary to above. -We will plan to continue ASA.  He is not on a beta-blocker secondary to hypotension. -He has a history of myalgias on statins and Zetia. -Will need to be set up in the lipid clinic for consideration of PCSK9 inhibitor  3. History of peripheral arterial disease: -Not an acute issue, followed by Dr. [name], last  seen 10/26/2017 with Doppler showing occluded right anterior tibial and high-grade right posterior tibial stenosis and right ABI of 0.83.  No indication for intervention at that time.    Consultants: None  The patient was seen and examined by Dr. Duke Salvia who feels it is stable and ready for discharge on 01/23/2018 with the above recommendations. _____________  Discharge Vitals Blood pressure 91/76, pulse 69, temperature 97.7 F (36.5 C), temperature source Oral, resp. rate 18, height 5' 6.5" (1.689 m), weight 191 lb 1.6 oz (86.7 kg), SpO2 100 %.  Filed Weights   01/21/18 0542 01/22/18 0443 01/23/18 0504  Weight: 192 lb 11.2 oz (87.4 kg) 191 lb 8 oz (86.9 kg) 191 lb 1.6 oz (86.7 kg)    Labs & Radiologic Studies    Basic Metabolic Panel Recent Labs    16/10/96 1112 01/22/18 1410 01/23/18 1040  NA 142  --  139  K 4.0  --  4.7  CL 104  --  103  CO2 28  --  26  GLUCOSE 102*  --  123*  BUN 23  --  21  CREATININE 1.49*  --  1.47*  CALCIUM 11.0*  --  10.7*  MG  --  1.8  --    Fasting Lipid Panel Recent Labs    01/22/18 0601  CHOL 192  HDL 32*  LDLCALC 148*  TRIG 62  CHOLHDL 6.0  ___________  Nm Myocar Multi W/spect W/wall Motion / Ef  Result Date: 01/22/2018 CLINICAL DATA:  Chronic left ventricular dysfunction. EXAM: MYOCARDIAL IMAGING WITH SPECT (REST AND PHARMACOLOGIC-STRESS) GATED LEFT VENTRICULAR WALL MOTION STUDY LEFT VENTRICULAR EJECTION FRACTION TECHNIQUE: Standard myocardial SPECT imaging was performed after resting intravenous injection of 10 mCi Tc-24m tetrofosmin. Subsequently, intravenous infusion of Lexiscan was performed under the supervision of the Cardiology staff. At peak effect of the drug, 30 mCi Tc-35m tetrofosmin was injected intravenously and standard myocardial SPECT imaging was performed. Quantitative gated imaging was also performed to evaluate left ventricular wall motion, and estimate left ventricular ejection fraction. COMPARISON:  12/07/2015 FINDINGS:  Perfusion: No decreased activity in the left ventricle on stress imaging to suggest reversible ischemia or infarction. Wall Motion: Global hypokinesis.  Left ventricular dilatation. Left Ventricular Ejection Fraction: 25 % End diastolic volume 165 ml End systolic volume 125 ml IMPRESSION: 1. No reversible ischemia or infarction. 2. Global hypokinesis. 3. Left ventricular ejection fraction 25% 4. Non invasive risk stratification*: High risk *2012 Appropriate Use Criteria for Coronary Revascularization Focused Update: J Am Coll Cardiol. 2012;59(9):857-881. http://content.dementiazones.com.aspx?articleid=[email or web address] Electronically Signed   By: Rudie Meyer M.D.   On: 01/22/2018 15:15   Disposition   Pt is being discharged home today in good condition.  Follow-up Plans & Appointments    Follow-up Information    [facility] Follow up on 01/26/2018.   Specialty:  Cardiology Why:  You will need to come to the office on 01/26/18 for a lab draw Contact information: [address] [place] [address] [phone]       Rosalio Macadamia, NP Follow up on 01/31/2018.   Specialties:  Nurse Practitioner,  Interventional Cardiology, Cardiology, Radiology Why:  Your follow up appointment will be on 01/31/18 at 1130am with Norma Fredrickson, NP.  Contact information: [address] [place] [address] [phone]          Discharge Instructions    (HEART FAILURE PATIENTS) Call MD:  Anytime you have any of the following symptoms: 1) 3 pound weight gain in 24 hours or 5 pounds in 1 week 2) shortness of breath, with or without a dry hacking cough 3) swelling in the hands, feet or stomach 4) if you have to sleep on extra pillows at night in order to breathe.   Complete by:  As directed    Call MD for:  difficulty breathing, headache or visual disturbances   Complete by:  As directed    Call MD for:  persistant dizziness or light-headedness    Complete by:  As directed    Call MD for:  persistant nausea and vomiting   Complete by:  As directed    Call MD for:  redness, tenderness, or signs of infection (pain, swelling, redness, odor or green/yellow discharge around incision site)   Complete by:  As directed    Call MD for:  severe uncontrolled pain   Complete by:  As directed    Call MD for:  temperature >100.4   Complete by:  As directed    Diet - low sodium heart healthy   Complete by:  As directed    Discharge instructions   Complete by:  As directed    You will need to hold the Torsemide 40mg  twice daily until 01/25/18 then resume twice daily regimen. Please go to the office on Friday for a blood draw to check your kidney function   Increase activity slowly   Complete by:  As directed      Discharge Medications   Allergies as of 01/23/2018      Reactions   Meloxicam Other (See Comments)   Joint aching    Methocarbamol Other (See Comments)   UNKNOWN TO PATIENT   Prednisone Shortness Of Breath   Statins Other (See Comments), Anaphylaxis   Myalgias, even with Crestor once weekly   Zetia [ezetimibe] Other (See Comments)   Full body myalgias   Losartan    Possible contribution to dry cough      Medication List    STOP taking these medications   furosemide 40 MG tablet Commonly known as:  LASIX     TAKE these medications   aspirin 81 MG tablet Take 1 tablet (81 mg total) by mouth daily.   carboxymethylcellulose 0.5 % Soln Commonly known as:  REFRESH PLUS Place 1 drop into both eyes daily as needed (dry eyes).   CENTRUM CARDIO PO Take 1 tablet by mouth at bedtime.   colchicine 0.6 MG tablet Take 0.6 mg by mouth as needed (gout flare up). GOUT   diclofenac sodium 1 % Gel Commonly known as:  VOLTAREN APPLY 4 GRAMS ON AFFECTED SKIN 4 TIMES A DAY as needed   fluticasone 50 MCG/ACT nasal spray Commonly known as:  FLONASE Place 1 spray into both nostrils daily as needed for allergies.   latanoprost  0.005 % ophthalmic solution Commonly known as:  XALATAN Place 1 drop into both eyes at bedtime.   nitroGLYCERIN 0.4 MG/SPRAY spray Commonly known as:  NITROLINGUAL Place 1 spray under the tongue every 5 (five) minutes x 3 doses as needed for chest pain.   omeprazole 20 MG capsule Commonly  known as:  PRILOSEC TAKE 1 CAPSULE BY MOUTH EVERY DAY   potassium chloride SA 20 MEQ tablet Commonly known as:  K-DUR,KLOR-CON Take 2 tablets (40 mEq total) by mouth 2 (two) times daily.   torsemide 20 MG tablet Commonly known as:  DEMADEX Take 2 tablets (40 mg total) by mouth 2 (two) times daily.        Acute coronary syndrome (MI, NSTEMI, STEMI, etc) this admission?:  No.  The elevated Troponin was due to the acute medical illness or demand ischemia.    Outstanding Labs/Studies   -BMET this week, Friday 01/26/18 and at follow up appointment next week on 01/31/18 at 1130 am with Norma Fredrickson NP.   -Will need to be set up in the lipid clinic for consideration of PCSK9 inhibitor  Duration of Discharge Encounter   Greater than 30 minutes including physician time.  Signed, Georgie Chard, NP 01/23/2018, 12:54 PM

## 2018-01-26 ENCOUNTER — Other Ambulatory Visit: Payer: Medicare Other | Admitting: *Deleted

## 2018-01-26 DIAGNOSIS — N289 Disorder of kidney and ureter, unspecified: Secondary | ICD-10-CM

## 2018-01-26 LAB — BASIC METABOLIC PANEL
BUN/Creatinine Ratio: 13 (ref 10–24)
BUN: 19 mg/dL (ref 8–27)
CO2: 26 mmol/L (ref 20–29)
CREATININE: 1.48 mg/dL — AB (ref 0.76–1.27)
Calcium: 11.5 mg/dL — ABNORMAL HIGH (ref 8.6–10.2)
Chloride: 98 mmol/L (ref 96–106)
GFR calc Af Amer: 54 mL/min/{1.73_m2} — ABNORMAL LOW (ref >59)
GFR calc non Af Amer: 47 mL/min/{1.73_m2} — ABNORMAL LOW (ref >59)
Glucose: 116 mg/dL — ABNORMAL HIGH (ref 65–99)
Potassium: 4.8 mmol/L (ref 3.5–5.2)
Sodium: 142 mmol/L (ref 134–144)

## 2018-01-31 ENCOUNTER — Telehealth: Payer: Self-pay | Admitting: *Deleted

## 2018-01-31 ENCOUNTER — Ambulatory Visit: Payer: Medicare Other | Admitting: Physician Assistant

## 2018-01-31 ENCOUNTER — Encounter: Payer: Self-pay | Admitting: Physician Assistant

## 2018-01-31 VITALS — BP 92/60 | HR 92 | Ht 66.6 in | Wt 176.6 lb

## 2018-01-31 DIAGNOSIS — I739 Peripheral vascular disease, unspecified: Secondary | ICD-10-CM

## 2018-01-31 DIAGNOSIS — I5042 Chronic combined systolic (congestive) and diastolic (congestive) heart failure: Secondary | ICD-10-CM

## 2018-01-31 DIAGNOSIS — I2510 Atherosclerotic heart disease of native coronary artery without angina pectoris: Secondary | ICD-10-CM | POA: Diagnosis not present

## 2018-01-31 DIAGNOSIS — I10 Essential (primary) hypertension: Secondary | ICD-10-CM

## 2018-01-31 DIAGNOSIS — E782 Mixed hyperlipidemia: Secondary | ICD-10-CM

## 2018-01-31 LAB — BASIC METABOLIC PANEL
BUN/Creatinine Ratio: 14 (ref 10–24)
BUN: 20 mg/dL (ref 8–27)
CO2: 25 mmol/L (ref 20–29)
Calcium: 11.3 mg/dL — ABNORMAL HIGH (ref 8.6–10.2)
Chloride: 96 mmol/L (ref 96–106)
Creatinine, Ser: 1.46 mg/dL — ABNORMAL HIGH (ref 0.76–1.27)
GFR calc Af Amer: 55 mL/min/{1.73_m2} — ABNORMAL LOW (ref >59)
GFR calc non Af Amer: 47 mL/min/{1.73_m2} — ABNORMAL LOW (ref >59)
Glucose: 128 mg/dL — ABNORMAL HIGH (ref 65–99)
Potassium: 4 mmol/L (ref 3.5–5.2)
Sodium: 140 mmol/L (ref 134–144)

## 2018-01-31 LAB — PRO B NATRIURETIC PEPTIDE: NT-Pro BNP: 2225 pg/mL — ABNORMAL HIGH (ref 0–376)

## 2018-01-31 MED ORDER — TORSEMIDE 20 MG PO TABS: 40.0000 mg | ORAL_TABLET | Freq: Every day | ORAL | 3 refills | Status: DC

## 2018-01-31 MED ORDER — POTASSIUM CHLORIDE CRYS ER 20 MEQ PO TBCR: 40.0000 meq | EXTENDED_RELEASE_TABLET | Freq: Every day | ORAL | 3 refills | Status: DC

## 2018-01-31 NOTE — Telephone Encounter (Signed)
Left a message for pt to call back re: Repatha.
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
## 2018-01-31 NOTE — Patient Instructions (Signed)
Medication Instructions:  Your physician has recommended you make the following change in your medication:  1. DECREASE TORSEMIDE TO 2 TABLETS DAILY. 2. DECREASE POTASSIUM TO 2 TABS DAILY   Labwork: TODAY: BMET, BNP  Testing/Procedures: None ordered  Follow-Up: Your physician wants you to follow-up in: 3-4 MONTHS WITH DR. Mayford KnifeURNER.   Any Other Special Instructions Will Be Listed Below (If Applicable).     If you need a refill on your cardiac medications before your next appointment, please call your pharmacy.

## 2018-01-31 NOTE — Telephone Encounter (Signed)
-----   Message from Michele M Lenze, PA-C sent at 01/31/2018  2:53 PM EDT ----- Stanley [name], the patient used to be on Repatha and it worked well for him.  Can you call him and ask him to apply for this.  Megan is tried calling him multiple times but he still has not filled out the form.  Even if they deny it has to be done.  Thanks ----- Message ----- From: Supple, Megan E, RPH Sent: 01/31/2018   1:04 PM To: Michele M Lenze, PA-C  He needs to apply for the Medicare Low Income subsidy program to see if he qualifies for patient assistance. I had reached out to him multiple times over about a 6 month period and he never followed through. Unfortunately without him filling this out, his Repatha copay was unaffordable at ~$250. If he is willing to complete this application now, he can call #[phone].  Thanks, Megan  ----- Message ----- From: Lenze, Michele M, PA-C Sent: 01/31/2018  11:32 AM To: Megan E Supple, RPH  Can you review this patient? He says repatha worked great but no longer qualifies?   

## 2018-02-01 ENCOUNTER — Other Ambulatory Visit: Payer: Self-pay | Admitting: *Deleted

## 2018-02-01 MED ORDER — TORSEMIDE 20 MG PO TABS: 60.0000 mg | ORAL_TABLET | Freq: Two times a day (BID) | ORAL | 11 refills | Status: DC

## 2018-02-01 NOTE — Progress Notes (Signed)
Per lab results 02/01/18

## 2018-02-07 ENCOUNTER — Telehealth: Payer: Self-pay | Admitting: Pharmacist

## 2018-02-07 NOTE — Telephone Encounter (Signed)
LDL remains above goal, pt is intolerant to multiple statins and PCSK9i therapy has been too expensive. Discussed upcoming ORION 4 trial with pt and he is interested in being screened. Will forward information to our research team.

## 2018-02-12 NOTE — Telephone Encounter (Signed)
-----   Message from Dyann KiefMichele M Lenze, PA-C sent at 01/31/2018  2:53 PM EDT ----- Victorino DikeJennifer, the patient used to be on Repatha and it worked well for him.  Can you call him and ask him to apply for this.  Aundra MilletMegan is tried calling him multiple times but he still has not filled out the form.  Even if they deny it has to be done.  Thanks ----- Message ----- From: Awilda MetroSupple, Megan E, [facility] LLCRPH Sent: 01/31/2018   1:04 PM To: Dyann KiefMichele M Lenze, PA-C  He needs to apply for the Medicare Low Income subsidy program to see if he qualifies for patient assistance. I had reached out to him multiple times over about a 6 month period and he never followed through. Unfortunately without him filling this out, his Repatha copay was unaffordable at ~$250. If he is willing to complete this application now, he can call #[phone].  Thanks, Aundra MilletMegan  ----- Message ----- From: Collier BullockLenze, Michele M, PA-C Sent: 01/31/2018  11:32 AM To: Awilda MetroMegan E Supple, RPH  Can you review this patient? He says repatha worked great but no longer qualifies?

## 2018-02-12 NOTE — Telephone Encounter (Signed)
Spoke with pt re: Repath Pt Assistance. He has been given the # provided for him to call to see if he can get patient assitance. Pt was told to call back and let Aundra MilletMegan, PharmD know what they say. Pt thanked me for the call.

## 2018-02-15 ENCOUNTER — Telehealth: Payer: Self-pay

## 2018-02-15 NOTE — Telephone Encounter (Signed)
   Primary Cardiologist: Armanda Magicraci Turner, MD  Chart reviewed as part of pre-operative protocol coverage. Given past medical history and time since last visit, based on ACC/AHA guidelines, Stanley [name] would be at acceptable risk for the planned procedure without further cardiovascular testing.   There is no indication for SBE prophylaxis in this pt. ASA does not need to be held.   I will route this recommendation to the requesting party via Epic fax function and remove from pre-op pool.  Please call with questions.  Robbie LisBrittainy Tyheem Boughner, PA-C 02/15/2018, 3:27 PM

## 2018-02-15 NOTE — Telephone Encounter (Signed)
   [facility] Pre-operative Risk Assessment    Request for surgical clearance:  1. What type of surgery is being performed? Dental work/ deep cleaning  2. When is this surgery scheduled? 02/21/18   3. What type of clearance is required (medical clearance vs. Pharmacy clearance to hold med vs. Both)? Pharmacy and need for SBE?  4. Are there any medications that need to be held prior to surgery and how long?ASA   5. Practice name and name of physician performing surgery? Dr. Wyline Beady DDS   6. What is your office phone number [phone]    7.   What is your office fax number             [phone]  8.   Anesthesia type (None, local, MAC, general) ? local

## 2018-02-23 ENCOUNTER — Other Ambulatory Visit: Payer: Self-pay | Admitting: Otolaryngology

## 2018-02-23 DIAGNOSIS — K117 Disturbances of salivary secretion: Secondary | ICD-10-CM

## 2018-03-01 ENCOUNTER — Ambulatory Visit
Admission: RE | Admit: 2018-03-01 | Discharge: 2018-03-01 | Disposition: A | Discharge status: Home / Self Care | Payer: Medicare Other | Source: Ambulatory Visit | Attending: Otolaryngology | Admitting: Otolaryngology

## 2018-03-01 DIAGNOSIS — K117 Disturbances of salivary secretion: Secondary | ICD-10-CM

## 2018-03-01 MED ORDER — IOPAMIDOL (ISOVUE-300) INJECTION 61%
75.0000 mL | Freq: Once | INTRAVENOUS | Status: AC | PRN
  Administered 2018-03-01: 75 mL via INTRAVENOUS

## 2018-03-05 ENCOUNTER — Encounter: Payer: Self-pay | Admitting: Cardiology

## 2018-03-05 ENCOUNTER — Ambulatory Visit: Payer: Medicare Other | Admitting: Cardiology

## 2018-03-05 VITALS — BP 120/68 | HR 74 | Ht 66.6 in | Wt 181.8 lb

## 2018-03-05 DIAGNOSIS — I2583 Coronary atherosclerosis due to lipid rich plaque: Secondary | ICD-10-CM

## 2018-03-05 DIAGNOSIS — I5042 Chronic combined systolic (congestive) and diastolic (congestive) heart failure: Secondary | ICD-10-CM | POA: Diagnosis not present

## 2018-03-05 DIAGNOSIS — I739 Peripheral vascular disease, unspecified: Secondary | ICD-10-CM

## 2018-03-05 DIAGNOSIS — I2510 Atherosclerotic heart disease of native coronary artery without angina pectoris: Secondary | ICD-10-CM | POA: Diagnosis not present

## 2018-03-05 DIAGNOSIS — I10 Essential (primary) hypertension: Secondary | ICD-10-CM

## 2018-03-05 DIAGNOSIS — E7800 Pure hypercholesterolemia, unspecified: Secondary | ICD-10-CM

## 2018-03-05 LAB — BASIC METABOLIC PANEL
BUN/Creatinine Ratio: 17 (ref 10–24)
BUN: 22 mg/dL (ref 8–27)
CALCIUM: 11.2 mg/dL — AB (ref 8.6–10.2)
CO2: 25 mmol/L (ref 20–29)
CREATININE: 1.32 mg/dL — AB (ref 0.76–1.27)
Chloride: 99 mmol/L (ref 96–106)
GFR calc Af Amer: 62 mL/min/{1.73_m2} (ref >59)
GFR calc non Af Amer: 54 mL/min/{1.73_m2} — ABNORMAL LOW (ref >59)
GLUCOSE: 108 mg/dL — AB (ref 65–99)
Potassium: 4.2 mmol/L (ref 3.5–5.2)
Sodium: 141 mmol/L (ref 134–144)

## 2018-03-05 MED ORDER — POTASSIUM CHLORIDE 20 MEQ/15ML (10%) PO SOLN: 40.0000 meq | Freq: Every day | ORAL | 11 refills | Status: DC

## 2018-03-05 MED ORDER — TORSEMIDE 20 MG PO TABS: 40.0000 mg | ORAL_TABLET | Freq: Every day | ORAL | 3 refills | Status: DC
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
## 2018-03-05 NOTE — Patient Instructions (Signed)
Medication Instructions:  Changed Potassium to liquid Decrease Torsemide to 40 mg daily   Labwork: Today: BMET  Follow-Up: Your physician wants you to follow-up in: 6 months with Dr. [name]. You will receive a reminder letter in the mail two months in advance. If you don't receive a letter, please call our office to schedule the follow-up appointment.   Any Other Special Instructions Will Be Listed Below (If Applicable).     If you need a refill on your cardiac medications before your next appointment, please call your pharmacy.

## 2018-04-05 VITALS — BP 101/72 | HR 85 | Ht 66.5 in | Wt 182.6 lb

## 2018-04-05 DIAGNOSIS — Z006 Encounter for examination for normal comparison and control in clinical research program: Secondary | ICD-10-CM

## 2018-04-05 NOTE — Research (Addendum)
The informed consent form, study requirements and expectations were reviewed with the subject and questions and concerns were addressed prior to the signing of the consent form.  The subject verbalized understanding of the trial requirements.  The subject agreed to participate in the [facility] trial and signed the informed consent.  The informed consent was obtained prior to performance of any protocol-specific procedures for the subject.  A copy of the signed informed consent was given to the subject and a copy was placed in the subject's medical record.  [facility] Screening Visit  Patient Name Stanley [name] DOB 12-22-1945  Subject ID# [id number]  Visit Date/Informed Consent Date 04/05/18  Demographics      Sex male   Ethnicity Not Hispanic or Latino  Age 72 y.o.  Race _0 White _1 Black or African American _2 Asian _3 American [place] and [place] Native _4 Native Hawaiian _5 Other Pacific Islander  Tobacco use  _6 never _7 current _8 former Type  _9 Cigarettes _10 Cigars _11 E-cigarettes _12 Smokeless  Quantity and unit ____ Frequency  _13 Daily _14 Every week _15 Every month _16 Occasional _17 Other Duration ____  _18 Days _19 Weeks _20 Months _21 Years  Local Labs  Collection Date  04/05/18  Patient Fasting   _22 Yes   _23 No _24 Urine pregnancy test _25 Chemistry _26 Fasting lipid panel    04/19/2018 6.1 Inclusion Criteria Subjects are eligible to be included in the study only if all of the following criteria apply:  _27 101 Subject has provided informed consent prior to initiation of any study specific            activities/procedures  _28 102 Adult subjects ? 66 years (men) or ? 63 years (women) to < 48 years of age            (either sex)  _29 60 Subjects must have an LDL-C ? 100 mg/dL (? 2.6 mmol/L) or            non-HDL-C ? 130 mg/dL (? 3.4 mmol/L) at screening, after ? 4 weeks of            optimized lipid-lowering therapy   _30 104 Diagnostic evidence of at least 1 of the following (A - D) at  screening: _31 A. Significant coronary artery disease meeting at least 1 of the following  criteria: _32  History of coronary revascularization with multi-vessel coronary       disease as evidenced by any of the following: _33  (a) multi-vessel percutaneous coronary intervention (PCI) _34  (b) PCI or coronary artery bypass grafting (CABG) with            residual  ? 50% stenosis in a separate, unrevascularized           segment or vessel, Or   _35  (c) multi-vessel CABG at least 5 years prior to screening _36  Significant coronary disease without prior revascularization as       evidenced by either a ? 70% stenosis of at least 1 coronary artery,       ? 50% stenosis of 2 or more coronary arteries, or ? 50% stenosis of       the left main coronary artery _37  known coronary artery calcium score ? 100 _38 B. Significant atherosclerotic cerebrovascular disease meeting at least 1 of          the following criteria: _39  prior transient ischemic attack with ? 50% carotid stenosis _40  carotid artery stenosis of ? 70% or 2 or more ? 50% stenosis _41  prior carotid artery revascularization _42 C. Significant peripheral arterial disease meeting at least 1 of the following          criteria: _43  ?  50% stenosis in a limb artery _0  history of abdominal aorta treatment (percutaneous and surgical) _1  ankle brachial index (ABI) < 0.85 _2 D. Diabetes mellitus with at least 1 of the following: _3  known microvascular disease, defined by diabetic nephropathy or      treated retinopathy. Diabetic nephropathy defined as      microalbuminuria (urinary albumin to creatinine ratio ? 67m/g)      and/or estimated glomerular filtration rate      (eGFR) < 60 mL/min/1.73 m2 _4  chronic treatment with insulin _5  diabetes diagnosis ? 10 years ago  _6 105 At least 1 of the following high risk criteria at screening (most recent lab             values prior to screening, as applicable): <<[id number]>_0<\/[id number]>_9 polyvascular disease, defined as coronary,  carotid, or peripheral artery      stenosis ? 50% in a second distinct vascular location in a patient with      coronary, cerebral or peripheral arterial disease (inclusion criterion 104 A-C) _8  diabetes or known evidence of metabolic syndrome (Section 12.9) in a      subject with coronary, cerebral, or peripheral artery disease (inclusion      criterion 104 A-C) _9  at least 1 coronary, carotid, or peripheral artery stenosis of ? 50% in a       patient with diabetes meeting inclusion criterion 104 D _10 LDL ? 130 mg/dL (? 3.4 mmol/L) or non-HDL ? 160 mg/dL (> 4.2 mmol/L) _11  lipoprotein (a) > 125 nmol/L (50 mg/dL) _12  known familial hypercholesterolemia _13  family history of premature coronary artery disease defined as an MI or     CABG in the subject's father or brother at age < 515years or an MI or CABG     in the subject's mother or sister at age < 649years _14  high sensitive c-reactive protein ? 3.0 mg/L _15  current tobacco use _16  ? 72years of age _17  menopause before 72years of age _18  eGFR 15 to < 45 mL/min/1.73 m2   6.2 Exclusion Criteria Subjects are excluded from the study if any of the following criteria apply:  Disease Related _19 201 MI or stroke prior to randomization _20 202 CABG < 3 months prior to screening _21 203 Uncontrolled or recurrent ventricular tachycardia _22 204 Atrial fibrillation not on anticoagulation therapy _23 205 Uncontrolled hypertension (sitting systolic blood pressure > 1563mmHg or diastolic blood pressure > 110 mmHg) at screening _24 206 Last measured left-ventricular ejection fraction < 30% or New York Heart Association (NYHA) Functional Class III/IV Diagnostic Assessments _25 207 Fasting triglycerides ? 500 mg/dL (5.7 mmol/L) at screening _26 208 End stage renal disease (ESRD), defined as an eGFR < 15 mL/min/1.73 m2            Or receiving dialysis at screening  Other Medical Conditions _27 209 Malignancy, except non-melanoma skin cancers, or in situ cancers of  the cervix, prostate, or breast duct within 5 years prior to screening _28 210 History or evidence of clinically significant disease (eg, malignancy, respiratory, gastrointestinal, renal or psychiatric disease) or unstable disorder that, in the Opinion of the investigator(s), [employer] physician or designee would pose  a risk to the patient's safety or interfere with the study assessments,  procedures, completion, or result in a life expectancy of less than 1 year _29 211 Previously received or receiving evolocumab or any other therapy to inhibit PCSK9 _30 212 Previously received a cholesterol ester transfer protein (CETP) inhibitor (ie, anacetrapib, dalcetrapib, evacetrapib), mipomersen, lomitapide, or has undergone LDL-apheresis in the last 12 months prior  to LDL-C screening  Prior/Concurrent Clinical Study Experience _0 213 Currently receiving treatment in another investigational device or drug study, or less than 30 days since ending treatment on another investigational device or drug study(ies).  Other Exclusions _1 42 Male subject is pregnant, had a positive pregnancy test at screening, breastfeeding, or planning to become pregnant or breastfeed during treatment and for an additional 15 weeks after the last dose of investigational product. _2 31 Male subjects of childbearing potential unwilling to use 1 acceptable method             of effective contraception during treatment and for an additional 15 weeks after             the last dose of investigational product.  _3 216 Subject has known sensitivity to any of the products or components to be administered during dosing. _4 217 Subject likely to not be available to complete all protocol-required study visits or procedures, and/or to comply with all required study procedures to the best of  The subject and investigator's knowledge.  _5 218 Subject is staff personal directly involved with the study or is a family member of the  investigational study staff  *Unable to reach patient at this time, screening period of 14 days has passed.  Will continue to attempt to contact patient for re-screening in the future. Wallie Renshaw, RN 04/19/18 3:59 PM

## 2018-04-16 LAB — LIPID PANEL
CHOL/HDL RATIO: 3.9 ratio (ref 0.0–5.0)
Cholesterol, Total: 181 mg/dL (ref 100–199)
HDL: 46 mg/dL (ref >39)
LDL Calculated: 121 mg/dL — ABNORMAL HIGH (ref 0–99)
TRIGLYCERIDES: 70 mg/dL (ref 0–149)
VLDL Cholesterol Cal: 14 mg/dL (ref 5–40)

## 2018-04-16 LAB — COMPREHENSIVE METABOLIC PANEL
ALT: 47 IU/L — AB (ref 0–44)
AST: 55 IU/L — AB (ref 0–40)
Albumin/Globulin Ratio: 1.4 (ref 1.2–2.2)
Albumin: 4.1 g/dL (ref 3.5–4.8)
Alkaline Phosphatase: 163 IU/L — ABNORMAL HIGH (ref 39–117)
BUN/Creatinine Ratio: 17 (ref 10–24)
BUN: 19 mg/dL (ref 8–27)
Bilirubin Total: 3.4 mg/dL — ABNORMAL HIGH (ref 0.0–1.2)
CO2: 23 mmol/L (ref 20–29)
CREATININE: 1.15 mg/dL (ref 0.76–1.27)
Calcium: 10.3 mg/dL — ABNORMAL HIGH (ref 8.6–10.2)
Chloride: 99 mmol/L (ref 96–106)
GFR calc Af Amer: 73 mL/min/{1.73_m2} (ref >59)
GFR, EST NON AFRICAN AMERICAN: 63 mL/min/{1.73_m2} (ref >59)
GLOBULIN, TOTAL: 2.9 g/dL (ref 1.5–4.5)
Glucose: 125 mg/dL — ABNORMAL HIGH (ref 65–99)
Potassium: 3.7 mmol/L (ref 3.5–5.2)
SODIUM: 144 mmol/L (ref 134–144)
Total Protein: 7 g/dL (ref 6.0–8.5)

## 2018-04-19 NOTE — Research (Signed)
[facility] Physical Exam   Patient Name: Stanley [name]        DOB: 10/19/1945   Normal     Abnormal      [x]                   []        General Condition                                       Describe Abnormal Findings                                         Clinically Significant?    [] Y    [] N       [x]                   []        HEENT                                       Describe Abnormal Findings                                         Clinically Significant?    [] Y    [] N       [x]                   []        Integumentary                                       Describe Abnormal Findings                                        Clinically Significant?    [] Y    [] N      [x]                   []        Musculoskeletal                                       Describe Abnormal Findings                                        Clinically Significant?    [] Y    [] N      [x]                   []        Cardiovascular                                       Describe Abnormal Findings  Clinically Significant?    [] Y    [] N      [x]                   []        Peripheral Vascular                                       Describe Abnormal Findings                                        Clinically Significant?    [] Y    [] N      [x]                   []        Respiratory                                       Describe Abnormal Findings                                        Clinically Significant?    [] Y    [] N      [x]                   []        Gastrointestinal                                       Describe Abnormal Findings                                        Clinically Significant?    [] Y    [] N      [x]                   []        Neurological                                       Describe Abnormal Findings                                        Clinically Significant?    [] Y    [] N      [x]                   []        Renal/Urinary            Describe Abnormal Findings                                        Clinically Significant?    [] Y    [] N      []                   []   Other (check normal if no other findings)                                       Describe Abnormal Findings                                        Clinically Significant?    [] Y    [] N  Modified Rankin Scale  [x] 0 - No Symptoms [] 1 - No significant disability despite symptoms; able to carry out all usual duties  and activities [] 2 - Slight disability; unable to carry out all previous activities, but able to look after own affairs without assistance [] 3 - Moderate disability; requiring some help, but able to walk without assistance [] 4 - Moderately severe disability; unable to walk without assistance and unable to  attend to own bodily needs without assistance [] 5 - Severe disability; bedridden, incontinent and requiring constant nursing care and attention [] 6 - Dead  Patient seen and examined.  Study discussed including nature of randomization, requirement for randomization.    Arturo Morton. Riley Kill, MD [facility] Medical Director, [facility]

## 2018-04-24 VITALS — BP 100/66 | HR 80 | Ht 66.0 in | Wt 183.2 lb

## 2018-04-24 DIAGNOSIS — Z006 Encounter for examination for normal comparison and control in clinical research program: Secondary | ICD-10-CM

## 2018-04-24 NOTE — Research (Addendum)
Subject met inclusion and exclusion criteria.  The informed consent form, study requirements and expectations were reviewed with the subject and questions and concerns were addressed prior to the signing of the consent form.  The subject verbalized understanding of the trial requirements.  The subject agreed to participate in the [facility] trial and signed the informed consent.  The informed consent was obtained prior to performance of any protocol-specific procedures for the subject.  A copy of the signed informed consent was given to the subject and a copy was placed in the subject's medical record.  [facility] Screening Visit  Patient Name Stanley [name] DOB 06/16/1946  Subject ID# [id number]  Visit Date/Informed Consent Date 04/24/18  Demographics      Sex male   Ethnicity Not Hispanic or Latino  Age 72 y.o.  Race []White [x]Black or African American []Asian []American Indian and [place] Native []Native Hawaiian []Other Pacific Islander  Tobacco use  [x]never []current []former Type  []Cigarettes []Cigars []E-cigarettes []Smokeless  Quantity and unit ____ Frequency  []Daily []Every week []Every month []Occasional []Other Duration ____  []Days []Weeks []Months []Years  Local Labs  Collection Date  04/24/18  Patient Fasting   [x]Yes   []No []Urine pregnancy test [x]Chemistry [x]Fasting lipid panel  Placebo injection Box # [id number] Administration time 0925 [x]AM []PM in clinic Quantity Administered    []None []Partial [x]Full Reason for None or Partial Dose ____ Administration Site [x]Abdomen []Arm []Thigh Laterality []Left  [x]Right Administered by [x]Patient []Caregiver     

## 2018-04-25 VITALS — BP 90/63 | HR 78 | Wt 182.2 lb

## 2018-04-25 DIAGNOSIS — Z006 Encounter for examination for normal comparison and control in clinical research program: Secondary | ICD-10-CM

## 2018-04-25 LAB — LIPID PANEL
CHOL/HDL RATIO: 4.7 ratio (ref 0.0–5.0)
Cholesterol, Total: 193 mg/dL (ref 100–199)
HDL: 41 mg/dL (ref >39)
LDL Calculated: 138 mg/dL — ABNORMAL HIGH (ref 0–99)
Triglycerides: 71 mg/dL (ref 0–149)
VLDL Cholesterol Cal: 14 mg/dL (ref 5–40)

## 2018-04-25 LAB — COMPREHENSIVE METABOLIC PANEL
A/G RATIO: 1.3 (ref 1.2–2.2)
ALT: 35 IU/L (ref 0–44)
AST: 65 IU/L — ABNORMAL HIGH (ref 0–40)
Albumin: 3.8 g/dL (ref 3.5–4.8)
Alkaline Phosphatase: 152 IU/L — ABNORMAL HIGH (ref 39–117)
BUN / CREAT RATIO: 19 (ref 10–24)
BUN: 25 mg/dL (ref 8–27)
Bilirubin Total: 2.5 mg/dL — ABNORMAL HIGH (ref 0.0–1.2)
CALCIUM: 10 mg/dL (ref 8.6–10.2)
CO2: 20 mmol/L (ref 20–29)
Chloride: 101 mmol/L (ref 96–106)
Creatinine, Ser: 1.34 mg/dL — ABNORMAL HIGH (ref 0.76–1.27)
GFR calc Af Amer: 61 mL/min/{1.73_m2} (ref >59)
GFR, EST NON AFRICAN AMERICAN: 53 mL/min/{1.73_m2} — AB (ref >59)
GLOBULIN, TOTAL: 2.9 g/dL (ref 1.5–4.5)
Glucose: 114 mg/dL — ABNORMAL HIGH (ref 65–99)
POTASSIUM: 4.2 mmol/L (ref 3.5–5.2)
SODIUM: 141 mmol/L (ref 134–144)
Total Protein: 6.7 g/dL (ref 6.0–8.5)

## 2018-04-25 NOTE — Research (Signed)
[facility] Day 1 Visit-Randomization  Patient Name Stanley [name] Subject ID# [id number] Randomization# [id number]  Visit Date 04/25/18   IP Dispensation: Date dispensed 04/25/18 Number dispensed: 8 pens total Box # [id number] (4 pens)  and [id number] (4 pens)  IP Administration in Clinic: Box # [id number] Administration time 1310 [] AM [x] PM in clinic Quantity Administered    [] None [] Partial [x] Full Reason for None or Partial Dose ____ Administration Site [x] Abdomen [] Arm [] Thigh Laterality [] Left  [x] Right Administered by [x] Patient [] Caregiver  [facility] [x] Lipid panel, non-fasting [x] Biomarker sample (optional) [x] Pharmacogenetic sample(optional)  Patient provided with: Subject welcome letter, subject wallet card, subject informational brochure, instructions for use, reference guide, white IP storage box and tote bag.

## 2018-05-23 ENCOUNTER — Other Ambulatory Visit: Payer: Self-pay | Admitting: *Deleted

## 2018-05-23 MED ORDER — AMBULATORY NON FORMULARY MEDICATION: 140.0000 mg | Status: DC

## 2018-05-31 ENCOUNTER — Encounter: Payer: Self-pay | Admitting: *Deleted

## 2018-06-05 ENCOUNTER — Telehealth: Payer: Self-pay | Admitting: Cardiology

## 2018-06-05 NOTE — Telephone Encounter (Signed)
New Message   Pt c/o medication issue:  1. Name of Medication: torsemide (DEMADEX) 20 MG tablet  2. How are you currently taking this medication (dosage and times per day)? Take 2 tablets (40 mg total) by mouth daily.  3. Are you having a reaction (difficulty breathing--STAT)? no  4. What is your medication issue? Pt states that he is still having swelling and the medication is not working and wold like to speak to a nurse about it

## 2018-06-05 NOTE — Telephone Encounter (Signed)
Spoke with the patient, he has been having leg and foot swelling for a while. He said the torsemide is not reducing it. He stated he is on a low salt diet and eats at home the majority of the time. His feet are cold and he said the swelling disrupts his gait. He states his weight has been fairly constant. He denies any other symptoms. Sending to Dr. Mayford Knifeurner for recommendations.

## 2018-06-05 NOTE — Telephone Encounter (Signed)
What has his weight been

## 2018-06-07 NOTE — Telephone Encounter (Signed)
His weight is usually between 181-185 lbs.

## 2018-06-07 NOTE — Telephone Encounter (Signed)
This is his baseline ?

## 2018-06-07 NOTE — Telephone Encounter (Signed)
Is this a baseline normal weight for him or is his weight up and by how much

## 2018-06-07 NOTE — Telephone Encounter (Signed)
Left a detailed message to get appointment with PCP.

## 2018-06-07 NOTE — Telephone Encounter (Signed)
Please get him in with his PCP tomorrow

## 2018-06-08 NOTE — Telephone Encounter (Signed)
Spoke with the patient, he going to see PCP.

## 2018-07-02 ENCOUNTER — Emergency Department (HOSPITAL_COMMUNITY): Payer: Medicare Other | Admitting: Anesthesiology

## 2018-07-02 ENCOUNTER — Emergency Department (HOSPITAL_COMMUNITY): Payer: Medicare Other

## 2018-07-02 ENCOUNTER — Other Ambulatory Visit: Payer: Self-pay

## 2018-07-02 ENCOUNTER — Encounter (HOSPITAL_COMMUNITY): Admission: EM | Disposition: A | Discharge status: Home / Self Care | Payer: Self-pay | Source: Home / Self Care

## 2018-07-02 ENCOUNTER — Inpatient Hospital Stay (HOSPITAL_COMMUNITY)
Admission: EM | Admit: 2018-07-02 | Discharge: 2018-07-04 | DRG: 354 | Disposition: A | Discharge status: Home / Self Care | Payer: Medicare Other | Attending: General Surgery | Admitting: General Surgery

## 2018-07-02 DIAGNOSIS — I509 Heart failure, unspecified: Secondary | ICD-10-CM

## 2018-07-02 DIAGNOSIS — I2583 Coronary atherosclerosis due to lipid rich plaque: Secondary | ICD-10-CM | POA: Diagnosis present

## 2018-07-02 DIAGNOSIS — Z951 Presence of aortocoronary bypass graft: Secondary | ICD-10-CM | POA: Diagnosis not present

## 2018-07-02 DIAGNOSIS — I130 Hypertensive heart and chronic kidney disease with heart failure and stage 1 through stage 4 chronic kidney disease, or unspecified chronic kidney disease: Secondary | ICD-10-CM | POA: Diagnosis present

## 2018-07-02 DIAGNOSIS — E785 Hyperlipidemia, unspecified: Secondary | ICD-10-CM | POA: Diagnosis present

## 2018-07-02 DIAGNOSIS — R7989 Other specified abnormal findings of blood chemistry: Secondary | ICD-10-CM | POA: Diagnosis present

## 2018-07-02 DIAGNOSIS — N183 Chronic kidney disease, stage 3 unspecified: Secondary | ICD-10-CM | POA: Diagnosis present

## 2018-07-02 DIAGNOSIS — Z7982 Long term (current) use of aspirin: Secondary | ICD-10-CM

## 2018-07-02 DIAGNOSIS — M109 Gout, unspecified: Secondary | ICD-10-CM | POA: Diagnosis present

## 2018-07-02 DIAGNOSIS — K56609 Unspecified intestinal obstruction, unspecified as to partial versus complete obstruction: Secondary | ICD-10-CM | POA: Diagnosis present

## 2018-07-02 DIAGNOSIS — E213 Hyperparathyroidism, unspecified: Secondary | ICD-10-CM | POA: Diagnosis present

## 2018-07-02 DIAGNOSIS — R739 Hyperglycemia, unspecified: Secondary | ICD-10-CM | POA: Diagnosis present

## 2018-07-02 DIAGNOSIS — Z955 Presence of coronary angioplasty implant and graft: Secondary | ICD-10-CM

## 2018-07-02 DIAGNOSIS — Z9049 Acquired absence of other specified parts of digestive tract: Secondary | ICD-10-CM | POA: Diagnosis not present

## 2018-07-02 DIAGNOSIS — Z8349 Family history of other endocrine, nutritional and metabolic diseases: Secondary | ICD-10-CM

## 2018-07-02 DIAGNOSIS — I10 Essential (primary) hypertension: Secondary | ICD-10-CM | POA: Diagnosis present

## 2018-07-02 DIAGNOSIS — Z7951 Long term (current) use of inhaled steroids: Secondary | ICD-10-CM | POA: Diagnosis not present

## 2018-07-02 DIAGNOSIS — I2510 Atherosclerotic heart disease of native coronary artery without angina pectoris: Secondary | ICD-10-CM | POA: Diagnosis present

## 2018-07-02 DIAGNOSIS — R109 Unspecified abdominal pain: Secondary | ICD-10-CM

## 2018-07-02 DIAGNOSIS — Z8249 Family history of ischemic heart disease and other diseases of the circulatory system: Secondary | ICD-10-CM | POA: Diagnosis not present

## 2018-07-02 DIAGNOSIS — K760 Fatty (change of) liver, not elsewhere classified: Secondary | ICD-10-CM | POA: Diagnosis present

## 2018-07-02 DIAGNOSIS — I5042 Chronic combined systolic (congestive) and diastolic (congestive) heart failure: Secondary | ICD-10-CM | POA: Diagnosis present

## 2018-07-02 DIAGNOSIS — Z6827 Body mass index (BMI) 27.0-27.9, adult: Secondary | ICD-10-CM | POA: Diagnosis not present

## 2018-07-02 DIAGNOSIS — Z888 Allergy status to other drugs, medicaments and biological substances status: Secondary | ICD-10-CM

## 2018-07-02 DIAGNOSIS — R1084 Generalized abdominal pain: Secondary | ICD-10-CM | POA: Diagnosis not present

## 2018-07-02 DIAGNOSIS — Z79899 Other long term (current) drug therapy: Secondary | ICD-10-CM

## 2018-07-02 DIAGNOSIS — K460 Unspecified abdominal hernia with obstruction, without gangrene: Secondary | ICD-10-CM

## 2018-07-02 DIAGNOSIS — I739 Peripheral vascular disease, unspecified: Secondary | ICD-10-CM | POA: Diagnosis present

## 2018-07-02 DIAGNOSIS — K436 Other and unspecified ventral hernia with obstruction, without gangrene: Secondary | ICD-10-CM | POA: Diagnosis present

## 2018-07-02 DIAGNOSIS — E440 Moderate protein-calorie malnutrition: Secondary | ICD-10-CM

## 2018-07-02 DIAGNOSIS — H409 Unspecified glaucoma: Secondary | ICD-10-CM | POA: Diagnosis present

## 2018-07-02 DIAGNOSIS — R778 Other specified abnormalities of plasma proteins: Secondary | ICD-10-CM

## 2018-07-02 HISTORY — PX: LAPAROTOMY: SHX154

## 2018-07-02 LAB — URINALYSIS, ROUTINE W REFLEX MICROSCOPIC
Glucose, UA: NEGATIVE mg/dL
Ketones, ur: 5 mg/dL — AB
Leukocytes, UA: NEGATIVE
Nitrite: NEGATIVE
Protein, ur: NEGATIVE mg/dL
Specific Gravity, Urine: 1.017 (ref 1.005–1.030)
pH: 5 (ref 5.0–8.0)

## 2018-07-02 LAB — I-STAT CG4 LACTIC ACID, ED
LACTIC ACID, VENOUS: 1.9 mmol/L (ref 0.5–1.9)
Lactic Acid, Venous: 2.75 mmol/L (ref 0.5–1.9)

## 2018-07-02 LAB — CBC WITH DIFFERENTIAL/PLATELET
Abs Immature Granulocytes: 0.03 K/uL (ref 0.00–0.07)
Basophils Absolute: 0 K/uL (ref 0.0–0.1)
Basophils Relative: 0 %
Eosinophils Absolute: 0 K/uL (ref 0.0–0.5)
Eosinophils Relative: 1 %
HCT: 48.7 % (ref 39.0–52.0)
Hemoglobin: 16.2 g/dL (ref 13.0–17.0)
Immature Granulocytes: 0 %
Lymphocytes Relative: 9 %
Lymphs Abs: 0.6 K/uL — ABNORMAL LOW (ref 0.7–4.0)
MCH: 33.2 pg (ref 26.0–34.0)
MCHC: 33.3 g/dL (ref 30.0–36.0)
MCV: 99.8 fL (ref 80.0–100.0)
Monocytes Absolute: 1 K/uL (ref 0.1–1.0)
Monocytes Relative: 14 %
Neutro Abs: 5.6 K/uL (ref 1.7–7.7)
Neutrophils Relative %: 76 %
Platelets: 253 K/uL (ref 150–400)
RBC: 4.88 MIL/uL (ref 4.22–5.81)
RDW: 12.3 % (ref 11.5–15.5)
WBC: 7.3 K/uL (ref 4.0–10.5)
nRBC: 0 % (ref 0.0–0.2)

## 2018-07-02 LAB — I-STAT TROPONIN, ED
Troponin i, poc: 0.26 ng/mL (ref 0.00–0.08)
Troponin i, poc: 0.28 ng/mL (ref 0.00–0.08)

## 2018-07-02 LAB — COMPREHENSIVE METABOLIC PANEL
ALT: 36 U/L (ref 0–44)
AST: 58 U/L — ABNORMAL HIGH (ref 15–41)
Albumin: 3.4 g/dL — ABNORMAL LOW (ref 3.5–5.0)
Alkaline Phosphatase: 148 U/L — ABNORMAL HIGH (ref 38–126)
Anion gap: 15 (ref 5–15)
BILIRUBIN TOTAL: 9.3 mg/dL — AB (ref 0.3–1.2)
BUN: 29 mg/dL — ABNORMAL HIGH (ref 8–23)
CO2: 30 mmol/L (ref 22–32)
Calcium: 10.7 mg/dL — ABNORMAL HIGH (ref 8.9–10.3)
Chloride: 93 mmol/L — ABNORMAL LOW (ref 98–111)
Creatinine, Ser: 1.5 mg/dL — ABNORMAL HIGH (ref 0.61–1.24)
GFR calc Af Amer: 53 mL/min — ABNORMAL LOW (ref >60)
GFR calc non Af Amer: 46 mL/min — ABNORMAL LOW (ref >60)
Glucose, Bld: 124 mg/dL — ABNORMAL HIGH (ref 70–99)
Potassium: 3.3 mmol/L — ABNORMAL LOW (ref 3.5–5.1)
Sodium: 138 mmol/L (ref 135–145)
TOTAL PROTEIN: 7.6 g/dL (ref 6.5–8.1)

## 2018-07-02 LAB — LIPASE, BLOOD: LIPASE: 77 U/L — AB (ref 11–51)

## 2018-07-02 LAB — BRAIN NATRIURETIC PEPTIDE: B Natriuretic Peptide: 956.8 pg/mL — ABNORMAL HIGH (ref 0.0–100.0)

## 2018-07-02 SURGERY — LAPAROTOMY, EXPLORATORY
Anesthesia: General | Site: Abdomen

## 2018-07-02 MED ORDER — LIDOCAINE 2% (20 MG/ML) 5 ML SYRINGE
INTRAMUSCULAR | Status: AC
  Filled 2018-07-02: qty 5

## 2018-07-02 MED ORDER — POLYVINYL ALCOHOL 1.4 % OP SOLN: 1.0000 [drp] | OPHTHALMIC | Status: DC | PRN

## 2018-07-02 MED ORDER — ONDANSETRON HCL 4 MG/2ML IJ SOLN
4.0000 mg | Freq: Once | INTRAMUSCULAR | Status: AC
  Administered 2018-07-02: 4 mg via INTRAVENOUS
  Filled 2018-07-02: qty 2

## 2018-07-02 MED ORDER — FENTANYL CITRATE (PF) 250 MCG/5ML IJ SOLN
INTRAMUSCULAR | Status: AC
  Filled 2018-07-02: qty 5

## 2018-07-02 MED ORDER — DEXAMETHASONE SODIUM PHOSPHATE 10 MG/ML IJ SOLN
INTRAMUSCULAR | Status: AC
  Filled 2018-07-02: qty 1

## 2018-07-02 MED ORDER — SODIUM CHLORIDE 0.9 % IV BOLUS
500.0000 mL | Freq: Once | INTRAVENOUS | Status: AC
  Administered 2018-07-02: 500 mL via INTRAVENOUS

## 2018-07-02 MED ORDER — SODIUM CHLORIDE (PF) 0.9 % IJ SOLN
INTRAMUSCULAR | Status: AC
  Filled 2018-07-02: qty 50

## 2018-07-02 MED ORDER — PIPERACILLIN-TAZOBACTAM 3.375 G IVPB
INTRAVENOUS | Status: AC
  Filled 2018-07-02: qty 50

## 2018-07-02 MED ORDER — ROCURONIUM BROMIDE 10 MG/ML (PF) SYRINGE
PREFILLED_SYRINGE | INTRAVENOUS | Status: DC | PRN
  Administered 2018-07-02: 50 mg via INTRAVENOUS

## 2018-07-02 MED ORDER — ONDANSETRON HCL 4 MG/2ML IJ SOLN
INTRAMUSCULAR | Status: AC
  Filled 2018-07-02: qty 2

## 2018-07-02 MED ORDER — SUGAMMADEX SODIUM 200 MG/2ML IV SOLN
INTRAVENOUS | Status: DC | PRN
  Administered 2018-07-02: 200 mg via INTRAVENOUS

## 2018-07-02 MED ORDER — PROPOFOL 10 MG/ML IV BOLUS
INTRAVENOUS | Status: DC | PRN
  Administered 2018-07-02: 20 mg via INTRAVENOUS

## 2018-07-02 MED ORDER — FENTANYL CITRATE (PF) 100 MCG/2ML IJ SOLN
INTRAMUSCULAR | Status: DC | PRN
  Administered 2018-07-02: 50 ug via INTRAVENOUS
  Administered 2018-07-02: 25 ug via INTRAVENOUS

## 2018-07-02 MED ORDER — CARBOXYMETHYLCELLULOSE SODIUM 0.5 % OP SOLN: 1.0000 [drp] | Freq: Every day | OPHTHALMIC | Status: DC | PRN

## 2018-07-02 MED ORDER — SUCCINYLCHOLINE CHLORIDE 200 MG/10ML IV SOSY
PREFILLED_SYRINGE | INTRAVENOUS | Status: AC
  Filled 2018-07-02: qty 10

## 2018-07-02 MED ORDER — FENTANYL CITRATE (PF) 100 MCG/2ML IJ SOLN
50.0000 ug | Freq: Once | INTRAMUSCULAR | Status: AC
  Administered 2018-07-02: 50 ug via INTRAVENOUS
  Filled 2018-07-02: qty 2

## 2018-07-02 MED ORDER — SODIUM CHLORIDE 0.9 % IV SOLN
INTRAVENOUS | Status: DC
  Administered 2018-07-04: 02:00:00 via INTRAVENOUS

## 2018-07-02 MED ORDER — MORPHINE SULFATE (PF) 2 MG/ML IV SOLN: 2.0000 mg | INTRAVENOUS | Status: DC | PRN

## 2018-07-02 MED ORDER — SODIUM CHLORIDE 0.9 % IR SOLN
Status: DC | PRN
  Administered 2018-07-02: 2000 mL

## 2018-07-02 MED ORDER — SUCCINYLCHOLINE CHLORIDE 200 MG/10ML IV SOSY
PREFILLED_SYRINGE | INTRAVENOUS | Status: DC | PRN
  Administered 2018-07-02: 140 mg via INTRAVENOUS

## 2018-07-02 MED ORDER — IOPAMIDOL (ISOVUE-300) INJECTION 61%
100.0000 mL | Freq: Once | INTRAVENOUS | Status: AC | PRN
  Administered 2018-07-02: 100 mL via INTRAVENOUS

## 2018-07-02 MED ORDER — ROCURONIUM BROMIDE 10 MG/ML (PF) SYRINGE
PREFILLED_SYRINGE | INTRAVENOUS | Status: AC
  Filled 2018-07-02: qty 10

## 2018-07-02 MED ORDER — ONDANSETRON HCL 4 MG/2ML IJ SOLN
INTRAMUSCULAR | Status: DC | PRN
  Administered 2018-07-02: 4 mg via INTRAVENOUS

## 2018-07-02 MED ORDER — ONDANSETRON HCL 4 MG PO TABS: 4.0000 mg | ORAL_TABLET | Freq: Four times a day (QID) | ORAL | Status: DC | PRN

## 2018-07-02 MED ORDER — LATANOPROST 0.005 % OP SOLN
1.0000 [drp] | Freq: Every day | OPHTHALMIC | Status: DC
  Administered 2018-07-03: 1 [drp] via OPHTHALMIC
  Filled 2018-07-02: qty 2.5

## 2018-07-02 MED ORDER — LACTATED RINGERS IV SOLN
INTRAVENOUS | Status: DC | PRN
  Administered 2018-07-02: 23:00:00 via INTRAVENOUS

## 2018-07-02 MED ORDER — MORPHINE SULFATE (PF) 2 MG/ML IV SOLN
2.0000 mg | Freq: Once | INTRAVENOUS | Status: DC
  Filled 2018-07-02: qty 1

## 2018-07-02 MED ORDER — ONDANSETRON HCL 4 MG/2ML IJ SOLN: 4.0000 mg | Freq: Four times a day (QID) | INTRAMUSCULAR | Status: DC | PRN

## 2018-07-02 MED ORDER — ETOMIDATE 2 MG/ML IV SOLN
INTRAVENOUS | Status: DC | PRN
  Administered 2018-07-02: 14 mg via INTRAVENOUS

## 2018-07-02 MED ORDER — ACETAMINOPHEN 325 MG PO TABS: 650.0000 mg | ORAL_TABLET | Freq: Four times a day (QID) | ORAL | Status: DC | PRN

## 2018-07-02 MED ORDER — BUPIVACAINE-EPINEPHRINE (PF) 0.25% -1:200000 IJ SOLN
INTRAMUSCULAR | Status: AC
  Filled 2018-07-02: qty 30

## 2018-07-02 MED ORDER — ACETAMINOPHEN 650 MG RE SUPP: 650.0000 mg | Freq: Four times a day (QID) | RECTAL | Status: DC | PRN

## 2018-07-02 MED ORDER — LIDOCAINE 2% (20 MG/ML) 5 ML SYRINGE
INTRAMUSCULAR | Status: DC | PRN
  Administered 2018-07-02: 50 mg via INTRAVENOUS
  Administered 2018-07-02: 25 mg via INTRAVENOUS

## 2018-07-02 MED ORDER — MIDAZOLAM HCL 2 MG/2ML IJ SOLN
INTRAMUSCULAR | Status: AC
  Filled 2018-07-02: qty 2

## 2018-07-02 MED ORDER — PROPOFOL 10 MG/ML IV BOLUS
INTRAVENOUS | Status: AC
  Filled 2018-07-02: qty 20

## 2018-07-02 MED ORDER — DEXAMETHASONE SODIUM PHOSPHATE 10 MG/ML IJ SOLN
INTRAMUSCULAR | Status: DC | PRN
  Administered 2018-07-02: 10 mg via INTRAVENOUS

## 2018-07-02 MED ORDER — IOPAMIDOL (ISOVUE-300) INJECTION 61%
INTRAVENOUS | Status: AC
  Filled 2018-07-02: qty 100

## 2018-07-02 SURGICAL SUPPLY — 38 items
APL SWBSTK 6 STRL LF DISP (MISCELLANEOUS)
APPLICATOR COTTON TIP 6 STRL (MISCELLANEOUS) ×1 IMPLANT
APPLICATOR COTTON TIP 6IN STRL (MISCELLANEOUS)
BLADE EXTENDED COATED 6.5IN (ELECTRODE) ×1 IMPLANT
BLADE HEX COATED 2.75 (ELECTRODE) ×2 IMPLANT
COVER MAYO STAND STRL (DRAPES) IMPLANT
COVER WAND RF STERILE (DRAPES) IMPLANT
DRAPE LAPAROSCOPIC ABDOMINAL (DRAPES) ×2 IMPLANT
DRAPE WARM FLUID 44X44 (DRAPE) ×1 IMPLANT
DRSG OPSITE POSTOP 4X8 (GAUZE/BANDAGES/DRESSINGS) ×1 IMPLANT
ELECT REM PT RETURN 15FT ADLT (MISCELLANEOUS) ×2 IMPLANT
GAUZE SPONGE 4X4 12PLY STRL (GAUZE/BANDAGES/DRESSINGS) ×1 IMPLANT
GLOVE BIO SURGEON STRL SZ7.5 (GLOVE) ×8 IMPLANT
GLOVE BIOGEL PI IND STRL 7.0 (GLOVE) ×1 IMPLANT
GLOVE BIOGEL PI INDICATOR 7.0 (GLOVE) ×1
GOWN STRL REUS W/ TWL XL LVL3 (GOWN DISPOSABLE) ×1 IMPLANT
GOWN STRL REUS W/TWL LRG LVL3 (GOWN DISPOSABLE) ×4 IMPLANT
GOWN STRL REUS W/TWL XL LVL3 (GOWN DISPOSABLE) ×1 IMPLANT
HANDLE SUCTION POOLE (INSTRUMENTS) IMPLANT
KIT BASIN OR (CUSTOM PROCEDURE TRAY) ×2 IMPLANT
LIGASURE IMPACT 36 18CM CVD LR (INSTRUMENTS) ×1 IMPLANT
NS IRRIG 1000ML POUR BTL (IV SOLUTION) ×3 IMPLANT
PACK GENERAL/GYN (CUSTOM PROCEDURE TRAY) ×2 IMPLANT
SPONGE LAP 18X18 RF (DISPOSABLE) IMPLANT
STAPLER VISISTAT 35W (STAPLE) ×1 IMPLANT
SUCTION POOLE HANDLE (INSTRUMENTS) ×2
SUT NOVA NAB DX-16 0-1 5-0 T12 (SUTURE) ×2 IMPLANT
SUT PDS AB 1 CTX 36 (SUTURE) IMPLANT
SUT SILK 2 0 (SUTURE)
SUT SILK 2 0 SH CR/8 (SUTURE) IMPLANT
SUT SILK 2-0 18XBRD TIE 12 (SUTURE) IMPLANT
SUT SILK 3 0 (SUTURE)
SUT SILK 3 0 SH CR/8 (SUTURE) IMPLANT
SUT SILK 3-0 18XBRD TIE 12 (SUTURE) IMPLANT
TOWEL OR 17X26 10 PK STRL BLUE (TOWEL DISPOSABLE) ×3 IMPLANT
TRAY FOLEY MTR SLVR 16FR STAT (SET/KITS/TRAYS/PACK) ×1 IMPLANT
WATER STERILE IRR 1000ML POUR (IV SOLUTION) ×1 IMPLANT
YANKAUER SUCT BULB TIP NO VENT (SUCTIONS) IMPLANT

## 2018-07-02 NOTE — ED Triage Notes (Signed)
Pt c/o abdominal pain x1 month, flu like sx since Christmas.  Pt reports no able to eat for appx 1 week.  Pt reports seeing PCP today d/t n/v since Saturday, PCP recommended pt come to ED due to incarcerated hernia.  Pt also reports hx of CHF, c/o SHOB.
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
## 2018-07-02 NOTE — ED Notes (Signed)
Gery Pray from OR here to transport pt to OR.

## 2018-07-02 NOTE — Op Note (Signed)
07/02/2018  11:49 PM  PATIENT:  Stanley [name]  73 y.o. male  PRE-OPERATIVE DIAGNOSIS:  incarcerated ventral hernia with small bowel obstruction  POST-OPERATIVE DIAGNOSIS:  incarcerated ventral hernia  PROCEDURE:  Procedure(s): EXPLORATORY LAPAROTOMY, primary repair ventral hernia, partial omentectomy (N/A)  SURGEON:  Surgeon(s) and Role:    Griselda Miner* Toth, Srinivas Lippman III, MD - Primary  PHYSICIAN ASSISTANT:   ASSISTANTS: none   ANESTHESIA:   general  EBL:  25 mL   BLOOD ADMINISTERED:none  DRAINS: none   LOCAL MEDICATIONS USED:  MARCAINE     SPECIMEN:  Source of Specimen:  omentum  DISPOSITION OF SPECIMEN:  PATHOLOGY  COUNTS:  YES  TOURNIQUET:  * No tourniquets in log *  DICTATION: .Dragon Dictation   After informed consent was obtained the patient was brought to the operating room and placed in the supine position on the operating table.  After adequate induction of general anesthesia the patient's abdomen was prepped with ChloraPrep, allowed to dry, and draped in usual sterile manner.  An appropriate timeout was performed.  A small upper midline incision was made overlying the palpable incarcerated hernia with 10 blade knife.  The incision was carried through the skin and subcutaneous tissue sharply with electrocautery until the hernia sac was identified.  During the manipulation of the hernia sac the small intestine reduced spontaneously.  We then open the hernia sac and extended the fascial defect superiorly a very small distance.  The hernia sac had incarcerated omentum in it that had some ischemia.  This portion of omentum was excised sharply with a LigaSure and sent to pathology.  Once this was accomplished we were able to then palpate the entire abdominal wall anteriorly around the fascial opening and there were no further adhesions to the abdominal wall and no further fascial defects that we could appreciate.  The small bowel that I could see through the fascial opening appeared  healthy and viable.  At this point the fascial edges appeared healthy.  The hernia defect was closed with the fascial edges using interrupted #1 Novafil stitches.  The subcutaneous tissue was irrigated with copious amounts of saline.  The subcutaneous tissue was then closed with a running 2-0 Vicryl stitch.  The skin was closed with staples.  Sterile dressings were applied.  The patient tolerated the procedure well.  At the end of the case all needle sponge and instrument counts were correct.  The patient was then awakened and taken to recovery in stable condition.  PLAN OF CARE: Admit to inpatient   PATIENT DISPOSITION:  PACU - hemodynamically stable.   Delay start of Pharmacological VTE agent (>24hrs) due to surgical blood loss or risk of bleeding: no
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
## 2018-07-02 NOTE — ED Notes (Signed)
Bed: IR51 Expected date:  Expected time:  Means of arrival:  Comments: POV hernia

## 2018-07-02 NOTE — Anesthesia Preprocedure Evaluation (Addendum)
Anesthesia Evaluation  Patient identified by MRN, date of birth, ID band Patient awake    Reviewed: Allergy & Precautions, NPO status , Patient's Chart, lab work & pertinent test results  Airway Mallampati: III  TM Distance: >3 FB Neck ROM: Full    Dental no notable dental hx.    Pulmonary shortness of breath and at rest,    Pulmonary exam normal breath sounds clear to auscultation + decreased breath sounds      Cardiovascular hypertension, + CAD, + Cardiac Stents, + CABG and +CHF  Normal cardiovascular exam Rhythm:Regular Rate:Normal  1. No reversible ischemia or infarction.  2. Global hypokinesis.  3. Left ventricular ejection fraction 25% 12/2017   Neuro/Psych negative neurological ROS  negative psych ROS   GI/Hepatic negative GI ROS, Neg liver ROS,   Endo/Other  negative endocrine ROS  Renal/GU Renal InsufficiencyRenal disease  negative genitourinary   Musculoskeletal negative musculoskeletal ROS (+)   Abdominal   Peds negative pediatric ROS (+)  Hematology negative hematology ROS (+)   Anesthesia Other Findings   Reproductive/Obstetrics negative OB ROS                            Anesthesia Physical Anesthesia Plan  ASA: IV and emergent  Anesthesia Plan: General   Post-op Pain Management:    Induction: Intravenous, Rapid sequence and Cricoid pressure planned  PONV Risk Score and Plan: 2 and Ondansetron, Dexamethasone and Treatment may vary due to age or medical condition  Airway Management Planned: Oral ETT  Additional Equipment:   Intra-op Plan:   Post-operative Plan: Possible Post-op intubation/ventilation  Informed Consent: I have reviewed the patients History and Physical, chart, labs and discussed the procedure including the risks, benefits and alternatives for the proposed anesthesia with the patient or authorized representative who has indicated his/her  understanding and acceptance.   Dental advisory given  Plan Discussed with: CRNA and Surgeon  Anesthesia Plan Comments:        Anesthesia Quick Evaluation
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
## 2018-07-03 ENCOUNTER — Other Ambulatory Visit: Payer: Self-pay

## 2018-07-03 ENCOUNTER — Encounter (HOSPITAL_COMMUNITY): Payer: Self-pay

## 2018-07-03 DIAGNOSIS — K436 Other and unspecified ventral hernia with obstruction, without gangrene: Secondary | ICD-10-CM | POA: Diagnosis present

## 2018-07-03 LAB — CBC
HCT: 50.2 % (ref 39.0–52.0)
Hemoglobin: 16.6 g/dL (ref 13.0–17.0)
MCH: 33.1 pg (ref 26.0–34.0)
MCHC: 33.1 g/dL (ref 30.0–36.0)
MCV: 100.2 fL — ABNORMAL HIGH (ref 80.0–100.0)
PLATELETS: 255 10*3/uL (ref 150–400)
RBC: 5.01 MIL/uL (ref 4.22–5.81)
RDW: 12.7 % (ref 11.5–15.5)
WBC: 6.7 10*3/uL (ref 4.0–10.5)
nRBC: 0 % (ref 0.0–0.2)

## 2018-07-03 LAB — HEMOGLOBIN A1C
Hgb A1c MFr Bld: 5.7 % — ABNORMAL HIGH (ref 4.8–5.6)
Mean Plasma Glucose: 116.89 mg/dL

## 2018-07-03 LAB — BASIC METABOLIC PANEL
Anion gap: 10 (ref 5–15)
BUN: 25 mg/dL — ABNORMAL HIGH (ref 8–23)
CO2: 27 mmol/L (ref 22–32)
Calcium: 9.6 mg/dL (ref 8.9–10.3)
Chloride: 103 mmol/L (ref 98–111)
Creatinine, Ser: 1.2 mg/dL (ref 0.61–1.24)
GFR calc non Af Amer: >60 mL/min (ref >60)
Glucose, Bld: 297 mg/dL — ABNORMAL HIGH (ref 70–99)
Potassium: 5.7 mmol/L — ABNORMAL HIGH (ref 3.5–5.1)
SODIUM: 140 mmol/L (ref 135–145)

## 2018-07-03 LAB — GLUCOSE, CAPILLARY
Glucose-Capillary: 152 mg/dL — ABNORMAL HIGH (ref 70–99)
Glucose-Capillary: 112 mg/dL — ABNORMAL HIGH (ref 70–99)
Glucose-Capillary: 112 mg/dL — ABNORMAL HIGH (ref 70–99)

## 2018-07-03 MED ORDER — ONDANSETRON HCL 4 MG/2ML IJ SOLN: 4.0000 mg | Freq: Four times a day (QID) | INTRAMUSCULAR | Status: DC | PRN

## 2018-07-03 MED ORDER — INSULIN ASPART 100 UNIT/ML ~~LOC~~ SOLN
0.0000 [IU] | Freq: Three times a day (TID) | SUBCUTANEOUS | Status: DC
  Administered 2018-07-03: 2 [IU] via SUBCUTANEOUS
  Administered 2018-07-04: 1 [IU] via SUBCUTANEOUS

## 2018-07-03 MED ORDER — MIDAZOLAM HCL 5 MG/5ML IJ SOLN
INTRAMUSCULAR | Status: DC | PRN
  Administered 2018-07-02: 0.5 mg via INTRAVENOUS

## 2018-07-03 MED ORDER — KCL IN DEXTROSE-NACL 20-5-0.9 MEQ/L-%-% IV SOLN
INTRAVENOUS | Status: DC
  Administered 2018-07-03: 02:00:00 via INTRAVENOUS
  Filled 2018-07-03: qty 1000

## 2018-07-03 MED ORDER — HYDROMORPHONE HCL 1 MG/ML IJ SOLN: 0.2500 mg | INTRAMUSCULAR | Status: DC | PRN

## 2018-07-03 MED ORDER — MENTHOL 3 MG MT LOZG: 1.0000 | LOZENGE | OROMUCOSAL | Status: DC | PRN

## 2018-07-03 MED ORDER — ACETAMINOPHEN 10 MG/ML IV SOLN
1000.0000 mg | Freq: Four times a day (QID) | INTRAVENOUS | Status: AC
  Administered 2018-07-03 – 2018-07-04 (×4): 1000 mg via INTRAVENOUS
  Filled 2018-07-03 (×4): qty 100

## 2018-07-03 MED ORDER — PANTOPRAZOLE SODIUM 40 MG IV SOLR
40.0000 mg | Freq: Every day | INTRAVENOUS | Status: DC
  Administered 2018-07-03 (×2): 40 mg via INTRAVENOUS
  Filled 2018-07-03 (×2): qty 40

## 2018-07-03 MED ORDER — PHENOL 1.4 % MT LIQD
1.0000 | OROMUCOSAL | Status: DC | PRN
  Filled 2018-07-03: qty 177

## 2018-07-03 MED ORDER — ONDANSETRON 4 MG PO TBDP: 4.0000 mg | ORAL_TABLET | Freq: Four times a day (QID) | ORAL | Status: DC | PRN

## 2018-07-03 MED ORDER — INSULIN ASPART 100 UNIT/ML ~~LOC~~ SOLN: 0.0000 [IU] | Freq: Every day | SUBCUTANEOUS | Status: DC

## 2018-07-03 MED ORDER — MORPHINE SULFATE (PF) 2 MG/ML IV SOLN: 1.0000 mg | INTRAVENOUS | Status: DC | PRN

## 2018-07-03 MED ORDER — ASPIRIN EC 81 MG PO TBEC
81.0000 mg | DELAYED_RELEASE_TABLET | Freq: Every day | ORAL | Status: DC
  Administered 2018-07-03: 81 mg via ORAL
  Filled 2018-07-03 (×3): qty 1

## 2018-07-03 MED ORDER — ETOMIDATE 2 MG/ML IV SOLN
INTRAVENOUS | Status: AC
  Filled 2018-07-03: qty 10

## 2018-07-03 MED ORDER — PROMETHAZINE HCL 25 MG/ML IJ SOLN: 6.2500 mg | INTRAMUSCULAR | Status: DC | PRN

## 2018-07-03 MED ORDER — HEPARIN SODIUM (PORCINE) 5000 UNIT/ML IJ SOLN
5000.0000 [IU] | Freq: Three times a day (TID) | INTRAMUSCULAR | Status: DC
  Administered 2018-07-03 – 2018-07-04 (×2): 5000 [IU] via SUBCUTANEOUS
  Filled 2018-07-03 (×2): qty 1

## 2018-07-03 MED ORDER — PIPERACILLIN-TAZOBACTAM 3.375 G IVPB 30 MIN
3.3750 g | Freq: Once | INTRAVENOUS | Status: AC
  Administered 2018-07-02: 3.375 g via INTRAVENOUS

## 2018-07-03 NOTE — Anesthesia Procedure Notes (Signed)
Procedure Name: Intubation Date/Time: 07/02/2018 11:02 PM Performed by: Illene Silver, CRNA Pre-anesthesia Checklist: Patient identified, Emergency Drugs available, Suction available and Patient being monitored Patient Re-evaluated:Patient Re-evaluated prior to induction Oxygen Delivery Method: Circle system utilized Preoxygenation: Pre-oxygenation with 100% oxygen Induction Type: IV induction, Cricoid Pressure applied and Rapid sequence Laryngoscope Size: Glidescope and 4 Grade View: Grade II Tube type: Subglottic suction tube Tube size: 7.5 mm Number of attempts: 1 Airway Equipment and Method: Stylet and Oral airway Placement Confirmation: ETT inserted through vocal cords under direct vision,  positive ETCO2 and breath sounds checked- equal and bilateral Secured at: 23 cm Tube secured with: Tape Dental Injury: Teeth and Oropharynx as per pre-operative assessment  Difficulty Due To: Difficulty was anticipated, Difficult Airway- due to anterior larynx and Difficult Airway- due to dentition Comments: Elective glidescope 2ndary to  Mandibular surgery and neck deviation.

## 2018-07-03 NOTE — Transfer of Care (Signed)
Immediate Anesthesia Transfer of Care Note  Patient: Stanley [name]  Procedure(s) Performed: EXPLORATORY LAPAROTOMY primary repair ventral hernia partial omentectomy (N/A Abdomen)  Patient Location: PACU  Anesthesia Type:General  Level of Consciousness: awake, alert , oriented and patient cooperative  Airway & Oxygen Therapy: Patient Spontanous Breathing and Patient connected to face mask oxygen  Post-op Assessment: Report given to RN, Post -op Vital signs reviewed and stable and Patient moving all extremities X 4  Post vital signs: stable  Last Vitals:  Vitals Value Taken Time  BP    Temp    Pulse    Resp    SpO2      Last Pain:  Vitals:   07/02/18 2038  TempSrc:   PainSc: 4          Complications: No apparent anesthesia complications
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
## 2018-07-03 NOTE — Progress Notes (Signed)
Initial Nutrition Assessment  DOCUMENTATION CODES:   Non-severe (moderate) malnutrition in context of acute illness/injury  INTERVENTION:    Monitor for diet advancement/toleration  Ensure Enlive po BID, each supplement provides 350 kcal and 20 grams of protein once diet advanced   NUTRITION DIAGNOSIS:   Moderate Malnutrition related to acute illness(incarcerated hernia/SBO) as evidenced by energy intake < 75% for > or equal to 1 month, mild fat depletion, mild muscle depletion, moderate muscle depletion.  GOAL:   Patient will meet greater than or equal to 90% of their needs  MONITOR:   Diet advancement, Weight trends, Labs, I & O's  REASON FOR ASSESSMENT:   Malnutrition Screening Tool    ASSESSMENT:   Patient with PMH significant for gout, CAD s/p PCI to RCA, PAD, CHF, CKD III, and essential HTN. Presents this admission with incarcerated ventral hernia with SBO.    1/6- ex lap, primary repair ventral hernia, partial omentectomy   Pt endorses having a loss in appetite for 3-4 weeks PTA due to feelings of fullness and nausea. During this time period he could only tolerate 5-6 bites at each meal. States he typically follows a low sodium diet and consumes three meals daily (consists of: fruit, oatmeal, chicken, salads, and mixed vegetables). Pt does not use supplementation at home but tried Ensure the other day and liked it. Pt is currently NPO with NGT to suction. Discussed diet progression once POs are allowed. Will provide supplements once advanced.   Pt reports a UBW of 170 lb and a recent 5 lb wt loss. Records indicate pt weighed 182 lb on 04/25/18 and 170 lb this admission. Suspect pt has lost dry wt but hard to quantify given CHF history.   NGT: 200 ml x 24 hrs   Medications reviewed and include: SSI Labs reviewed: K 5.7 (H) Glucose 297 (H)   NUTRITION - FOCUSED PHYSICAL EXAM:    Most Recent Value  Orbital Region  Mild depletion  Upper Arm Region  Moderate  depletion  Thoracic and Lumbar Region  Unable to assess  Buccal Region  Mild depletion  Temple Region  Moderate depletion  Clavicle Bone Region  Moderate depletion  Clavicle and Acromion Bone Region  Mild depletion  Scapular Bone Region  Unable to assess  Dorsal Hand  No depletion  Patellar Region  Mild depletion  Anterior Thigh Region  Mild depletion  Posterior Calf Region  No depletion  Edema (RD Assessment)  None     Diet Order:   Diet Order            Diet NPO time specified Except for: [employer], Sips with Meds  Diet effective now              EDUCATION NEEDS:   Education needs have been addressed  Skin:  Skin Assessment: Skin Integrity Issues: Skin Integrity Issues:: Incisions Incisions: closed abdomen   Last BM:  06/30/17  Height:   Ht Readings from Last 1 Encounters:  07/02/18 5' 6.5" (1.689 m)    Weight:   Wt Readings from Last 1 Encounters:  07/02/18 77.1 kg    Ideal Body Weight:  67.3 kg  BMI:  Body mass index is 27.01 kg/m.  Estimated Nutritional Needs:   Kcal:  1800-2000 kcal  Protein:  90-105 grams  Fluid:  >/= 1.8 L/day   Vanessa Kick RD, LDN Clinical Nutrition Pager # - [phone]
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
## 2018-07-04 DIAGNOSIS — E440 Moderate protein-calorie malnutrition: Secondary | ICD-10-CM

## 2018-07-04 LAB — BASIC METABOLIC PANEL
Anion gap: 9 (ref 5–15)
BUN: 28 mg/dL — ABNORMAL HIGH (ref 8–23)
CO2: 28 mmol/L (ref 22–32)
Calcium: 9.7 mg/dL (ref 8.9–10.3)
Chloride: 101 mmol/L (ref 98–111)
Creatinine, Ser: 1.4 mg/dL — ABNORMAL HIGH (ref 0.61–1.24)
GFR calc Af Amer: 58 mL/min — ABNORMAL LOW (ref >60)
GFR calc non Af Amer: 50 mL/min — ABNORMAL LOW (ref >60)
Glucose, Bld: 106 mg/dL — ABNORMAL HIGH (ref 70–99)
Potassium: 3.5 mmol/L (ref 3.5–5.1)
Sodium: 138 mmol/L (ref 135–145)

## 2018-07-04 LAB — CBC
HCT: 43.2 % (ref 39.0–52.0)
Hemoglobin: 14.1 g/dL (ref 13.0–17.0)
MCH: 32.7 pg (ref 26.0–34.0)
MCHC: 32.6 g/dL (ref 30.0–36.0)
MCV: 100.2 fL — ABNORMAL HIGH (ref 80.0–100.0)
Platelets: 192 10*3/uL (ref 150–400)
RBC: 4.31 MIL/uL (ref 4.22–5.81)
RDW: 12.5 % (ref 11.5–15.5)
WBC: 6.7 10*3/uL (ref 4.0–10.5)
nRBC: 0 % (ref 0.0–0.2)

## 2018-07-04 LAB — MAGNESIUM: Magnesium: 1.6 mg/dL — ABNORMAL LOW (ref 1.7–2.4)

## 2018-07-04 LAB — GLUCOSE, CAPILLARY
Glucose-Capillary: 91 mg/dL (ref 70–99)
Glucose-Capillary: 129 mg/dL — ABNORMAL HIGH (ref 70–99)

## 2018-07-04 MED ORDER — OXYCODONE HCL 5 MG PO TABS: 5.0000 mg | ORAL_TABLET | Freq: Three times a day (TID) | ORAL | 0 refills | Status: DC | PRN

## 2018-07-04 MED ORDER — POTASSIUM CHLORIDE CRYS ER 20 MEQ PO TBCR
40.0000 meq | EXTENDED_RELEASE_TABLET | Freq: Once | ORAL | Status: AC
  Administered 2018-07-04: 40 meq via ORAL
  Filled 2018-07-04: qty 2

## 2018-07-04 MED ORDER — ASPIRIN EC 81 MG PO TBEC: 81.0000 mg | DELAYED_RELEASE_TABLET | Freq: Every day | ORAL | Status: DC

## 2018-07-04 MED ORDER — ENSURE ENLIVE PO LIQD
237.0000 mL | Freq: Two times a day (BID) | ORAL | Status: DC
  Administered 2018-07-04 (×2): 237 mL via ORAL

## 2018-07-04 MED ORDER — MAGNESIUM SULFATE 4 GM/100ML IV SOLN
4.0000 g | Freq: Once | INTRAVENOUS | Status: AC
  Administered 2018-07-04: 4 g via INTRAVENOUS
  Filled 2018-07-04: qty 100

## 2018-07-04 NOTE — Discharge Summary (Signed)
Patient ID: Stanley [name] [id number] 02-Aug-1945 73 y.o.  Admit date: 07/02/2018 Discharge date: 07/04/2018  Admitting Diagnosis: Incarcerated ventral hernia with small bowel obstruction  Discharge Diagnosis Patient Active Problem List   Diagnosis Date Noted  . Malnutrition of moderate degree 07/04/2018  . Ventral hernia with bowel obstruction 07/03/2018  . Incarcerated epigastric hernia 07/02/2018  . SBO (small bowel obstruction) (HCC) 07/02/2018  . CKD (chronic kidney disease) stage 3, GFR 30-59 ml/min (HCC) 07/02/2018  . Essential hypertension   . Coronary artery disease due to lipid rich plaque   . Peripheral arterial disease (HCC) 10/26/2017  . Morbid obesity due to excess calories (HCC) 01/25/2017  . Upper airway cough syndrome 01/24/2017  . CAD (coronary artery disease), native coronary artery 03/21/2016  . Chronic combined systolic and diastolic CHF (congestive heart failure) (HCC)   . Dyspnea on exertion   . Thrombocytopenia (HCC)   . Elevated troponin 12/05/2015  . Hyperlipidemia   . Essential hypertension, benign 10/16/2013  . Encounter for long-term (current) use of other medications 10/16/2013  . Nevus, non-neoplastic 07/25/2012  . Varicose veins of lower extremities with other complications 06/25/2012  . Leg pain, bilateral 06/25/2012  . Swelling of limb 03/12/2012  . Cholelithiasis 02/10/2011    Consultants Medicine   Reason for Admission: The patient is a 73 year old black male who for started feeling bad around Thanksgiving.  Since that time he has had progressive worsening of abdominal pain with some episodes of nausea and vomiting.  He finally came to the emergency department today where a CT scan showed a ventral hernia just above the umbilicus involving a small bowel loop causing an obstruction.  He denies any fevers or chills.  He says he felt much worse over the weekend.  Procedures Exploratory Laparotomy, primary repair ventral hernia, partial  omentectomy   Hospital Course:  Patient was admitted and underwent exploratory laparotomy with a partial omentectomy and primary repair of his ventral hernia by Dr. Carolynne Edouard on 1/6.  No mesh was used.  Patient tolerated the procedure well.  On postop day 1, the patient was without nausea, voiding well, mobilizing, passing flatus and pain was controlled.  NG tube was removed and patient was placed on clear liquids.  On postop day 2, patient was advanced to regular diet and tolerated well.  He reports minimal pain controlled with oral medication.  Denies nausea or vomiting.  Continues to pass flatus. He is voiding well and mobilizing appropriatley.  Patient is stable for discharge home at this time with appropriate follow-up made.   Physical Exam: Abd: Soft, non-distended. +BS. Appropriately tender. Honeycomb dressing in place.   Allergies as of 07/04/2018      Reactions   Meloxicam Other (See Comments)   Joint aching    Methocarbamol Other (See Comments)   UNKNOWN TO PATIENT   Prednisone Shortness Of Breath   Statins Other (See Comments), Anaphylaxis   Myalgias, even with Crestor once weekly   Zetia [ezetimibe] Other (See Comments)   Full body myalgias   Losartan    Possible contribution to dry cough   Prabotulinumtoxina       Medication List    TAKE these medications   AMBULATORY NON FORMULARY MEDICATION Inject 140 mg into the skin every 14 (fourteen) days. Medication Name: evolocumab vs placebo, study drug supplied, [facility] study   aspirin 81 MG tablet Take 1 tablet (81 mg total) by mouth daily.   carboxymethylcellulose 0.5 % Soln Commonly known as:  REFRESH  PLUS Place 1 drop into both eyes daily as needed (dry eyes).   CENTRUM CARDIO PO Take 1 tablet by mouth at bedtime.   colchicine 0.6 MG tablet Take 0.6 mg by mouth as needed (gout flare up). GOUT   diclofenac sodium 1 % Gel Commonly known as:  VOLTAREN Apply 4 g topically 4 (four) times daily.   fluticasone 50  MCG/ACT nasal spray Commonly known as:  FLONASE Place 1 spray into both nostrils daily as needed for allergies.   latanoprost 0.005 % ophthalmic solution Commonly known as:  XALATAN Place 1 drop into both eyes at bedtime.   nitroGLYCERIN 0.4 MG/SPRAY spray Commonly known as:  NITROLINGUAL Place 1 spray under the tongue every 5 (five) minutes x 3 doses as needed for chest pain.   omeprazole 20 MG capsule Commonly known as:  PRILOSEC TAKE 1 CAPSULE BY MOUTH EVERY DAY   oxyCODONE 5 MG immediate release tablet Commonly known as:  ROXICODONE Take 1 tablet (5 mg total) by mouth every 8 (eight) hours as needed.   potassium chloride 20 MEQ/15ML (10%) Soln Take 30 mLs (40 mEq total) by mouth daily.   torsemide 20 MG tablet Commonly known as:  DEMADEX Take 2 tablets (40 mg total) by mouth daily.       Patient reviewed in [place] Controlled Substance Reporting System.   Follow-up Information    [facility] up on 07/12/2018.   Specialty:  General Surgery Why:  You have a nurse visit on Thur 07/12/2018 @ 2pm for staple removal. Please arrive at 1:30pm (30 minutes early) to fill out paperwork. You will need to bring a copy of your photo ID and insurance card.  Contact information: [address] [place] [address] [phone]        Chevis Prettyoth, Paul III, MD Follow up on 07/23/2018.   Specialty:  General Surgery Why:  You have an appointment with Dr. Carolynne Edouardoth on 1/27 @ 10:00am. Please arrive at 9:45am.  Contact information: [address] [place] [address] [phone]           Signed: Leary RocaMichael Mendel Binsfeld, [facility] 07/04/2018, 10:24 AM Pager: [phone]

## 2018-07-04 NOTE — Progress Notes (Signed)
Pt given discharge instructions and paperwork. All questioned answered. Belongings gathered and sent with patient. [facility] with NT and family member.

## 2018-07-04 NOTE — Plan of Care (Signed)
  Problem: Education: Goal: Knowledge of General Education information will improve Description Including pain rating scale, medication(s)/side effects and non-pharmacologic comfort measures Outcome: Progressing   

## 2018-07-04 NOTE — Care Management Note (Signed)
Case Management Note  Patient Details  Name: Stanley [name] MRN: [id number] Date of Birth: 11-25-1945  Subjective/Objective:  Incarcerated epigastri  Hernia. From home.  Home rw ordered. [facility] rep Clydie BraunKaren will deliver rw to rm prior d/c.No further CM needs.                  Action/Plan:dc home w/DME.   Expected Discharge Date:  07/04/18               Expected Discharge Plan:  Home/Self Care  In-House Referral:     Discharge planning Services  CM Consult  Post Acute Care Choice:    Choice offered to:     DME Arranged:  [facility] D[facility] Agency:  [facility]  [facility]     Status of Service:  Completed, signed off  If discussed at Long Length of Stay Meetings, dates discussed:    Additional Comments:  Lanier ClamMahabir, Leshonda Galambos, RN 07/04/2018, 12:31 PM

## 2018-07-04 NOTE — Discharge Instructions (Signed)
[facility]      [facility], [place] [phone]  OPEN ABDOMINAL SURGERY: POST OP INSTRUCTIONS  Always review your discharge instruction sheet given to you by the facility where your surgery was performed.  IF YOU HAVE DISABILITY OR FAMILY LEAVE FORMS, YOU MUST BRING THEM TO THE OFFICE FOR PROCESSING.  PLEASE DO NOT GIVE THEM TO YOUR DOCTOR.  1. A prescription for pain medication may be given to you upon discharge.  Take your pain medication as prescribed, if needed.  If narcotic pain medicine is not needed, then you may take acetaminophen (Tylenol) or ibuprofen (Advil) as needed. 2. Take your usually prescribed medications unless otherwise directed. 3. If you need a refill on your pain medication, please contact your pharmacy. They will contact our office to request authorization.  Prescriptions will not be filled after 5pm or on week-ends. 4. You should follow a light diet the first few days after arrival home, such as soup and crackers, pudding, etc.unless your doctor has advised otherwise. A high-fiber, low fat diet can be resumed as tolerated.   Be sure to include lots of fluids daily. Most patients will experience some swelling and bruising on the chest and neck area.  Ice packs will help.  Swelling and bruising can take several days to resolve 5. Most patients will experience some swelling and bruising in the area of the incision. Ice pack will help. Swelling and bruising can take several days to resolve..  6. It is common to experience some constipation if taking pain medication after surgery.  Increasing fluid intake and taking a stool softener will usually help or prevent this problem from occurring.  A mild laxative (Milk of Magnesia or Miralax) should be taken according to package directions if there are no bowel movements after 48 hours. 7.  You may have steri-strips (small skin tapes) in place directly over the incision.  These strips should be left on the skin for 7-10 days.  If your  surgeon used skin glue on the incision, you may shower in 24 hours.  The glue will flake off over the next 2-3 weeks.  Any sutures or staples will be removed at the office during your follow-up visit. You may find that a light gauze bandage over your incision may keep your staples from being rubbed or pulled. You may shower and replace the bandage daily. 8. ACTIVITIES:  You may resume regular (light) daily activities beginning the next day--such as daily self-care, walking, climbing stairs--gradually increasing activities as tolerated.  You may have sexual intercourse when it is comfortable.  Refrain from any heavy lifting or straining until approved by your doctor. a. You may drive when you no longer are taking prescription pain medication, you can comfortably wear a seatbelt, and you can safely maneuver your car and apply brakes 9. You should see your doctor in the office for a follow-up appointment approximately two weeks after your surgery.  Make sure that you call for this appointment within a day or two after you arrive home to insure a convenient appointment time. OTHER INSTRUCTIONS:  You can remove your honeycomb dressing on 07/07/2018 _____________________________________________________________  WHEN TO CALL YOUR DOCTOR: 1. Fever over 101.0 2. Inability to urinate 3. Nausea and/or vomiting 4. Extreme swelling or bruising 5. Continued bleeding from incision. 6. Increased pain, redness, or drainage from the incision. 7. Difficulty swallowing or breathing 8. Muscle cramping or spasms. 9. Numbness or tingling in hands or feet or around lips.  The clinic staff is  available to answer your questions during regular business hours.  Please dont hesitate to call and ask to speak to one of the nurses if you have concerns.  For further questions, please visit [email or web address]

## 2018-07-04 NOTE — Anesthesia Postprocedure Evaluation (Signed)
Anesthesia Post Note  Patient: Stanley [name]  Procedure(s) Performed: EXPLORATORY LAPAROTOMY primary repair ventral hernia partial omentectomy (N/A Abdomen)     Patient location during evaluation: PACU Anesthesia Type: General Level of consciousness: awake and alert Pain management: pain level controlled Vital Signs Assessment: post-procedure vital signs reviewed and stable Respiratory status: spontaneous breathing, nonlabored ventilation, respiratory function stable and patient connected to nasal cannula oxygen Cardiovascular status: blood pressure returned to baseline and stable Postop Assessment: no apparent nausea or vomiting Anesthetic complications: no    Last Vitals:  Vitals:   07/03/18 2139 07/04/18 0624  BP: 94/68 (!) 91/59  Pulse: 83 80  Resp: 18 18  Temp: (!) 36.3 C 36.4 C  SpO2: 93% 96%    Last Pain:  Vitals:   07/04/18 0624  TempSrc: Oral  PainSc:                  [name] S

## 2018-08-02 ENCOUNTER — Other Ambulatory Visit: Payer: Self-pay | Admitting: Gastroenterology

## 2018-08-02 DIAGNOSIS — R7989 Other specified abnormal findings of blood chemistry: Secondary | ICD-10-CM

## 2018-08-02 DIAGNOSIS — R945 Abnormal results of liver function studies: Secondary | ICD-10-CM

## 2018-08-06 ENCOUNTER — Ambulatory Visit
Admission: RE | Admit: 2018-08-06 | Discharge: 2018-08-06 | Disposition: A | Discharge status: Home / Self Care | Payer: Medicare Other | Source: Ambulatory Visit | Attending: Gastroenterology | Admitting: Gastroenterology

## 2018-08-06 DIAGNOSIS — R945 Abnormal results of liver function studies: Secondary | ICD-10-CM

## 2018-08-06 DIAGNOSIS — R7989 Other specified abnormal findings of blood chemistry: Secondary | ICD-10-CM

## 2018-08-15 ENCOUNTER — Encounter: Payer: Medicare Other | Admitting: *Deleted

## 2018-08-15 DIAGNOSIS — Z006 Encounter for examination for normal comparison and control in clinical research program: Secondary | ICD-10-CM

## 2018-08-15 NOTE — Research (Addendum)
Subject Name: [name]  Subject met inclusion and exclusion criteria.  The informed consent form, study requirements and expectations were reviewed with the subject and questions and concerns were addressed prior to the signing of the consent form.  The subject verbalized understanding of the trial requirements.  The subject agreed to participate in the [facility] trial and signed the informed consent at 08/15/18. The informed consent was obtained prior to performance of any protocol-specific procedures for the subject.  A copy of the signed informed consent was given to the subject and a copy was placed in the subject's medical record.   [facility]   [facility] Week 16 Visit  Patient Name Stanley [name] Subject ID# [id number]  Visit Date 08/15/18   Treatment compliance: Date returned: 08/15/18 Number returned from box# [id number]: 3 Number returned from box# [id number]: 4 Number returned-total: 7 Number of Full administrations:7  Number of partial administrations:0 Number of non administrations:0 Number of unknown administrations:0  Reason for any partial or non administrations: Number due to non compliance:0  Number due to device complaint:0 Number due to other:0  IP Dispensation: Date dispensed 08/15/18 Number dispensed- total: 8 Number dispensed home to patient: 7 Box # [id number]: 3 Box # [id number]:5  IP Administration in Clinic: Box # [id number] Administration time 0932 _0 AM _1 PM in clinic Quantity Administered    _2 None _3 Partial _4 Full Reason for None or Partial Dose ____ Administration Site _5 Abdomen _6 Arm _7 Thigh Laterality _8 Left  _9 Right Administered by _10 Patient _11 Caregiver  Subject agrees to have PCP informed of participation in [place] study. [facility] letter sent to Dr Gus Height.

## 2018-08-16 ENCOUNTER — Other Ambulatory Visit: Payer: Self-pay | Admitting: Gastroenterology

## 2018-08-16 DIAGNOSIS — R198 Other specified symptoms and signs involving the digestive system and abdomen: Secondary | ICD-10-CM

## 2018-08-20 ENCOUNTER — Ambulatory Visit
Admission: RE | Admit: 2018-08-20 | Discharge: 2018-08-20 | Disposition: A | Discharge status: Home / Self Care | Payer: Medicare Other | Source: Ambulatory Visit | Attending: Gastroenterology | Admitting: Gastroenterology

## 2018-08-20 DIAGNOSIS — R198 Other specified symptoms and signs involving the digestive system and abdomen: Secondary | ICD-10-CM

## 2018-08-21 ENCOUNTER — Other Ambulatory Visit: Payer: Self-pay | Admitting: Gastroenterology

## 2018-09-07 ENCOUNTER — Inpatient Hospital Stay (HOSPITAL_COMMUNITY)
Admission: EM | Admit: 2018-09-07 | Discharge: 2018-09-17 | DRG: 871 | Disposition: A | Discharge status: Skilled Nursing Facility | Payer: Medicare Other | Attending: Family Medicine | Admitting: Family Medicine

## 2018-09-07 ENCOUNTER — Observation Stay (HOSPITAL_COMMUNITY): Payer: Medicare Other

## 2018-09-07 ENCOUNTER — Emergency Department (HOSPITAL_COMMUNITY): Payer: Medicare Other

## 2018-09-07 ENCOUNTER — Encounter (HOSPITAL_COMMUNITY): Payer: Self-pay | Admitting: *Deleted

## 2018-09-07 ENCOUNTER — Other Ambulatory Visit: Payer: Self-pay

## 2018-09-07 DIAGNOSIS — Z6829 Body mass index (BMI) 29.0-29.9, adult: Secondary | ICD-10-CM

## 2018-09-07 DIAGNOSIS — R069 Unspecified abnormalities of breathing: Secondary | ICD-10-CM

## 2018-09-07 DIAGNOSIS — R601 Generalized edema: Secondary | ICD-10-CM | POA: Diagnosis not present

## 2018-09-07 DIAGNOSIS — R945 Abnormal results of liver function studies: Secondary | ICD-10-CM

## 2018-09-07 DIAGNOSIS — R778 Other specified abnormalities of plasma proteins: Secondary | ICD-10-CM | POA: Diagnosis present

## 2018-09-07 DIAGNOSIS — I2510 Atherosclerotic heart disease of native coronary artery without angina pectoris: Secondary | ICD-10-CM | POA: Diagnosis not present

## 2018-09-07 DIAGNOSIS — R0602 Shortness of breath: Secondary | ICD-10-CM | POA: Diagnosis not present

## 2018-09-07 DIAGNOSIS — J90 Pleural effusion, not elsewhere classified: Secondary | ICD-10-CM

## 2018-09-07 DIAGNOSIS — Z791 Long term (current) use of non-steroidal anti-inflammatories (NSAID): Secondary | ICD-10-CM

## 2018-09-07 DIAGNOSIS — N183 Chronic kidney disease, stage 3 unspecified: Secondary | ICD-10-CM | POA: Diagnosis present

## 2018-09-07 DIAGNOSIS — K760 Fatty (change of) liver, not elsewhere classified: Secondary | ICD-10-CM | POA: Diagnosis present

## 2018-09-07 DIAGNOSIS — I130 Hypertensive heart and chronic kidney disease with heart failure and stage 1 through stage 4 chronic kidney disease, or unspecified chronic kidney disease: Secondary | ICD-10-CM | POA: Diagnosis present

## 2018-09-07 DIAGNOSIS — R0682 Tachypnea, not elsewhere classified: Secondary | ICD-10-CM

## 2018-09-07 DIAGNOSIS — Z9049 Acquired absence of other specified parts of digestive tract: Secondary | ICD-10-CM

## 2018-09-07 DIAGNOSIS — A419 Sepsis, unspecified organism: Secondary | ICD-10-CM | POA: Diagnosis not present

## 2018-09-07 DIAGNOSIS — Z951 Presence of aortocoronary bypass graft: Secondary | ICD-10-CM

## 2018-09-07 DIAGNOSIS — M79604 Pain in right leg: Secondary | ICD-10-CM

## 2018-09-07 DIAGNOSIS — Z7951 Long term (current) use of inhaled steroids: Secondary | ICD-10-CM

## 2018-09-07 DIAGNOSIS — I50813 Acute on chronic right heart failure: Secondary | ICD-10-CM

## 2018-09-07 DIAGNOSIS — Z79899 Other long term (current) drug therapy: Secondary | ICD-10-CM

## 2018-09-07 DIAGNOSIS — H409 Unspecified glaucoma: Secondary | ICD-10-CM | POA: Diagnosis present

## 2018-09-07 DIAGNOSIS — L03116 Cellulitis of left lower limb: Secondary | ICD-10-CM

## 2018-09-07 DIAGNOSIS — E440 Moderate protein-calorie malnutrition: Secondary | ICD-10-CM | POA: Diagnosis present

## 2018-09-07 DIAGNOSIS — E7800 Pure hypercholesterolemia, unspecified: Secondary | ICD-10-CM

## 2018-09-07 DIAGNOSIS — T501X5A Adverse effect of loop [high-ceiling] diuretics, initial encounter: Secondary | ICD-10-CM | POA: Diagnosis present

## 2018-09-07 DIAGNOSIS — Z8249 Family history of ischemic heart disease and other diseases of the circulatory system: Secondary | ICD-10-CM

## 2018-09-07 DIAGNOSIS — D6959 Other secondary thrombocytopenia: Secondary | ICD-10-CM | POA: Diagnosis present

## 2018-09-07 DIAGNOSIS — R531 Weakness: Secondary | ICD-10-CM

## 2018-09-07 DIAGNOSIS — Z955 Presence of coronary angioplasty implant and graft: Secondary | ICD-10-CM

## 2018-09-07 DIAGNOSIS — D696 Thrombocytopenia, unspecified: Secondary | ICD-10-CM

## 2018-09-07 DIAGNOSIS — Z7982 Long term (current) use of aspirin: Secondary | ICD-10-CM

## 2018-09-07 DIAGNOSIS — E872 Acidosis, unspecified: Secondary | ICD-10-CM | POA: Diagnosis present

## 2018-09-07 DIAGNOSIS — R9431 Abnormal electrocardiogram [ECG] [EKG]: Secondary | ICD-10-CM

## 2018-09-07 DIAGNOSIS — I472 Ventricular tachycardia: Secondary | ICD-10-CM | POA: Diagnosis present

## 2018-09-07 DIAGNOSIS — Z9114 Patient's other noncompliance with medication regimen: Secondary | ICD-10-CM

## 2018-09-07 DIAGNOSIS — I509 Heart failure, unspecified: Secondary | ICD-10-CM

## 2018-09-07 DIAGNOSIS — E213 Hyperparathyroidism, unspecified: Secondary | ICD-10-CM | POA: Diagnosis present

## 2018-09-07 DIAGNOSIS — I739 Peripheral vascular disease, unspecified: Secondary | ICD-10-CM | POA: Diagnosis present

## 2018-09-07 DIAGNOSIS — I441 Atrioventricular block, second degree: Secondary | ICD-10-CM | POA: Diagnosis present

## 2018-09-07 DIAGNOSIS — E785 Hyperlipidemia, unspecified: Secondary | ICD-10-CM | POA: Diagnosis present

## 2018-09-07 DIAGNOSIS — K761 Chronic passive congestion of liver: Secondary | ICD-10-CM | POA: Diagnosis present

## 2018-09-07 DIAGNOSIS — I5043 Acute on chronic combined systolic (congestive) and diastolic (congestive) heart failure: Secondary | ICD-10-CM | POA: Diagnosis present

## 2018-09-07 DIAGNOSIS — R570 Cardiogenic shock: Secondary | ICD-10-CM | POA: Diagnosis present

## 2018-09-07 DIAGNOSIS — R6521 Severe sepsis with septic shock: Secondary | ICD-10-CM | POA: Diagnosis present

## 2018-09-07 DIAGNOSIS — I951 Orthostatic hypotension: Secondary | ICD-10-CM | POA: Diagnosis not present

## 2018-09-07 DIAGNOSIS — I10 Essential (primary) hypertension: Secondary | ICD-10-CM | POA: Diagnosis present

## 2018-09-07 DIAGNOSIS — N179 Acute kidney failure, unspecified: Secondary | ICD-10-CM | POA: Diagnosis present

## 2018-09-07 DIAGNOSIS — R7303 Prediabetes: Secondary | ICD-10-CM | POA: Diagnosis present

## 2018-09-07 DIAGNOSIS — R7989 Other specified abnormal findings of blood chemistry: Secondary | ICD-10-CM | POA: Diagnosis present

## 2018-09-07 DIAGNOSIS — L0390 Cellulitis, unspecified: Secondary | ICD-10-CM | POA: Diagnosis present

## 2018-09-07 DIAGNOSIS — M79605 Pain in left leg: Secondary | ICD-10-CM

## 2018-09-07 DIAGNOSIS — L03115 Cellulitis of right lower limb: Secondary | ICD-10-CM | POA: Diagnosis present

## 2018-09-07 DIAGNOSIS — E876 Hypokalemia: Secondary | ICD-10-CM | POA: Diagnosis present

## 2018-09-07 LAB — CBC WITH DIFFERENTIAL/PLATELET
Abs Immature Granulocytes: 0.96 K/uL — ABNORMAL HIGH (ref 0.00–0.07)
Basophils Absolute: 0 K/uL (ref 0.0–0.1)
Basophils Relative: 0 %
Eosinophils Absolute: 0.1 K/uL (ref 0.0–0.5)
Eosinophils Relative: 1 %
HCT: 56.9 % — ABNORMAL HIGH (ref 39.0–52.0)
Hemoglobin: 17.8 g/dL — ABNORMAL HIGH (ref 13.0–17.0)
Immature Granulocytes: 8 %
Lymphocytes Relative: 2 %
Lymphs Abs: 0.2 K/uL — ABNORMAL LOW (ref 0.7–4.0)
MCH: 33.1 pg (ref 26.0–34.0)
MCHC: 31.3 g/dL (ref 30.0–36.0)
MCV: 105.8 fL — ABNORMAL HIGH (ref 80.0–100.0)
Monocytes Absolute: 1.1 K/uL — ABNORMAL HIGH (ref 0.1–1.0)
Monocytes Relative: 9 %
Neutro Abs: 9.8 K/uL — ABNORMAL HIGH (ref 1.7–7.7)
Neutrophils Relative %: 80 %
Platelets: 147 K/uL — ABNORMAL LOW (ref 150–400)
RBC: 5.38 MIL/uL (ref 4.22–5.81)
RDW: 14.5 % (ref 11.5–15.5)
WBC: 12.2 K/uL — ABNORMAL HIGH (ref 4.0–10.5)
nRBC: 0 % (ref 0.0–0.2)

## 2018-09-07 LAB — COMPREHENSIVE METABOLIC PANEL
ALT: 63 U/L — ABNORMAL HIGH (ref 0–44)
AST: 97 U/L — ABNORMAL HIGH (ref 15–41)
Albumin: 3.5 g/dL (ref 3.5–5.0)
Alkaline Phosphatase: 161 U/L — ABNORMAL HIGH (ref 38–126)
Anion gap: 13 (ref 5–15)
BUN: 35 mg/dL — ABNORMAL HIGH (ref 8–23)
CO2: 22 mmol/L (ref 22–32)
Calcium: 10.5 mg/dL — ABNORMAL HIGH (ref 8.9–10.3)
Chloride: 103 mmol/L (ref 98–111)
Creatinine, Ser: 1.41 mg/dL — ABNORMAL HIGH (ref 0.61–1.24)
GFR calc Af Amer: 57 mL/min — ABNORMAL LOW (ref >60)
GFR calc non Af Amer: 49 mL/min — ABNORMAL LOW (ref >60)
Glucose, Bld: 134 mg/dL — ABNORMAL HIGH (ref 70–99)
Potassium: 4.6 mmol/L (ref 3.5–5.1)
Sodium: 138 mmol/L (ref 135–145)
Total Bilirubin: 8 mg/dL — ABNORMAL HIGH (ref 0.3–1.2)
Total Protein: 7.9 g/dL (ref 6.5–8.1)

## 2018-09-07 LAB — URINALYSIS, ROUTINE W REFLEX MICROSCOPIC
Bacteria, UA: NONE SEEN
Bilirubin Urine: NEGATIVE
Glucose, UA: NEGATIVE mg/dL
Ketones, ur: NEGATIVE mg/dL
Leukocytes,Ua: NEGATIVE
Nitrite: NEGATIVE
Protein, ur: NEGATIVE mg/dL
RBC / HPF: >50 RBC/hpf — ABNORMAL HIGH (ref 0–5)
Specific Gravity, Urine: 1.016 (ref 1.005–1.030)
pH: 5 (ref 5.0–8.0)

## 2018-09-07 LAB — TROPONIN I: Troponin I: 0.11 ng/mL (ref <0.03)

## 2018-09-07 LAB — PROCALCITONIN: PROCALCITONIN: 0.84 ng/mL

## 2018-09-07 LAB — BRAIN NATRIURETIC PEPTIDE: B Natriuretic Peptide: 1691.4 pg/mL — ABNORMAL HIGH (ref 0.0–100.0)

## 2018-09-07 LAB — LACTIC ACID, PLASMA
Lactic Acid, Venous: 3.9 mmol/L (ref 0.5–1.9)
Lactic Acid, Venous: 3.7 mmol/L (ref 0.5–1.9)
Lactic Acid, Venous: 4.9 mmol/L (ref 0.5–1.9)

## 2018-09-07 LAB — BILIRUBIN, DIRECT: Bilirubin, Direct: 1.7 mg/dL — ABNORMAL HIGH (ref 0.0–0.2)

## 2018-09-07 LAB — I-STAT TROPONIN, ED: TROPONIN I, POC: 0.28 ng/mL — AB (ref 0.00–0.08)

## 2018-09-07 LAB — APTT: aPTT: 32 seconds (ref 24–36)

## 2018-09-07 LAB — MRSA PCR SCREENING: MRSA by PCR: NEGATIVE

## 2018-09-07 LAB — PROTIME-INR
INR: 1.7 — ABNORMAL HIGH (ref 0.8–1.2)
Prothrombin Time: 19.3 seconds — ABNORMAL HIGH (ref 11.4–15.2)

## 2018-09-07 LAB — CK: Total CK: 84 U/L (ref 49–397)

## 2018-09-07 LAB — LACTATE DEHYDROGENASE: LDH: 248 U/L — ABNORMAL HIGH (ref 98–192)

## 2018-09-07 MED ORDER — FUROSEMIDE 10 MG/ML IJ SOLN
40.0000 mg | Freq: Every day | INTRAMUSCULAR | Status: DC
  Administered 2018-09-09 – 2018-09-10 (×2): 40 mg via INTRAVENOUS
  Filled 2018-09-07 (×2): qty 4

## 2018-09-07 MED ORDER — ALBUTEROL SULFATE (2.5 MG/3ML) 0.083% IN NEBU
5.0000 mg | INHALATION_SOLUTION | Freq: Once | RESPIRATORY_TRACT | Status: AC
  Administered 2018-09-07: 5 mg via RESPIRATORY_TRACT
  Filled 2018-09-07: qty 6

## 2018-09-07 MED ORDER — ACETAMINOPHEN 650 MG RE SUPP: 650.0000 mg | Freq: Four times a day (QID) | RECTAL | Status: DC | PRN

## 2018-09-07 MED ORDER — ENOXAPARIN SODIUM 40 MG/0.4ML ~~LOC~~ SOLN
40.0000 mg | SUBCUTANEOUS | Status: DC
  Administered 2018-09-07 – 2018-09-16 (×10): 40 mg via SUBCUTANEOUS
  Filled 2018-09-07 (×9): qty 0.4

## 2018-09-07 MED ORDER — LATANOPROST 0.005 % OP SOLN
1.0000 [drp] | Freq: Every day | OPHTHALMIC | Status: DC
  Administered 2018-09-08 – 2018-09-16 (×10): 1 [drp] via OPHTHALMIC
  Filled 2018-09-07: qty 2.5

## 2018-09-07 MED ORDER — ASPIRIN EC 81 MG PO TBEC
81.0000 mg | DELAYED_RELEASE_TABLET | Freq: Every day | ORAL | Status: DC
  Administered 2018-09-07 – 2018-09-17 (×11): 81 mg via ORAL
  Filled 2018-09-07 (×11): qty 1

## 2018-09-07 MED ORDER — ENSURE ENLIVE PO LIQD
237.0000 mL | Freq: Two times a day (BID) | ORAL | Status: DC
  Administered 2018-09-08 – 2018-09-17 (×13): 237 mL via ORAL

## 2018-09-07 MED ORDER — ACETAMINOPHEN 325 MG PO TABS: 650.0000 mg | ORAL_TABLET | Freq: Four times a day (QID) | ORAL | Status: DC | PRN

## 2018-09-07 MED ORDER — COLCHICINE 0.6 MG PO TABS
0.6000 mg | ORAL_TABLET | ORAL | Status: DC | PRN
  Filled 2018-09-07: qty 1

## 2018-09-07 MED ORDER — SODIUM CHLORIDE 0.9 % IV SOLN: 250.0000 mL | INTRAVENOUS | Status: DC | PRN

## 2018-09-07 MED ORDER — LEVALBUTEROL HCL 0.63 MG/3ML IN NEBU
0.6300 mg | INHALATION_SOLUTION | RESPIRATORY_TRACT | Status: DC | PRN
  Administered 2018-09-08: 0.63 mg via RESPIRATORY_TRACT
  Filled 2018-09-07: qty 3

## 2018-09-07 MED ORDER — SODIUM CHLORIDE 0.9% FLUSH: 3.0000 mL | INTRAVENOUS | Status: DC | PRN

## 2018-09-07 MED ORDER — SODIUM CHLORIDE 0.9 % IV BOLUS
250.0000 mL | Freq: Once | INTRAVENOUS | Status: AC
  Administered 2018-09-07: 250 mL via INTRAVENOUS

## 2018-09-07 MED ORDER — CLINDAMYCIN PHOSPHATE 600 MG/50ML IV SOLN
600.0000 mg | Freq: Once | INTRAVENOUS | Status: AC
  Administered 2018-09-07: 600 mg via INTRAVENOUS
  Filled 2018-09-07: qty 50

## 2018-09-07 MED ORDER — FUROSEMIDE 10 MG/ML IJ SOLN
40.0000 mg | Freq: Once | INTRAMUSCULAR | Status: AC
  Administered 2018-09-07: 40 mg via INTRAVENOUS
  Filled 2018-09-07: qty 4

## 2018-09-07 MED ORDER — OXYCODONE HCL 5 MG PO TABS: 5.0000 mg | ORAL_TABLET | Freq: Three times a day (TID) | ORAL | Status: DC | PRN

## 2018-09-07 MED ORDER — SODIUM CHLORIDE 0.9 % IV SOLN
2.0000 g | INTRAVENOUS | Status: DC
  Administered 2018-09-07 – 2018-09-12 (×6): 2 g via INTRAVENOUS
  Filled 2018-09-07: qty 2
  Filled 2018-09-07 (×2): qty 20
  Filled 2018-09-07 (×3): qty 2
  Filled 2018-09-07 (×2): qty 20
  Filled 2018-09-07: qty 2
  Filled 2018-09-07: qty 20

## 2018-09-07 MED ORDER — SODIUM CHLORIDE 0.9% FLUSH
3.0000 mL | Freq: Two times a day (BID) | INTRAVENOUS | Status: DC
  Administered 2018-09-07 – 2018-09-10 (×4): 3 mL via INTRAVENOUS
  Administered 2018-09-10: 09:00:00 via INTRAVENOUS
  Administered 2018-09-11 – 2018-09-17 (×12): 3 mL via INTRAVENOUS

## 2018-09-07 MED ORDER — SODIUM CHLORIDE 0.9 % IV BOLUS
500.0000 mL | Freq: Once | INTRAVENOUS | Status: AC
  Administered 2018-09-07: 500 mL via INTRAVENOUS

## 2018-09-07 MED ORDER — GUAIFENESIN ER 600 MG PO TB12
600.0000 mg | ORAL_TABLET | Freq: Two times a day (BID) | ORAL | Status: DC
  Administered 2018-09-07 – 2018-09-17 (×20): 600 mg via ORAL
  Filled 2018-09-07 (×20): qty 1

## 2018-09-07 MED ORDER — PANTOPRAZOLE SODIUM 40 MG PO TBEC
80.0000 mg | DELAYED_RELEASE_TABLET | Freq: Every day | ORAL | Status: DC
  Administered 2018-09-07 – 2018-09-17 (×11): 80 mg via ORAL
  Filled 2018-09-07 (×11): qty 2

## 2018-09-07 NOTE — ED Notes (Signed)
Report to unit attempted at this time.  Unit to callback
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
## 2018-09-07 NOTE — ED Triage Notes (Addendum)
Pt reports SOB, bila leg swelling with weeping, and LLE pain.  She has been seen at her PCP for his BLE swelling and weeping.  Pt's breathing is labored.  Pt's family reports the office was not able to check pt's sats and pulse.  He was also hypotensive in the office.
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
## 2018-09-07 NOTE — ED Notes (Signed)
Bed: WA21 Expected date:  Expected time:  Means of arrival:  Comments: Triage   

## 2018-09-08 ENCOUNTER — Observation Stay (HOSPITAL_BASED_OUTPATIENT_CLINIC_OR_DEPARTMENT_OTHER): Payer: Medicare Other

## 2018-09-08 DIAGNOSIS — I130 Hypertensive heart and chronic kidney disease with heart failure and stage 1 through stage 4 chronic kidney disease, or unspecified chronic kidney disease: Secondary | ICD-10-CM | POA: Diagnosis present

## 2018-09-08 DIAGNOSIS — E872 Acidosis, unspecified: Secondary | ICD-10-CM | POA: Diagnosis present

## 2018-09-08 DIAGNOSIS — R601 Generalized edema: Secondary | ICD-10-CM | POA: Diagnosis not present

## 2018-09-08 DIAGNOSIS — I472 Ventricular tachycardia: Secondary | ICD-10-CM | POA: Diagnosis present

## 2018-09-08 DIAGNOSIS — Z79899 Other long term (current) drug therapy: Secondary | ICD-10-CM | POA: Diagnosis not present

## 2018-09-08 DIAGNOSIS — N183 Chronic kidney disease, stage 3 (moderate): Secondary | ICD-10-CM | POA: Diagnosis not present

## 2018-09-08 DIAGNOSIS — K760 Fatty (change of) liver, not elsewhere classified: Secondary | ICD-10-CM | POA: Diagnosis present

## 2018-09-08 DIAGNOSIS — R0602 Shortness of breath: Secondary | ICD-10-CM | POA: Diagnosis present

## 2018-09-08 DIAGNOSIS — E440 Moderate protein-calorie malnutrition: Secondary | ICD-10-CM | POA: Diagnosis present

## 2018-09-08 DIAGNOSIS — I5023 Acute on chronic systolic (congestive) heart failure: Secondary | ICD-10-CM | POA: Diagnosis not present

## 2018-09-08 DIAGNOSIS — L03115 Cellulitis of right lower limb: Secondary | ICD-10-CM | POA: Diagnosis present

## 2018-09-08 DIAGNOSIS — I340 Nonrheumatic mitral (valve) insufficiency: Secondary | ICD-10-CM | POA: Diagnosis not present

## 2018-09-08 DIAGNOSIS — Z955 Presence of coronary angioplasty implant and graft: Secondary | ICD-10-CM | POA: Diagnosis not present

## 2018-09-08 DIAGNOSIS — I361 Nonrheumatic tricuspid (valve) insufficiency: Secondary | ICD-10-CM | POA: Diagnosis not present

## 2018-09-08 DIAGNOSIS — Z791 Long term (current) use of non-steroidal anti-inflammatories (NSAID): Secondary | ICD-10-CM | POA: Diagnosis not present

## 2018-09-08 DIAGNOSIS — L03116 Cellulitis of left lower limb: Secondary | ICD-10-CM | POA: Diagnosis present

## 2018-09-08 DIAGNOSIS — N179 Acute kidney failure, unspecified: Secondary | ICD-10-CM | POA: Diagnosis present

## 2018-09-08 DIAGNOSIS — R570 Cardiogenic shock: Secondary | ICD-10-CM | POA: Diagnosis present

## 2018-09-08 DIAGNOSIS — Z8249 Family history of ischemic heart disease and other diseases of the circulatory system: Secondary | ICD-10-CM | POA: Diagnosis not present

## 2018-09-08 DIAGNOSIS — Z7951 Long term (current) use of inhaled steroids: Secondary | ICD-10-CM | POA: Diagnosis not present

## 2018-09-08 DIAGNOSIS — I2510 Atherosclerotic heart disease of native coronary artery without angina pectoris: Secondary | ICD-10-CM | POA: Diagnosis not present

## 2018-09-08 DIAGNOSIS — A419 Sepsis, unspecified organism: Secondary | ICD-10-CM | POA: Diagnosis present

## 2018-09-08 DIAGNOSIS — E785 Hyperlipidemia, unspecified: Secondary | ICD-10-CM | POA: Diagnosis present

## 2018-09-08 DIAGNOSIS — I493 Ventricular premature depolarization: Secondary | ICD-10-CM | POA: Diagnosis not present

## 2018-09-08 DIAGNOSIS — R7303 Prediabetes: Secondary | ICD-10-CM | POA: Diagnosis present

## 2018-09-08 DIAGNOSIS — I5043 Acute on chronic combined systolic (congestive) and diastolic (congestive) heart failure: Secondary | ICD-10-CM | POA: Diagnosis present

## 2018-09-08 DIAGNOSIS — Z9049 Acquired absence of other specified parts of digestive tract: Secondary | ICD-10-CM | POA: Diagnosis not present

## 2018-09-08 DIAGNOSIS — Z7982 Long term (current) use of aspirin: Secondary | ICD-10-CM | POA: Diagnosis not present

## 2018-09-08 DIAGNOSIS — D6959 Other secondary thrombocytopenia: Secondary | ICD-10-CM | POA: Diagnosis present

## 2018-09-08 DIAGNOSIS — Z951 Presence of aortocoronary bypass graft: Secondary | ICD-10-CM | POA: Diagnosis not present

## 2018-09-08 DIAGNOSIS — R6521 Severe sepsis with septic shock: Secondary | ICD-10-CM | POA: Diagnosis present

## 2018-09-08 LAB — COMPREHENSIVE METABOLIC PANEL
ALT: 49 U/L — AB (ref 0–44)
AST: 72 U/L — ABNORMAL HIGH (ref 15–41)
Albumin: 2.8 g/dL — ABNORMAL LOW (ref 3.5–5.0)
Alkaline Phosphatase: 130 U/L — ABNORMAL HIGH (ref 38–126)
Anion gap: 14 (ref 5–15)
BUN: 36 mg/dL — ABNORMAL HIGH (ref 8–23)
CO2: 21 mmol/L — ABNORMAL LOW (ref 22–32)
Calcium: 9.9 mg/dL (ref 8.9–10.3)
Chloride: 104 mmol/L (ref 98–111)
Creatinine, Ser: 1.37 mg/dL — ABNORMAL HIGH (ref 0.61–1.24)
GFR calc Af Amer: 59 mL/min — ABNORMAL LOW (ref >60)
GFR calc non Af Amer: 51 mL/min — ABNORMAL LOW (ref >60)
Glucose, Bld: 110 mg/dL — ABNORMAL HIGH (ref 70–99)
Potassium: 4.5 mmol/L (ref 3.5–5.1)
Sodium: 139 mmol/L (ref 135–145)
Total Bilirubin: 7.9 mg/dL — ABNORMAL HIGH (ref 0.3–1.2)
Total Protein: 6.7 g/dL (ref 6.5–8.1)

## 2018-09-08 LAB — LACTIC ACID, PLASMA
Lactic Acid, Venous: 4 mmol/L (ref 0.5–1.9)
Lactic Acid, Venous: 4.1 mmol/L (ref 0.5–1.9)
Lactic Acid, Venous: 3.7 mmol/L (ref 0.5–1.9)

## 2018-09-08 LAB — BLOOD GAS, VENOUS
Acid-Base Excess: 0.7 mmol/L (ref 0.0–2.0)
Bicarbonate: 25.7 mmol/L (ref 20.0–28.0)
O2 Content: 2 L/min
O2 Saturation: 59.7 %
Patient temperature: 98.2
RATE: 18 {breaths}/min
pCO2, Ven: 43.9 mmHg — ABNORMAL LOW (ref 44.0–60.0)
pH, Ven: 7.384 (ref 7.250–7.430)
pO2, Ven: 34.6 mmHg (ref 32.0–45.0)

## 2018-09-08 LAB — LIPID PANEL
CHOL/HDL RATIO: 3.9 ratio
Cholesterol: 135 mg/dL (ref 0–200)
HDL: 35 mg/dL — ABNORMAL LOW (ref >40)
LDL Cholesterol: 92 mg/dL (ref 0–99)
Triglycerides: 41 mg/dL (ref <150)
VLDL: 8 mg/dL (ref 0–40)

## 2018-09-08 LAB — CBC
HCT: 49.1 % (ref 39.0–52.0)
Hemoglobin: 15.9 g/dL (ref 13.0–17.0)
MCH: 34.1 pg — ABNORMAL HIGH (ref 26.0–34.0)
MCHC: 32.4 g/dL (ref 30.0–36.0)
MCV: 105.4 fL — ABNORMAL HIGH (ref 80.0–100.0)
Platelets: 124 10*3/uL — ABNORMAL LOW (ref 150–400)
RBC: 4.66 MIL/uL (ref 4.22–5.81)
RDW: 14.6 % (ref 11.5–15.5)
WBC: 10.8 10*3/uL — ABNORMAL HIGH (ref 4.0–10.5)
nRBC: 0 % (ref 0.0–0.2)

## 2018-09-08 LAB — PHOSPHORUS: Phosphorus: 3.1 mg/dL (ref 2.5–4.6)

## 2018-09-08 LAB — TROPONIN I
Troponin I: 0.19 ng/mL (ref <0.03)
Troponin I: 0.18 ng/mL (ref <0.03)

## 2018-09-08 LAB — ECHOCARDIOGRAM COMPLETE
Height: 66 in
Weight: 2960 oz

## 2018-09-08 LAB — TSH: TSH: 1.675 u[IU]/mL (ref 0.350–4.500)

## 2018-09-08 LAB — MAGNESIUM: Magnesium: 1.8 mg/dL (ref 1.7–2.4)

## 2018-09-08 LAB — BRAIN NATRIURETIC PEPTIDE: B Natriuretic Peptide: 1619.6 pg/mL — ABNORMAL HIGH (ref 0.0–100.0)

## 2018-09-08 MED ORDER — POLYETHYLENE GLYCOL 3350 17 G PO PACK
17.0000 g | PACK | Freq: Every day | ORAL | Status: DC
  Administered 2018-09-08 – 2018-09-09 (×2): 17 g via ORAL
  Filled 2018-09-08 (×2): qty 1

## 2018-09-08 MED ORDER — BISACODYL 10 MG RE SUPP
10.0000 mg | Freq: Once | RECTAL | Status: AC
  Administered 2018-09-08: 10 mg via RECTAL
  Filled 2018-09-08: qty 1

## 2018-09-08 MED ORDER — MAGNESIUM SULFATE 2 GM/50ML IV SOLN
2.0000 g | Freq: Once | INTRAVENOUS | Status: AC
  Administered 2018-09-08: 2 g via INTRAVENOUS
  Filled 2018-09-08 (×2): qty 50

## 2018-09-08 NOTE — Progress Notes (Signed)
CRITICAL VALUE ALERT  Critical Value: Lactic acid 3.7 Date & Time Notied:  09/08/2018 2224  Provider Notified: Emeline Darling Orders Received/Actions taken: Awaiting new orders will continue to monitor patient.

## 2018-09-08 NOTE — Progress Notes (Signed)
CRITICAL VALUE ALERT  Critical Value:  Lactic Acid 4.9  Date & Time Notied:  09/08/18 2350  Provider Notified: Lafonda Mosses paged via amion  Orders Received/Actions taken:

## 2018-09-08 NOTE — Evaluation (Signed)
Physical Therapy Evaluation Patient Details Name: Stanley Stanley [name] MRN: [id number] DOB: 1945/10/08 Today's Date: 09/08/2018   History of Present Illness  73 y.o. male with medical history significant of CHF, HLD, CAD, PAD, hyperparathyroidism prediabetes, and hepatic steatosis. Presenting with SOB and blisters at BLEs  Clinical Impression   Pt presents with LE weakness L>R, abdominal pain from previous procedure as well as LE pain from ulcers, difficulty performing all mobility tasks, dyspnea on exertion, and decreased activity tolerance. Pt to benefit from acute PT to address deficits. Pt ambulated 5 ft with RW with min assist +2, requiring between min and mod assist +2 for duration of session. Pt reports this is not baseline, as pt tends to perform ADLs for self. Pt also reports swimming 3x/week and being involved in community activities. PT recommending CIR vs SNF to address deficits. PT to progress mobility as tolerated, and will continue to follow acutely.      Follow Up Recommendations CIR;SNF;Supervision/Assistance - 24 hour(CIR vs SNF )    Equipment Recommendations  None recommended by PT    Recommendations for Other Services Rehab consult     Precautions / Restrictions Precautions Precautions: Fall;Other (comment) Precaution Comments: Watch O2 Restrictions Weight Bearing Restrictions: No      Mobility  Bed Mobility Overal bed mobility: Needs Assistance Bed Mobility: Supine to Sit     Supine to sit: Min assist;+2 for physical assistance;+2 for safety/equipment;HOB elevated     General bed mobility comments: Min A to elevate trunk, bring LEs off EOB.   Transfers Overall transfer level: Needs assistance Equipment used: Rolling walker (2 wheeled);None Transfers: Sit to/from [employer]Stand;Stand Pivot Transfers Sit to Stand: Mod assist;+2 physical assistance;+2 safety/equipment;From elevated surface Stand pivot transfers: Mod assist;+2 physical assistance;+2  safety/equipment;From elevated surface       General transfer comment: [facility] A +2 for power up, steadying upon standing. Pt with use of HHA +2 [facility]  Pt requiring Mod A +2 for power [facility] and steadying.  Ambulation/Gait Ambulation/Gait assistance: Min assist;+2 safety/equipment;+2 physical assistance Gait Distance (Feet): 5 Feet Assistive device: Rolling walker (2 wheeled) Gait Pattern/deviations: Step-through pattern;Antalgic;Trunk flexed;Decreased stride length Gait velocity: decr    General Gait Details: Min assist for steadying during ambulation to [facility]. Pt with decreased LLE clearance, difficulty performing hip flexion anti-gravity. Pt with dyspnea 2/4 during short distance ambulation. Verbal cuing for placement in RW.   Stairs            Wheelchair Mobility    Modified Rankin (Stroke Patients Only)       Balance Overall balance assessment: Needs assistance Sitting-balance support: No upper extremity supported;Feet supported Sitting balance-Leahy Scale: Fair Sitting balance - Comments: able to sit EOB without PT support   Standing balance support: Bilateral upper extremity supported;During functional activity Standing balance-Leahy Scale: Poor Standing balance comment: Pt reliant on UE support                             Pertinent Vitals/Pain Pain Assessment: 0-10 Pain Score: 6  Pain Location: Stomach Pain Descriptors / Indicators: Constant;Discomfort Pain Intervention(s): Monitored during session;Repositioned;Limited activity within patient's tolerance    Home Living Family/patient expects to be discharged to:: Private residence Living Arrangements: Non-relatives/Friends(Male roommate and friend) Available Help at Discharge: Friend(s);Available 24 hours/day Type of Home: House Home Access: Stairs to enter Entrance Stairs-Rails: None Entrance Stairs-Number of Steps: 2 +  1   Home Layout: One level Home Equipment: Walker - 2 wheels      Prior Function Level of Independence: Needs assistance   Gait / Transfers Assistance Needed: Uses RW  ADL's / Homemaking Assistance Needed: Performs all BADLs. Rommmate performs IADLs with pt, assists with cooking and cleaning. Pt reports he sometimes needs help getting into and out of bed.         Hand Dominance   Dominant Hand: Right    Extremity/Trunk Assessment   Upper Extremity Assessment Upper Extremity Assessment: Defer to OT evaluation RUE Deficits / Details: Tear at rotator cuff per pt report; 2 month RUE Coordination: decreased gross motor    Lower Extremity Assessment Lower Extremity Assessment: Generalized weakness;LLE deficits/detail;RLE deficits/detail RLE Deficits / Details: grossly 3/5 strength in hip flexion, knee extension in sitting. Blister on RLE, leaking serous fluid.  LLE Deficits / Details: 2+/5 hip flexion, knee extension in sitting (pt reports his LLE feels weaker, and he has to be careful with mobility at home). Open blisters x3, 2 on lower leg and one on knee. Pt reports these to be painful.  LLE Sensation: WNL    Cervical / Trunk Assessment Cervical / Trunk Assessment: Kyphotic  Communication   Communication: No difficulties  Cognition Arousal/Alertness: Awake/alert Behavior During Therapy: WFL for tasks assessed/performed Overall Cognitive Status: Within Functional Limits for tasks assessed                                        General Comments General comments (skin integrity, edema, etc.): PT/OT removed pt O2 during transfer to [facility] When pt was on BSC, sats reading 60-70% but pt breathing normally and talking without difficulty, suspect unreliability of SpO2. Pt became somewhat ShOB after ~5 minutes sitting on BSC, sats stated 94% but PT/OT replaced O2 to ensure oxygenation.     Exercises     Assessment/Plan    PT Assessment Patient needs continued PT  services  PT Problem Stanley [name] Decreased strength;Decreased mobility;Decreased safety awareness;Decreased activity tolerance;Cardiopulmonary status limiting activity;Decreased skin integrity;Decreased balance;Decreased knowledge of use of DME;Pain       PT Treatment Interventions DME instruction;Functional mobility training;Gait training;Therapeutic activities;Balance training;Patient/family education;Neuromuscular re-education;Therapeutic exercise    PT Goals (Current goals can be found in the Care Plan section)  Acute Rehab PT Goals Patient Stated Goal: "Get stronger" PT Goal Formulation: With patient Time For Goal Achievement: 09/22/18 Potential to Achieve Goals: Good    Frequency Min 3X/week   Barriers to discharge        Co-evaluation PT/OT/SLP Co-Evaluation/Treatment: Yes Reason for Co-Treatment: For patient/therapist safety;To address functional/ADL transfers PT goals addressed during session: Mobility/safety with mobility OT goals addressed during session: ADL's and self-care       AM-PAC PT "6 Clicks" Mobility  Outcome Measure Help needed turning from your back to your side while in a flat bed without using bedrails?: A Little Help needed moving from lying on your back to sitting on the side of a flat bed without using bedrails?: A Lot Help needed moving to and from a bed to a chair (including a wheelchair)?: A Lot Help needed standing up from a chair using your arms (e.g., wheelchair or bedside chair)?: A Lot Help needed to walk in hospital room?: A Lot Help needed climbing 3-5 steps with a railing? : A Lot 6 Click Score: 13    End of Session Equipment Utilized  During Treatment: Oxygen Activity Tolerance: Patient tolerated treatment well;Patient limited by fatigue;Treatment limited secondary to medical complications (Comment)(dyspnea ) Patient left: in chair;with call bell/phone within reach;with chair alarm set Nurse Communication: Other (comment)(unable to locate RN  post-session) PT Visit Diagnosis: Other abnormalities of gait and mobility (R26.89);Muscle weakness (generalized) (M62.81)    Time: 3790-2409 PT Time Calculation (min) (ACUTE ONLY): 28 min   Charges:   PT Evaluation $PT Eval Low Complexity: 1 Low          Mattilyn Crites Terrial Rhodes, PT Acute Rehabilitation Services Pager [phone]  Office [phone]   Tyrone Apple D Despina Hidden 09/08/2018, 6:22 PM

## 2018-09-08 NOTE — Progress Notes (Signed)
Patient rectal temp reading 97.2, blood pressure 87/69, unable to get a current pulse ox reading on fingers or earlobes. Telemetry Artist of 5 beat run of v-tach.  MD made aware.  Warm blankets placed on patient, magnesium given, bladder scan done showing no post void residual.  Patient states he is not short of breath and that he "feels good".  Night RN made aware of findings.

## 2018-09-08 NOTE — Progress Notes (Signed)
  Echocardiogram 2D Echocardiogram has been performed.  Dreyton Roessner L Androw 09/08/2018, 8:39 AM

## 2018-09-08 NOTE — Evaluation (Addendum)
Occupational Therapy Evaluation Patient Details Name: Stanley AzureRonald J [name] MRN: [id number] DOB: 05/21/1946 Today's Date: 09/08/2018    History of Present Illness 73 y.o. male with medical history significant of CHF, HLD, CAD, PAD, hyperparathyroidism prediabetes, and hepatic steatosis. Presenting with SOB and blisters at BLEs   Clinical Impression   PTA, pt was living with a roommate and was performing BADLs; roommate performs IADLs. Currently, pt requires Mod-Max A for LB ADLs, Mod-Max A +2 for toileting, and Min-Mod A +2 for functional mobility using RW. Pt motivated to participate in therapy despite pain and fatigue. Pt with SOB and SpO2 reading unreliable fluctuating between 70-90s on RA; placing on 2L O2 and cueing for purse lip breathing. Pt would benefit from further acute OT to facilitate safe dc. Recommend dc to CIR for further OT to optimize safety, independence with ADLs, and return to PLOF.      Follow Up Recommendations  CIR;Supervision/Assistance - 24 hour    Equipment Recommendations  Other (comment)(Defer to next venue)    Recommendations for Other Services PT consult     Precautions / Restrictions Precautions Precautions: Fall;Other (comment) Precaution Comments: Watch O2      Mobility Bed Mobility Overal bed mobility: Needs Assistance Bed Mobility: Supine to Sit     Supine to sit: Min assist;+2 for physical assistance;+2 for safety/equipment;HOB elevated     General bed mobility comments: Min A to elevate trunk.   Transfers Overall transfer level: Needs assistance Equipment used: Rolling walker (2 wheeled);None Transfers: Sit to/from [employer]Stand;Stand Pivot Transfers Sit to Stand: Mod assist;+2 physical assistance;+2 safety/equipment;From elevated surface Stand pivot transfers: Mod assist;+2 physical assistance;+2 safety/equipment;From elevated surface       General transfer comment: Pt performing stand pivot to BSCwith Mod A +2 for power up into standing and  then maintain balance. Pt requiring Mod A +[facility] and then gaian balance in standing.     Balance Overall balance assessment: Needs assistance Sitting-balance support: No upper extremity supported;Feet supported Sitting balance-Leahy Scale: Fair     Standing balance support: Bilateral upper extremity supported;During functional activity Standing balance-Leahy Scale: Poor Standing balance comment: Pt reliant on UE support                           ADL either performed or assessed with clinical judgement   ADL Overall ADL's : Needs assistance/impaired Eating/Feeding: Independent;Sitting   Grooming: Wash/dry hands;Set up;Supervision/safety;Sitting   Upper Body Bathing: Minimal assistance;Sitting   Lower Body Bathing: Moderate assistance;Sit to/from stand   Upper Body Dressing : Minimal assistance;Sitting   Lower Body Dressing: Maximal assistance;Sit to/from stand Lower Body Dressing Details (indicate cue type and reason): Donned socks Toilet Transfer: Moderate assistance;+2 for safety/equipment;RW;BSC;Stand-pivot Toilet Transfer Details (indicate cue type and reason): Mod A +2 [facility]. Pt presenting with decreased strength and balance Toileting- Clothing Manipulation and Hygiene: Maximal assistance;+2 for physical assistance;Sit to/from stand Toileting - Clothing Manipulation Details (indicate cue type and reason): Max A for toilet hygiene after BM.      Functional mobility during ADLs: Minimal assistance;+2 for physical assistance;+2 for safety/equipment;Rolling walker General ADL Comments: Pt presenting with decreased strength, balance, and activity tolerance     Vision Baseline Vision/History: Wears glasses Wears Glasses: At all times("As needed") Patient Visual Report: No change from baseline       Perception     Praxis      Pertinent Vitals/Pain Pain Assessment: 0-10 Pain Score:  6  Pain Location: Stomach Pain Descriptors  / Indicators: Constant;Discomfort Pain Intervention(s): Monitored during session;Limited activity within patient's tolerance;Repositioned     Hand Dominance Right   Extremity/Trunk Assessment Upper Extremity Assessment Upper Extremity Assessment: Generalized weakness;RUE deficits/detail RUE Deficits / Details: Tear at rotator cuff per pt report; 2 month RUE Coordination: decreased gross motor   Lower Extremity Assessment Lower Extremity Assessment: Defer to PT evaluation   Cervical / Trunk Assessment Cervical / Trunk Assessment: Kyphotic   Communication Communication Communication: No difficulties   Cognition Arousal/Alertness: Awake/alert Behavior During Therapy: WFL for tasks assessed/performed Overall Cognitive Status: Within Functional Limits for tasks assessed                                     General Comments  Pt with shortness of breath and unsure of reliability of SpO2 reading. Keeping pt on 2L O2.    Exercises     Shoulder Instructions      Home Living Family/patient expects to be discharged to:: Private residence Living Arrangements: Non-relatives/Friends(Male roommate and friend) Available Help at Discharge: Friend(s);Available 24 hours/day Type of Home: House Home Access: Stairs to enter [employer] of Steps: 2 and 1 Entrance Stairs-Rails: None Home Layout: One level     Bathroom Shower/Tub: Producer, television/film/video: Standard     Home Equipment: Environmental consultant - 2 wheels          Prior Functioning/Environment Level of Independence: Needs assistance  Gait / Transfers Assistance Needed: Uses RW ADL's / Homemaking Assistance Needed: Performs all BADLs. Rommmate performs IADLs            OT Problem List: Decreased strength;Decreased range of motion;Decreased activity tolerance;Impaired balance (sitting and/or standing);Decreased knowledge of use of DME or AE;Decreased knowledge of precautions;Pain      OT  Treatment/Interventions: Self-care/ADL training;Therapeutic exercise;Energy conservation;DME and/or AE instruction;Therapeutic activities;Patient/family education    OT Goals(Current goals can be found in the care plan section) Acute Rehab OT Goals Patient Stated Goal: "Get stronger" OT Goal Formulation: With patient Time For Goal Achievement: 09/22/18 Potential to Achieve Goals: Good  OT Frequency: Min 2X/week   Barriers to D/C:            Co-evaluation PT/OT/SLP Co-Evaluation/Treatment: Yes Reason for Co-Treatment: For patient/therapist safety;To address functional/ADL transfers   OT goals addressed during session: ADL's and self-care      AM-PAC OT "6 Clicks" Daily Activity     Outcome Measure Help from another person eating meals?: None Help from another person taking care of personal grooming?: A Little Help from another person toileting, which includes using toliet, bedpan, or urinal?: A Lot Help from another person bathing (including washing, rinsing, drying)?: A Lot Help from another person to put on and taking off regular upper body clothing?: A Little Help from another person to put on and taking off regular lower body clothing?: A Lot 6 Click Score: 16   End of Session Equipment Utilized During Treatment: Rolling walker;Oxygen Nurse Communication: Mobility status  Activity Tolerance: Patient tolerated treatment well;Patient limited by pain Patient left: in chair;with call bell/phone within reach;with chair alarm set  OT Visit Diagnosis: Unsteadiness on feet (R26.81);Other abnormalities of gait and mobility (R26.89);Muscle weakness (generalized) (M62.81);Pain                Time: 9450-3888 OT Time Calculation (min): 28 min Charges:  OT General Charges $OT Visit: 1 Visit OT  Evaluation $OT Eval Moderate Complexity: 1 Mod  Yerlin Gasparyan MSOT, OTR/L Acute Rehab Pager: [phone] Office: [phone]  Theodoro Grist Kaiyah Eber 09/08/2018, 5:35 PM

## 2018-09-08 NOTE — Progress Notes (Signed)
Pt last BP 84/63, MD aware. Lasix to be held if systolic <100.
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
## 2018-09-08 NOTE — Progress Notes (Signed)
CRITICAL VALUE ALERT  Critical Value:  Lactic 4.1   Date & Time Notied: 09/08/2018 1946   Provider Notified: Emeline Darling  Orders Received/Actions taken: Awaiting new orders will continue to monitor patient

## 2018-09-09 ENCOUNTER — Inpatient Hospital Stay (HOSPITAL_COMMUNITY): Payer: Medicare Other

## 2018-09-09 LAB — CBC
HCT: 46.2 % (ref 39.0–52.0)
HCT: 50.5 % (ref 39.0–52.0)
Hemoglobin: 14.7 g/dL (ref 13.0–17.0)
Hemoglobin: 15.9 g/dL (ref 13.0–17.0)
MCH: 33.9 pg (ref 26.0–34.0)
MCH: 33.1 pg (ref 26.0–34.0)
MCHC: 31.8 g/dL (ref 30.0–36.0)
MCHC: 31.5 g/dL (ref 30.0–36.0)
MCV: 106.5 fL — ABNORMAL HIGH (ref 80.0–100.0)
MCV: 105 fL — ABNORMAL HIGH (ref 80.0–100.0)
PLATELETS: 105 10*3/uL — AB (ref 150–400)
Platelets: 119 10*3/uL — ABNORMAL LOW (ref 150–400)
RBC: 4.34 MIL/uL (ref 4.22–5.81)
RBC: 4.81 MIL/uL (ref 4.22–5.81)
RDW: 14.5 % (ref 11.5–15.5)
RDW: 14.3 % (ref 11.5–15.5)
WBC: 9.8 10*3/uL (ref 4.0–10.5)
WBC: 8.6 10*3/uL (ref 4.0–10.5)
nRBC: 0 % (ref 0.0–0.2)
nRBC: 0 % (ref 0.0–0.2)

## 2018-09-09 LAB — COMPREHENSIVE METABOLIC PANEL
ALBUMIN: 2.7 g/dL — AB (ref 3.5–5.0)
ALT: 41 U/L (ref 0–44)
ALT: 40 U/L (ref 0–44)
AST: 46 U/L — ABNORMAL HIGH (ref 15–41)
AST: 52 U/L — ABNORMAL HIGH (ref 15–41)
Albumin: 2.4 g/dL — ABNORMAL LOW (ref 3.5–5.0)
Alkaline Phosphatase: 113 U/L (ref 38–126)
Alkaline Phosphatase: 123 U/L (ref 38–126)
Anion gap: 11 (ref 5–15)
Anion gap: 11 (ref 5–15)
BUN: 39 mg/dL — ABNORMAL HIGH (ref 8–23)
BUN: 37 mg/dL — ABNORMAL HIGH (ref 8–23)
CHLORIDE: 103 mmol/L (ref 98–111)
CO2: 22 mmol/L (ref 22–32)
CO2: 23 mmol/L (ref 22–32)
Calcium: 9.8 mg/dL (ref 8.9–10.3)
Calcium: 9.8 mg/dL (ref 8.9–10.3)
Chloride: 104 mmol/L (ref 98–111)
Creatinine, Ser: 1.32 mg/dL — ABNORMAL HIGH (ref 0.61–1.24)
Creatinine, Ser: 1.34 mg/dL — ABNORMAL HIGH (ref 0.61–1.24)
GFR calc Af Amer: >60 mL/min (ref >60)
GFR calc Af Amer: >60 mL/min (ref >60)
GFR calc non Af Amer: 54 mL/min — ABNORMAL LOW (ref >60)
GFR calc non Af Amer: 53 mL/min — ABNORMAL LOW (ref >60)
Glucose, Bld: 105 mg/dL — ABNORMAL HIGH (ref 70–99)
Glucose, Bld: 92 mg/dL (ref 70–99)
Potassium: 4.5 mmol/L (ref 3.5–5.1)
Potassium: 4.4 mmol/L (ref 3.5–5.1)
SODIUM: 137 mmol/L (ref 135–145)
Sodium: 137 mmol/L (ref 135–145)
Total Bilirubin: 6.3 mg/dL — ABNORMAL HIGH (ref 0.3–1.2)
Total Bilirubin: 6.7 mg/dL — ABNORMAL HIGH (ref 0.3–1.2)
Total Protein: 6 g/dL — ABNORMAL LOW (ref 6.5–8.1)
Total Protein: 6.8 g/dL (ref 6.5–8.1)

## 2018-09-09 LAB — LACTIC ACID, PLASMA
LACTIC ACID, VENOUS: 2.4 mmol/L — AB (ref 0.5–1.9)
LACTIC ACID, VENOUS: 2.2 mmol/L — AB (ref 0.5–1.9)
Lactic Acid, Venous: 3.2 mmol/L (ref 0.5–1.9)
Lactic Acid, Venous: 3.4 mmol/L (ref 0.5–1.9)

## 2018-09-09 LAB — HAPTOGLOBIN: Haptoglobin: 19 mg/dL — ABNORMAL LOW (ref 34–355)

## 2018-09-09 MED ORDER — SODIUM CHLORIDE 0.9 % IV SOLN: INTRAVENOUS | Status: DC | PRN

## 2018-09-09 MED ORDER — VANCOMYCIN HCL 10 G IV SOLR
1750.0000 mg | INTRAVENOUS | Status: AC
  Administered 2018-09-09: 1750 mg via INTRAVENOUS
  Filled 2018-09-09: qty 1750

## 2018-09-09 MED ORDER — SODIUM CHLORIDE 0.9 % IV SOLN
INTRAVENOUS | Status: DC
  Administered 2018-09-09: 23:00:00 via INTRAVENOUS

## 2018-09-09 MED ORDER — ADULT MULTIVITAMIN W/MINERALS CH
1.0000 | ORAL_TABLET | Freq: Every day | ORAL | Status: DC
  Administered 2018-09-10 – 2018-09-17 (×8): 1 via ORAL
  Filled 2018-09-09 (×8): qty 1

## 2018-09-09 MED ORDER — VANCOMYCIN HCL 10 G IV SOLR
1250.0000 mg | INTRAVENOUS | Status: DC
  Administered 2018-09-10 – 2018-09-11 (×2): 1250 mg via INTRAVENOUS
  Filled 2018-09-09 (×2): qty 1250

## 2018-09-09 MED ORDER — PRO-STAT SUGAR FREE PO LIQD
30.0000 mL | Freq: Two times a day (BID) | ORAL | Status: DC
  Administered 2018-09-10 – 2018-09-17 (×14): 30 mL via ORAL
  Filled 2018-09-09 (×14): qty 30

## 2018-09-09 MED ORDER — SODIUM CHLORIDE 0.9 % IV BOLUS
500.0000 mL | Freq: Once | INTRAVENOUS | Status: AC
  Administered 2018-09-09: 500 mL via INTRAVENOUS

## 2018-09-09 NOTE — Progress Notes (Signed)
Pharmacy Antibiotic Note  Stanley [name] is a 73 y.o. male admitted on 09/07/2018 with sepsis secondary to cellulitis. Patient initially placed on Ceftriaxone, now MD is adding Vancomycin. Pharmacy has been consulted for Vancomycin dosing.  Plan: Vancomycin 1750mg  IV x 1, then 1250mg  IV q24h.  Plan for Vancomycin levels at steady state, as indicated.  Ceftriaxone 2g IV q24h per MD.  Monitor renal function, cultures, clinical course.   Height: 5\' 6"  (167.6 cm) Weight: 179 lb 10.8 oz (81.5 kg) IBW/kg (Calculated) : 63.8  Temp (24hrs), Avg:97.5 F (36.4 C), Min:96.8 F (36 C), Max:98 F (36.7 C)  Recent Labs  Lab 09/07/18 1507  09/08/18 0130  09/08/18 1846 09/08/18 2114 09/09/18 0422 09/09/18 0430 09/09/18 0837 09/09/18 1549  WBC 12.2*  --  10.8*  --   --   --  9.8  --   --  8.6  CREATININE 1.41*  --  1.37*  --   --   --  1.32*  --   --  1.34*  LATICACIDVEN  --    < >  --    < > 4.1* 3.7*  --  2.4* 2.2* 3.2*   < > = values in this interval not displayed.    Estimated Creatinine Clearance: 50 mL/min (A) (by C-G formula based on SCr of 1.34 mg/dL (H)).    Allergies  Allergen Reactions  . Meloxicam Other (See Comments)    Joint aching   . Methocarbamol Other (See Comments)    UNKNOWN TO PATIENT  . Prednisone Shortness Of Breath  . Statins Other (See Comments) and Anaphylaxis    Myalgias, even with Crestor once weekly  . Zetia [Ezetimibe] Other (See Comments)    Full body myalgias  . Losartan     Possible contribution to dry cough  . Prabotulinumtoxina     Antimicrobials this admission: 3/13 Clindamycin x 1 3/13 Ceftriaxone >>  3/15 Vancomycin >>  Dose adjustments this admission: --  Microbiology results: 3/13 BCx: NGTD 3/13 MRSA PCR: negative   Thank you for allowing pharmacy to be a part of this patient's care.   Greer Pickerel, PharmD, BCPS Pager: [phone] 09/09/2018 5:41 PM

## 2018-09-09 NOTE — Progress Notes (Addendum)
   09/09/18 1212  Vitals  BP (!) 82/68 (Dr. Jerolyn Center paged and notified)  BP Location Left Arm  BP Method Manual  Pulse Rate 91  Resp 16  Oxygen Therapy  SpO2 100 %  O2 Device Nasal Cannula  O2 Flow Rate (L/min) 2 L/min  MEWS Score  MEWS RR 0  MEWS Pulse 0  MEWS Systolic 1  MEWS LOC 0  MEWS Temp 0  MEWS Score 1  MEWS Score Color Green   Dr. Jerolyn Center notified of patient's VS.

## 2018-09-09 NOTE — Progress Notes (Signed)
CRITICAL VALUE ALERT  Critical Value:  Lactic acid 2.4  Date & Time Notied:  09/09/2018 0617   Provider Notified: Emeline Darling   Orders Received/Actions taken: Awaiting new orders will continue to monitor

## 2018-09-09 NOTE — Progress Notes (Signed)
Aline attempted x 2 by 2 RTs. Attempts unsuccessful. RN notified

## 2018-09-09 NOTE — Progress Notes (Signed)
On call provider B. Kyere notified about patients temp of 96.8. 2022 Verbal conversation with  On call provider and was told to put warm blankets on patient, turn up temp in the room, and wait an hour then recheck temp.  Repaged on call provider Kyere  at 2056 after speaking with rapid response about recommending a  bear hugger. On Call provider expressed to continue with the warm blankets and recheck temp in an hour.  Will continue to monitor patient closely

## 2018-09-09 NOTE — Progress Notes (Signed)
   Dr Jerolyn Center of [facility] called - concerned that SBP is low but unsure if spurious low v true low  Plan  - [facility] can move to [facility]  - insert aline v other bp in other locations -> if real low can call ccm consult     SIGNATURE    Dr. Kalman Shan, M.D., F.C.C.P,  Pulmonary and Critical Care Medicine Staff Physician, [facility] Director - Interstitial Lung Disease  Program  Pulmonary Fibrosis [facility] at [facility] [place], [place], [address]  Pager: [phone], If no answer or between  15:00h - 7:00h: call [phone] Telephone: [phone]  2:50 PM 09/09/2018

## 2018-09-09 NOTE — Progress Notes (Signed)
Dr. Jerolyn Center returned page.  No new orders at this time.  Continue to monitor VS.
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
## 2018-09-09 NOTE — Progress Notes (Signed)
Rehab Admissions Coordinator Note:  Per PT and OT recommendation, this patient was screened by Nanine Means for appropriateness for an Inpatient Acute Rehab Consult.  At this time, we are recommending Inpatient Rehab consult. AC will contact MD to request an IP Rehab Consult Order.   Nanine Means 09/09/2018, 10:24 AM  I can be reached at [phone].

## 2018-09-09 NOTE — Progress Notes (Addendum)
   09/09/18 1359  Vitals  BP (!) 78/60  BP Location Left Arm  BP Method Manual  Resp (!) 30  Dr. Jerolyn Center called and notified of VS.  Patient reports feeling "more wiped out and short of breath."  Dr. Jerolyn Center stated she will place order to transfer to SDU.

## 2018-09-09 NOTE — Progress Notes (Signed)
CRITICAL VALUE ALERT  Critical Value:  Lactic Acid 2.2  Date & Time Notied:  09/09/2018 at 0940  Provider Notified: Dr. Jerolyn Center  Orders Received/Actions taken:  No new orders at this time. Cardiology to see patient.

## 2018-09-09 NOTE — Progress Notes (Signed)
   09/09/18 1325  Vitals  BP (!) 80/72  BP Location Left Arm  BP Method Automatic  Patient Position (if appropriate) Lying  Pulse Rate 88  Pulse Rate Source Monitor  MEWS Score  MEWS RR 0  MEWS Pulse 0  MEWS Systolic 2  MEWS LOC 0  MEWS Temp 0  MEWS Score 2  MEWS Score Color Yellow  Patient reports he feels "tired".  Reports this earlier today.  Output since Lasix 450 ml amber colored urine.  Dr. Jerolyn Center paged and notified of above and VS.

## 2018-09-09 NOTE — Progress Notes (Addendum)
Report called to SDU Mora Bellman, RN.
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
## 2018-09-09 NOTE — Progress Notes (Signed)
Initial Nutrition Assessment  DOCUMENTATION CODES:   Non-severe (moderate) malnutrition in context of chronic illness  INTERVENTION:  - Continue Ensure Enlive BID, each supplement provides 350 kcal and 20 grams of protein. - Will order 30 mL Prostat BID, each supplement provides 100 kcal and 15 grams of protein. - Will order daily multivitamin with minerals. - Continue to encourage PO intakes. - Will continue to monitor for nutrition-related needs including any need for further education prior to d/c.    NUTRITION DIAGNOSIS:   Moderate Malnutrition related to chronic illness as evidenced by mild fat depletion, moderate fat depletion, mild muscle depletion, moderate muscle depletion.  GOAL:   Patient will meet greater than or equal to 90% of their needs  MONITOR:   PO intake, Supplement acceptance, Weight trends, Labs, I & O's  REASON FOR ASSESSMENT:   Malnutrition Screening Tool, Consult Malnutrition Eval  ASSESSMENT:   73 y.o. male with medical history significant of CHF, HLD, CAD, PAD, hyperparathyroidism, prediabetes, and hepatic steatosis. He presented to the ED with SOB, diffuse fatigue, and lower extremity swelling up to the waist. Symptoms started about 2.5 weeks ago. SOB has gotten progressively worse. He usually takes diuretics but stopped taking them because it was making him urinate too much and he was getting too weak to get to the bathroom. His leg edema has been chronic but getting much worse now. He reports having no appetite and hardly eating which has caused him to lose weight.  BMI indicates overweight status. Patient has been eating 100% of meals since admission without any abdominal pain, nausea, or chewing or swallowing issues. He states that he has been feeling better since admission and is able to eat better because of that.   Noted that patient was seen by another RD in January at which time patient reported poor appetite for ~1 month with mainly only  bites of meals. Patient reports that after d/c from that admission he was eating better but still not eating large meals TID. He states that he was eating this way until about 2 weeks ago when he began to have symptoms (as outlined above) which led to decreased mobility/decreased ability to access food items.   Per chart review, current weight is 181 lb. This is consistent with weights from 03/05/18-04/25/18, but noted that patient currently has deep pitting edema to BLE, up to waist. Weight on 07/02/18 was 170 lb. Current dry weight likely closer to (or even below) that weight.   Medications reviewed; 40 mg IV lasix/day, 2 g IV Mg sulfate x1 run 3/14, 80 mg oral protonix/day, 1 packet miralax/day. Labs reviewed; BUN: 39 mg/dl, creatinine: 5.46 mg/dl.      NUTRITION - FOCUSED PHYSICAL EXAM:    Most Recent Value  Orbital Region  Mild depletion  Upper Arm Region  Moderate depletion  Thoracic and Lumbar Region  Unable to assess  Buccal Region  No depletion  Temple Region  Moderate depletion  Clavicle Bone Region  Moderate depletion  Clavicle and Acromion Bone Region  Moderate depletion  Scapular Bone Region  No depletion  Dorsal Hand  No depletion  Patellar Region  No depletion  Anterior Thigh Region  No depletion  Posterior Calf Region  No depletion  Edema (RD Assessment)  Severe [BLE,  mild edema to BUE]  Hair  Reviewed  Eyes  Reviewed  Mouth  Reviewed  Skin  Reviewed  Nails  Reviewed       Diet Order:   Diet Order  Diet 2 gram sodium Room service appropriate? Yes; Fluid consistency: Thin  Diet effective now              EDUCATION NEEDS:   No education needs have been identified at this time  Skin:  Skin Assessment: Reviewed RN Assessment  Last BM:  3/14  Height:   Ht Readings from Last 1 Encounters:  09/07/18 5\' 6"  (1.676 m)    Weight:   Wt Readings from Last 1 Encounters:  09/09/18 82.3 kg    Ideal Body Weight:  64.54 kg  BMI:  Body mass  index is 29.28 kg/m.  Estimated Nutritional Needs:   Kcal:  1810-2060 kcal  Protein:  85-100 grams  Fluid:  >/= 1.5 L/day     Trenton Gammon, MS, RD, LDN, [facility] Inpatient Clinical Dietitian Pager # [phone] After hours/weekend pager # [phone]

## 2018-09-10 DIAGNOSIS — I5043 Acute on chronic combined systolic (congestive) and diastolic (congestive) heart failure: Secondary | ICD-10-CM

## 2018-09-10 DIAGNOSIS — R601 Generalized edema: Secondary | ICD-10-CM

## 2018-09-10 DIAGNOSIS — I2510 Atherosclerotic heart disease of native coronary artery without angina pectoris: Secondary | ICD-10-CM

## 2018-09-10 LAB — COMPREHENSIVE METABOLIC PANEL
ALT: 36 U/L (ref 0–44)
AST: 41 U/L (ref 15–41)
Albumin: 2.5 g/dL — ABNORMAL LOW (ref 3.5–5.0)
Alkaline Phosphatase: 116 U/L (ref 38–126)
Anion gap: 9 (ref 5–15)
BUN: 38 mg/dL — ABNORMAL HIGH (ref 8–23)
CO2: 24 mmol/L (ref 22–32)
Calcium: 9.4 mg/dL (ref 8.9–10.3)
Chloride: 104 mmol/L (ref 98–111)
Creatinine, Ser: 1.28 mg/dL — ABNORMAL HIGH (ref 0.61–1.24)
GFR calc Af Amer: >60 mL/min (ref >60)
GFR calc non Af Amer: 56 mL/min — ABNORMAL LOW (ref >60)
GLUCOSE: 119 mg/dL — AB (ref 70–99)
Potassium: 3.8 mmol/L (ref 3.5–5.1)
Sodium: 137 mmol/L (ref 135–145)
Total Bilirubin: 5.8 mg/dL — ABNORMAL HIGH (ref 0.3–1.2)
Total Protein: 6.3 g/dL — ABNORMAL LOW (ref 6.5–8.1)

## 2018-09-10 LAB — MAGNESIUM: Magnesium: 1.9 mg/dL (ref 1.7–2.4)

## 2018-09-10 LAB — HEPATITIS PANEL, ACUTE
HCV Ab: 0.1 s/co ratio (ref 0.0–0.9)
Hep A IgM: NEGATIVE
Hep B C IgM: NEGATIVE
Hepatitis B Surface Ag: NEGATIVE

## 2018-09-10 MED ORDER — FUROSEMIDE 10 MG/ML IJ SOLN
40.0000 mg | Freq: Two times a day (BID) | INTRAMUSCULAR | Status: DC
  Administered 2018-09-10 – 2018-09-12 (×4): 40 mg via INTRAVENOUS
  Filled 2018-09-10 (×4): qty 4

## 2018-09-10 MED ORDER — MUPIROCIN CALCIUM 2 % EX CREA
TOPICAL_CREAM | Freq: Every day | CUTANEOUS | Status: DC
  Administered 2018-09-10 – 2018-09-17 (×8): via TOPICAL
  Filled 2018-09-10 (×2): qty 15

## 2018-09-10 MED ORDER — ORAL CARE MOUTH RINSE
15.0000 mL | Freq: Two times a day (BID) | OROMUCOSAL | Status: DC
  Administered 2018-09-10 – 2018-09-16 (×11): 15 mL via OROMUCOSAL

## 2018-09-10 MED ORDER — CHLORHEXIDINE GLUCONATE CLOTH 2 % EX PADS
6.0000 | MEDICATED_PAD | Freq: Every day | CUTANEOUS | Status: DC
  Administered 2018-09-11 – 2018-09-14 (×3): 6 via TOPICAL

## 2018-09-10 NOTE — Progress Notes (Signed)
[facility] informed of pt soft b/p, pt A/O with map >65 will continue to monitor, no new orders at this time.
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
## 2018-09-10 NOTE — Progress Notes (Signed)
CRITICAL VALUE ALERT  Critical Value:  Lactic acid 3.4  Date & Time Notied:  09/09/2018 @ 2103  Provider Notified: [facility] 1  Orders Received/Actions taken: yes
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
## 2018-09-10 NOTE — Progress Notes (Signed)
Dr Wynona Neat states to call if MAP less than 65.

## 2018-09-10 NOTE — Progress Notes (Signed)
IP rehab admissions - We received a rehab consult.  I will meet with patient in am to discuss rehab options and potential inpatient rehab admission.  Call me for questions.  [phone]

## 2018-09-10 NOTE — Consult Note (Addendum)
WOC Nurse wound consult note Reason for Consult: Consult requested for bilat legs.  Pt has generalized cellulitis and edema to BLE, which evolved into blisters. Wound type: Left leg with full thickness wound where previous blisters have ruptured to left anterior leg.  10X16X.2cm, mod amt yellow drainage, no odor, loose peeling skin surrounding the wound bed.  80% red and moist, 20% yellow (5X3cm area) near top of wound.   Right leg with partial thickness wounds where previous blisters have ruptured; just below knee 1X1X.1cm, pink and dry, left inner foot .8X.8X.1cm, pink and moist, and left anterior calf with ruptured blister with intact skin, 6X6cm, large amt yellow drainage Dressing procedure/placement/frequency: Foam dressings to right foot and knee to promote healing.  Bactroban to promote healing to right upper wound.  Vaseline gauze to promote moist healing and minimize dressing from adhering daily, instructions provided for bedside nurses to perform.  Discussed plan of care with patient. Please re-consult if further assistance is needed.  Thank-you,  Cammie Mcgee MSN, RN, CWOCN, [place], CNS [phone]

## 2018-09-11 ENCOUNTER — Inpatient Hospital Stay (HOSPITAL_COMMUNITY): Payer: Medicare Other

## 2018-09-11 LAB — CBC
HCT: 43.2 % (ref 39.0–52.0)
Hemoglobin: 14.1 g/dL (ref 13.0–17.0)
MCH: 33.7 pg (ref 26.0–34.0)
MCHC: 32.6 g/dL (ref 30.0–36.0)
MCV: 103.3 fL — ABNORMAL HIGH (ref 80.0–100.0)
Platelets: 96 10*3/uL — ABNORMAL LOW (ref 150–400)
RBC: 4.18 MIL/uL — AB (ref 4.22–5.81)
RDW: 14 % (ref 11.5–15.5)
WBC: 7.8 10*3/uL (ref 4.0–10.5)
nRBC: 0 % (ref 0.0–0.2)

## 2018-09-11 LAB — COMPREHENSIVE METABOLIC PANEL
ALT: 30 U/L (ref 0–44)
AST: 33 U/L (ref 15–41)
Albumin: 2.3 g/dL — ABNORMAL LOW (ref 3.5–5.0)
Alkaline Phosphatase: 103 U/L (ref 38–126)
Anion gap: 9 (ref 5–15)
BUN: 36 mg/dL — ABNORMAL HIGH (ref 8–23)
CHLORIDE: 104 mmol/L (ref 98–111)
CO2: 24 mmol/L (ref 22–32)
CREATININE: 1.2 mg/dL (ref 0.61–1.24)
Calcium: 9 mg/dL (ref 8.9–10.3)
GFR calc Af Amer: >60 mL/min (ref >60)
GFR calc non Af Amer: >60 mL/min (ref >60)
Glucose, Bld: 94 mg/dL (ref 70–99)
Potassium: 3.2 mmol/L — ABNORMAL LOW (ref 3.5–5.1)
Sodium: 137 mmol/L (ref 135–145)
Total Bilirubin: 5.8 mg/dL — ABNORMAL HIGH (ref 0.3–1.2)
Total Protein: 5.7 g/dL — ABNORMAL LOW (ref 6.5–8.1)

## 2018-09-11 MED ORDER — POTASSIUM CHLORIDE CRYS ER 20 MEQ PO TBCR
40.0000 meq | EXTENDED_RELEASE_TABLET | Freq: Once | ORAL | Status: AC
  Administered 2018-09-11: 40 meq via ORAL
  Filled 2018-09-11: qty 2

## 2018-09-11 MED ORDER — MIDODRINE HCL 5 MG PO TABS
5.0000 mg | ORAL_TABLET | Freq: Two times a day (BID) | ORAL | Status: DC
  Administered 2018-09-11 – 2018-09-17 (×12): 5 mg via ORAL
  Filled 2018-09-11 (×13): qty 1

## 2018-09-11 NOTE — Progress Notes (Signed)
Patient had 5 beat run of vtach. No changes in patient. MD paged. Will monitor closely.

## 2018-09-11 NOTE — Progress Notes (Signed)
Progress Note  Patient Name: Stanley [name] Date of Encounter: 09/11/2018  Primary Cardiologist: [name], MD   Subjective   Feeling better.  Breathing better is improving.  Inpatient Medications    Scheduled Meds: . aspirin EC  81 mg Oral Daily  . Chlorhexidine Gluconate Cloth  6 each Topical Daily  . enoxaparin (LOVENOX) injection  40 mg Subcutaneous Q24H  . feeding supplement (ENSURE ENLIVE)  237 mL Oral BID BM  . feeding supplement (PRO-STAT SUGAR FREE 64)  30 mL Oral BID  . furosemide  40 mg Intravenous BID  . guaiFENesin  600 mg Oral BID  . latanoprost  1 drop Both Eyes QHS  . mouth rinse  15 mL Mouth Rinse BID  . multivitamin with minerals  1 tablet Oral Daily  . mupirocin cream   Topical Daily  . pantoprazole  80 mg Oral Daily  . sodium chloride flush  3 mL Intravenous Q12H   Continuous Infusions: . sodium chloride    . sodium chloride    . cefTRIAXone (ROCEPHIN)  IV Stopped (09/10/18 2209)  . vancomycin Stopped (09/10/18 1944)   PRN Meds: sodium chloride, [CANCELED] Place/Maintain arterial line **AND** sodium chloride, acetaminophen **OR** acetaminophen, colchicine, levalbuterol, sodium chloride flush   Vital Signs    Vitals:   09/11/18 0700 09/11/18 0800 09/11/18 0815 09/11/18 0900  BP: 92/61 95/60  (!) 88/60  Pulse:      Resp: (!) 26 18  (!) 26  Temp:   97.9 F (36.6 C)   TempSrc:   Axillary   SpO2: 99% 99%  99%  Weight:      Height:        Intake/Output Summary (Last 24 hours) at 09/11/2018 1112 Last data filed at 09/11/2018 0900 Gross per 24 hour  Intake 327.58 ml  Output 1150 ml  Net -822.42 ml   Last 3 Weights 09/11/2018 09/09/2018 09/09/2018  Weight (lbs) 182 lb 5.1 oz 179 lb 10.8 oz 181 lb 7 oz  Weight (kg) 82.7 kg 81.5 kg 82.3 kg      Telemetry    Sinus rhythm - Personally Reviewed  ECG    n/a - Personally Reviewed  Physical Exam   VS:  BP (!) 88/60   Pulse 82   Temp 97.9 F (36.6 C) (Axillary)   Resp (!) 26   Ht 5'  6" (1.676 m)   Wt 82.7 kg   SpO2 99%   BMI 29.43 kg/m  , BMI Body mass index is 29.43 kg/m. GENERAL:  Chronically ill-appearing HEENT: Pupils equal round and reactive, fundi not visualized, oral mucosa unremarkable NECK:  No jugular venous distention, waveform within normal limits, carotid upstroke brisk and symmetric, no bruits LUNGS:  Clear to auscultation bilaterally HEART:  RRR.  PMI not displaced or sustained,S1 and S2 within normal limits, no S3, no S4, no clicks, no rubs, II/VI systolic murmur at the apex ABD:  Flat, positive bowel sounds normal in frequency in pitch, no bruits, no rebound, no guarding, no midline pulsatile mass, no hepatomegaly, no splenomegaly EXT:  2 plus pulses throughout, 2+ LE edema to the upper tibia bilaterally, no cyanosis no clubbing SKIN:  No rashes no nodules NEURO:  Cranial nerves II through XII grossly intact, motor grossly intact throughout PSYCH:  Cognitively intact, oriented to person place and time   Labs    Chemistry Recent Labs  Lab 09/09/18 1549 09/10/18 0347 09/11/18 0537  NA 137 137 137  K 4.4 3.8 3.2*  CL  103 104 104  CO2 23 24 24   GLUCOSE 92 119* 94  BUN 37* 38* 36*  CREATININE 1.34* 1.28* 1.20  CALCIUM 9.8 9.4 9.0  PROT 6.8 6.3* 5.7*  ALBUMIN 2.7* 2.5* 2.3*  AST 52* 41 33  ALT 40 36 30  ALKPHOS 123 116 103  BILITOT 6.7* 5.8* 5.8*  GFRNONAA 53* 56* >60  GFRAA >60 >60 >60  ANIONGAP 11 9 9      Hematology Recent Labs  Lab 09/09/18 0422 09/09/18 1549 09/11/18 0924  WBC 9.8 8.6 7.8  RBC 4.34 4.81 4.18*  HGB 14.7 15.9 14.1  HCT 46.2 50.5 43.2  MCV 106.5* 105.0* 103.3*  MCH 33.9 33.1 33.7  MCHC 31.8 31.5 32.6  RDW 14.5 14.3 14.0  PLT 105* 119* 96*    Cardiac Enzymes Recent Labs  Lab 09/07/18 1935 09/08/18 0108 09/08/18 0704  TROPONINI 0.11* 0.19* 0.18*    Recent Labs  Lab 09/07/18 1615  TROPIPOC 0.28*     BNP Recent Labs  Lab 09/07/18 1528 09/08/18 1124  BNP 1,691.4* 1,619.6*     DDimer No  results for input(s): DDIMER in the last 168 hours.   Radiology    Dg Chest 1 View  Result Date: 09/09/2018 CLINICAL DATA:  Increased respirations.  Low blood pressure. EXAM: CHEST  1 VIEW COMPARISON:  September 09, 2018 FINDINGS: Stable cardiomegaly. The hila and mediastinum are normal. Bilateral pleural effusions with underlying opacities are stable. The lungs are otherwise clear. IMPRESSION: Persistent bilateral pleural effusions with underlying compressive atelectasis. Electronically Signed   By: Gerome Sam III M.D   On: 09/09/2018 14:58   Dg Chest Port 1 View  Result Date: 09/11/2018 CLINICAL DATA:  Bilateral pleural effusion EXAM: PORTABLE CHEST 1 VIEW COMPARISON:  2 days ago FINDINGS: Chronic cardiopericardial enlargement. Chronic pleural effusions that are likely moderate size. No superimposed Kerley lines or air bronchogram. Chronic metallic debris over the face and upper chest. IMPRESSION: Moderate layering pleural effusions. No significant change from prior. Electronically Signed   By: Marnee [place] M.D.   On: 09/11/2018 07:08    Cardiac Studies   Echo 09/08/18: IMPRESSIONS   1. The left ventricle has moderately reduced systolic function, with an ejection fraction of 35-40%. The cavity size was normal. There is severely increased left ventricular wall thickness. Left ventricular diastolic Doppler parameters are  indeterminate. Left ventricular diffuse hypokinesis.  2. The right ventricle has moderately reduced systolic function. The cavity was normal. There is mildly increased right ventricular wall thickness.  3. Left atrial size was moderately dilated.  4. Mitral valve regurgitation is moderate by color flow Doppler.  5. Tricuspid valve regurgitation is moderate.  6. Mild sclerosis of the aortic valve. Aortic valve regurgitation is trivial by color flow Doppler.  7. The inferior vena cava was normal in size with <50% respiratory variability.  8. Trivial pericardial effusion  primarly around the right atrium.  9. Moderate pleural effusion. 10. When compared to the prior study: The ejection fraction has decreased slightly. Mitral and tricuspid regurgitation have increase slightly by visual estimate.  Patient Profile     73 y.o. male with chronic systolic and diastolic heart failure, hypertension, hyperlipidemia, CAD s/p RCA PCI (2002), and hepatic steatosis here with acute on chronic systolic and diastolic heart failure and sepsis 2/2 cellulitis in the setting of medication non-compliance.    Assessment & Plan    # Acute on chronic systolic and diastolic heart failure: # Mixed cardiogenic/septic shock: Mr. Ostergard remains volume overloaded.  LVEF was slightly worse this admission at 35 to 40%.  His echo 11/2017 revealed LVEF 45 to 50%.  This is in the setting of volume overload and sepsis.  IVC was not dilated at the time of his echo this admission.  Although he does continue to have lower extremity edema, his JVP is not elevated.  He is net -757mL in the last 24 hours.  Creatinine improving with diuresis.  Continue IV lasix.  Will start midodrine 5mg  bid to help with diuresis.  Blood pressure is too low for beta-blockers or ACE-I/ARB at this time.  # CAD s/p PCI: Not an active issue.  Continue aspirin.  History of anaphylaxis on statins.  LDL 92.    # AKI:  Creatinine going in the right direction.  Continue diuresis as above.     For questions or updates, please contact [facility] Please consult [email or web address] for contact info under        Signed, Chilton Siiffany [place], MD  09/11/2018, 11:12 AM
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
## 2018-09-11 NOTE — Progress Notes (Signed)
IP rehab admissions - I met with patient at the bedside this am.  He is interested in inpatient rehab at [facility].  Once he is closer to being medically ready, we can contact his [employer] insurance to request inpatient rehab admission.  Patient aware we may then get a denial and he may need to consider SNF at that time.  I will follow for progress.  Call me for questions.  [phone]

## 2018-09-11 NOTE — Progress Notes (Addendum)
Occupational Therapy Treatment Patient Details Name: Stanley [name] MRN: [id number] DOB: 08/14/1945 Today's Date: 09/11/2018    History of present illness 73 y.o. male with medical history significant of CHF, HLD, CAD, PAD, hyperparathyroidism prediabetes, and hepatic steatosis. Presenting with SOB and blisters at BLEs   OT comments  Pt presenting towards OT goals, continues to have limitations due to fatigue and generalized weakness. Pt tolerated sitting EOB >10 min during this session. Pt requiring two person assist for bed mobility, able to maintain static sitting balance EOB with close minguard assist. Pt engaging in seated grooming ADL with setup assist, performing UB exercise for continue strengthening/endurance within pt tolerance - he continues to have limitations in RUE ROM due to R shoulder pain. Pt remains motivated to return to [facility] and appreciative of working with therapy. Feel POC remains appropriate at this time. Will continue to follow acutely to progress pt towards OT goals.   Follow Up Recommendations  CIR;Supervision/Assistance - 24 hour    Equipment Recommendations  Other (comment)(defer to next venue)          Precautions / Restrictions Precautions Precautions: Fall Restrictions Weight Bearing Restrictions: No       Mobility Bed Mobility Overal bed mobility: Needs Assistance Bed Mobility: Supine to Sit;Sit to Supine     Supine to sit: Mod assist;+2 for physical assistance;+2 for safety/equipment Sit to supine: Max assist;+2 for physical assistance;+2 for safety/equipment   General bed mobility comments: assist for LEs over EOB, to scoot hips and to elevate trunk; assist to guide trunk and LEs onto EOB end of session  Transfers                 General transfer comment: pt declining activity beyond EOB today, due to fatigue    Balance Overall balance assessment: Needs assistance Sitting-balance support: Feet supported;Single extremity  supported;No upper extremity supported Sitting balance-Leahy Scale: Fair Sitting balance - Comments: minguard for static sitting balance                                   ADL either performed or assessed with clinical judgement   ADL Overall ADL's : Needs assistance/impaired     Grooming: Wash/dry face;Set up;Sitting Grooming Details (indicate cue type and reason): sitting EOB                               General ADL Comments: pt with increased fatigue today and declined OOB to chair, agreeable to EOB activity; pt sat EOB >2min with close minguard for sitting balance; continues to have limitations due to pain, fatigue and weakness; limited due to RUE pain as well      Vision       Perception     Praxis      Cognition Arousal/Alertness: Awake/alert Behavior During Therapy: WFL for tasks assessed/performed Overall Cognitive Status: Within Functional Limits for tasks assessed                                          Exercises Exercises: General Upper Extremity;General Lower Extremity;Other exercises General Exercises - Upper Extremity Shoulder Flexion: AAROM;Left;10 reps Elbow Flexion: AROM;Right;10 reps Elbow Extension: AROM;Right;10 reps Digit Composite Flexion: AROM;Right;10 reps Composite Extension: AROM;Right;10 reps Other Exercises Other Exercises: lap slides RUE  within pain tolerance   Shoulder Instructions       General Comments RR up to mid-high 30's with activity, BP soft but stable; bil LEs and RUE edemous    Pertinent Vitals/ Pain       Pain Assessment: Faces Faces Pain Scale: Hurts even more Pain Location: bil LEs (L>R); R shoulder Pain Descriptors / Indicators: Discomfort;Grimacing;Sore Pain Intervention(s): Monitored during session;Limited activity within patient's tolerance;Repositioned  Home Living                                          Prior Functioning/Environment               Frequency  Min 2X/week        Progress Toward Goals  OT Goals(current goals can now be found in the care plan section)  Progress towards OT goals: Progressing toward goals  Acute Rehab OT Goals Patient Stated Goal: "Get stronger" OT Goal Formulation: With patient Time For Goal Achievement: 09/22/18 Potential to Achieve Goals: Good ADL Goals Pt Will Perform Grooming: with modified independence;standing Pt Will Perform Lower Body Dressing: with modified independence;sit to/from stand Pt Will Transfer to Toilet: with modified independence;ambulating;bedside commode Pt Will Perform Toileting - Clothing Manipulation and hygiene: with modified independence;sit to/from stand  Plan Discharge plan remains appropriate    Co-evaluation    PT/OT/SLP Co-Evaluation/Treatment: Yes Reason for Co-Treatment: For patient/therapist safety;To address functional/ADL transfers(pt with decreased activity tolerance)   OT goals addressed during session: Strengthening/ROM;ADL's and self-care      AM-PAC OT "6 Clicks" Daily Activity     Outcome Measure   Help from another person eating meals?: None Help from another person taking care of personal grooming?: A Little Help from another person toileting, which includes using toliet, bedpan, or urinal?: A Lot Help from another person bathing (including washing, rinsing, drying)?: A Lot Help from another person to put on and taking off regular upper body clothing?: A Little Help from another person to put on and taking off regular lower body clothing?: A Lot 6 Click Score: 16    End of Session    OT Visit Diagnosis: Unsteadiness on feet (R26.81);Other abnormalities of gait and mobility (R26.89);Muscle weakness (generalized) (M62.81);Pain Pain - Right/Left: Right Pain - part of body: Shoulder   Activity Tolerance Patient tolerated treatment well;Patient limited by fatigue   Patient Left in bed;with call bell/phone within reach   Nurse  Communication Mobility status        Time: 9480-1655 OT Time Calculation (min): 27 min  Charges: OT General Charges $OT Visit: 1 Visit OT Treatments $Self Care/Home Management : 8-22 mins  Marcy Siren, OT Supplemental Rehabilitation Services Pager [phone] Office [phone]    Orlando Penner 09/11/2018, 1:16 PM

## 2018-09-11 NOTE — Progress Notes (Signed)
Physical Therapy Treatment Patient Details Name: Stanley [name] MRN: [id number] DOB: 08-17-1945 Today's Date: 09/11/2018    History of Present Illness 73 y.o. male with medical history significant of CHF, HLD, CAD, PAD, hyperparathyroidism prediabetes, and hepatic steatosis. Presenting with SOB and blisters at BLEs    PT Comments    The patient encouraged to sit at bed edge and patient did not feel up to OOB. Patient required extensive assistance of 2 persons and much care taken for painful legs during mobility.   Follow Up Recommendations  CIR;SNF;Supervision/Assistance - 24 hour     Equipment Recommendations  None recommended by PT    Recommendations for Other Services Rehab consult     Precautions / Restrictions Precautions Precautions: Fall Precaution Comments: multiple  blisters on legs Restrictions Weight Bearing Restrictions: No    Mobility  Bed Mobility Overal bed mobility: Needs Assistance Bed Mobility: Supine to Sit;Sit to Supine     Supine to sit: Mod assist;+2 for physical assistance;+2 for safety/equipment Sit to supine: Max assist;+2 for physical assistance;+2 for safety/equipment   General bed mobility comments: assist for LEs over EOB, to scoot hips and to elevate trunk; assist to guide trunk and LEs onto EOB end of session  Transfers                 General transfer comment: pt declining activity beyond EOB today, due to fatigue  Ambulation/Gait                 Stairs             Wheelchair Mobility    Modified Rankin (Stroke Patients Only)       Balance Overall balance assessment: Needs assistance Sitting-balance support: Feet supported;Single extremity supported;No upper extremity supported Sitting balance-Leahy Scale: Fair Sitting balance - Comments: minguard for static sitting balance                                    Cognition Arousal/Alertness: Awake/alert Behavior During Therapy: WFL for  tasks assessed/performed Overall Cognitive Status: Within Functional Limits for tasks assessed                                        Exercises General Exercises - Upper Extremity Shoulder Flexion: AAROM;Left;10 reps Elbow Flexion: AROM;Right;10 reps Elbow Extension: AROM;Right;10 reps Digit Composite Flexion: AROM;Right;10 reps Composite Extension: AROM;Right;10 reps Other Exercises Other Exercises: lap slides RUE within pain tolerance    General Comments General comments (skin integrity, edema, etc.): RR up to mid-high 30's with activity, BP soft but stable; bil LEs and RUE edemous      Pertinent Vitals/Pain Pain Assessment: Faces Faces Pain Scale: Hurts even more Pain Location: bil LEs (L>R); R shoulder Pain Descriptors / Indicators: Discomfort;Grimacing;Sore Pain Intervention(s): Monitored during session    Home Living                      Prior Function            PT Goals (current goals can now be found in the care plan section) Acute Rehab PT Goals Patient Stated Goal: "Get stronger" Progress towards PT goals: Progressing toward goals    Frequency    Min 2X/week      PT Plan Frequency needs to be updated    Co-evaluation  PT/OT/SLP Co-Evaluation/Treatment: Yes Reason for Co-Treatment: For patient/therapist safety PT goals addressed during session: Mobility/safety with mobility OT goals addressed during session: Strengthening/ROM;ADL's and self-care      AM-PAC PT "6 Clicks" Mobility   Outcome Measure  Help needed turning from your back to your side while in a flat bed without using bedrails?: Total Help needed moving from lying on your back to sitting on the side of a flat bed without using bedrails?: Total Help needed moving to and from a bed to a chair (including a wheelchair)?: Total Help needed standing up from a chair using your arms (e.g., wheelchair or bedside chair)?: Total Help needed to walk in hospital room?:  Total Help needed climbing 3-5 steps with a railing? : Total 6 Click Score: 6    End of Session   Activity Tolerance: Patient tolerated treatment well;Patient limited by fatigue Patient left: in bed;with call bell/phone within reach;with bed alarm set Nurse Communication: Mobility status PT Visit Diagnosis: Unsteadiness on feet (R26.81)     Time: 3967-2897 PT Time Calculation (min) (ACUTE ONLY): 28 min  Charges:  $Therapeutic Activity: 8-22 mins                     Blanchard Kelch PT Acute Rehabilitation Services Pager [phone] Office [phone]    Rada Hay 09/11/2018, 1:42 PM

## 2018-09-12 ENCOUNTER — Inpatient Hospital Stay (HOSPITAL_COMMUNITY): Payer: Medicare Other

## 2018-09-12 LAB — CULTURE, BLOOD (ROUTINE X 2)
Culture: NO GROWTH
Culture: NO GROWTH
Special Requests: ADEQUATE

## 2018-09-12 LAB — CBC WITH DIFFERENTIAL/PLATELET
Abs Immature Granulocytes: 0.05 10*3/uL (ref 0.00–0.07)
Basophils Absolute: 0 10*3/uL (ref 0.0–0.1)
Basophils Relative: 0 %
Eosinophils Absolute: 0 10*3/uL (ref 0.0–0.5)
Eosinophils Relative: 0 %
HCT: 44.5 % (ref 39.0–52.0)
HEMOGLOBIN: 14.6 g/dL (ref 13.0–17.0)
Immature Granulocytes: 1 %
LYMPHS PCT: 4 %
Lymphs Abs: 0.3 10*3/uL — ABNORMAL LOW (ref 0.7–4.0)
MCH: 33.7 pg (ref 26.0–34.0)
MCHC: 32.8 g/dL (ref 30.0–36.0)
MCV: 102.8 fL — ABNORMAL HIGH (ref 80.0–100.0)
Monocytes Absolute: 1.4 10*3/uL — ABNORMAL HIGH (ref 0.1–1.0)
Monocytes Relative: 19 %
Neutro Abs: 5.6 10*3/uL (ref 1.7–7.7)
Neutrophils Relative %: 76 %
Platelets: 111 10*3/uL — ABNORMAL LOW (ref 150–400)
RBC: 4.33 MIL/uL (ref 4.22–5.81)
RDW: 13.8 % (ref 11.5–15.5)
WBC: 7.4 10*3/uL (ref 4.0–10.5)
nRBC: 0 % (ref 0.0–0.2)

## 2018-09-12 LAB — MAGNESIUM: MAGNESIUM: 1.6 mg/dL — AB (ref 1.7–2.4)

## 2018-09-12 LAB — COMPREHENSIVE METABOLIC PANEL
ALBUMIN: 2.3 g/dL — AB (ref 3.5–5.0)
ALT: 31 U/L (ref 0–44)
AST: 35 U/L (ref 15–41)
Alkaline Phosphatase: 110 U/L (ref 38–126)
Anion gap: 11 (ref 5–15)
BUN: 36 mg/dL — AB (ref 8–23)
CO2: 24 mmol/L (ref 22–32)
Calcium: 9.4 mg/dL (ref 8.9–10.3)
Chloride: 102 mmol/L (ref 98–111)
Creatinine, Ser: 1.09 mg/dL (ref 0.61–1.24)
GFR calc Af Amer: >60 mL/min (ref >60)
GFR calc non Af Amer: >60 mL/min (ref >60)
GLUCOSE: 107 mg/dL — AB (ref 70–99)
Potassium: 3.3 mmol/L — ABNORMAL LOW (ref 3.5–5.1)
Sodium: 137 mmol/L (ref 135–145)
Total Bilirubin: 6.7 mg/dL — ABNORMAL HIGH (ref 0.3–1.2)
Total Protein: 6.3 g/dL — ABNORMAL LOW (ref 6.5–8.1)

## 2018-09-12 LAB — PHOSPHORUS: Phosphorus: 2.1 mg/dL — ABNORMAL LOW (ref 2.5–4.6)

## 2018-09-12 MED ORDER — PHENOL 1.4 % MT LIQD
1.0000 | OROMUCOSAL | Status: DC | PRN
  Filled 2018-09-12: qty 177

## 2018-09-12 MED ORDER — POTASSIUM PHOSPHATE MONOBASIC 500 MG PO TABS
500.0000 mg | ORAL_TABLET | Freq: Three times a day (TID) | ORAL | Status: AC
  Administered 2018-09-12 – 2018-09-13 (×3): 500 mg via ORAL
  Filled 2018-09-12 (×5): qty 1

## 2018-09-12 MED ORDER — MAGNESIUM SULFATE 2 GM/50ML IV SOLN
2.0000 g | Freq: Once | INTRAVENOUS | Status: AC
  Administered 2018-09-12: 2 g via INTRAVENOUS
  Filled 2018-09-12: qty 50

## 2018-09-12 MED ORDER — FUROSEMIDE 10 MG/ML IJ SOLN
40.0000 mg | Freq: Three times a day (TID) | INTRAMUSCULAR | Status: DC
  Administered 2018-09-12 – 2018-09-15 (×8): 40 mg via INTRAVENOUS
  Filled 2018-09-12 (×9): qty 4

## 2018-09-12 NOTE — Progress Notes (Signed)
Pt transferred to room 1403, oriented to room and floor. Pt A&O, denied pain. Requested BLE dressings not be removed for assessment at this time- dressings were just changed prior to transfer and are C, D & I.
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
## 2018-09-12 NOTE — Progress Notes (Signed)
IP rehab admissions - Noted PT/OT recommendations now are for SNF placement.  If patient improves, can revisit potential for inpatient rehab admission.  Based on therapies today, he likely will need SNF placement at time of discharge.  Call me for questions.  [phone]

## 2018-09-12 NOTE — Progress Notes (Signed)
Physical Therapy Treatment Patient Details Name: Stanley [name] MRN: [id number] DOB: [birth year] Today's Date: 09/12/2018    History of Present Illness 73 y.o. male with medical history significant of CHF, HLD, CAD, PAD, hyperparathyroidism prediabetes, and hepatic steatosis. Presenting with SOB and blisters at BLEs    PT Comments    The patient  Demonstrated improved bed mobility and reports feeling better. Patient very energetic and talkative.  Attempted to stand x 2 at [facility] but unable to safely   Follow Up Recommendations  SNF     Equipment Recommendations  None recommended by PT    Recommendations for Other Services       Precautions / Restrictions Precautions Precautions: Fall Precaution Comments: multiple  blisters on legs, SOB, Sats OK    Mobility  Bed Mobility Overal bed mobility: Needs Assistance Bed Mobility: Supine to Sit     Supine to sit: Max assist;+2 for physical assistance;+2 for safety/equipment     General bed mobility comments: assist for LEs over EOB, to scoot hips and to elevate trunk; Patient did self assist more today.  Transfers Overall transfer level: Needs assistance Equipment used: Rolling walker (2 wheeled);None Transfers: Sit to/from Stand;Lateral/Scoot Transfers Sit to Stand: Max assist;+2 physical assistance;+2 safety/equipment        Lateral/Scoot Transfers: Max assist;+2 physical assistance;+2 safety/equipment General transfer comment: attempted x 2 to stand from bed at RW, right foot sliding forward. Resorted to scooting with bed  pad to recliner with multiple scoots with 2 max assist  Ambulation/Gait                 Stairs             Wheelchair Mobility    Modified Rankin (Stroke Patients Only)       Balance   Sitting-balance support: Feet supported;Single extremity supported Sitting balance-Leahy Scale: Fair Sitting balance - Comments: minguard for static sitting balance                                    Cognition Arousal/Alertness: Awake/alert Behavior During Therapy: WFL for tasks assessed/performed Overall Cognitive Status: Within Functional Limits for tasks assessed                                        Exercises      General Comments        Pertinent Vitals/Pain Faces Pain Scale: Hurts even more Pain Location: bil LEs (L knee>R); R shoulder Pain Descriptors / Indicators: Discomfort;Grimacing;Sore Pain Intervention(s): Monitored during session    Home Living                      Prior Function            PT Goals (current goals can now be found in the care plan section) Progress towards PT goals: Progressing toward goals    Frequency    Min 2X/week      PT Plan Frequency needs to be updated;Discharge plan needs to be updated    Co-evaluation PT/OT/SLP Co-Evaluation/Treatment: Yes Reason for Co-Treatment: Complexity of the patient's impairments (multi-system involvement);For patient/therapist safety PT goals addressed during session: Mobility/safety with mobility OT goals addressed during session: ADL's and self-care;Strengthening/ROM      AM-PAC PT "6 Clicks" Mobility   Outcome Measure  Help needed turning  from your back to your side while in a flat bed without using bedrails?: A Lot Help needed moving from lying on your back to sitting on the side of a flat bed without using bedrails?: Total Help needed moving to and from a bed to a chair (including a wheelchair)?: Total Help needed standing up from a chair using your arms (e.g., wheelchair or bedside chair)?: Total Help needed to walk in hospital room?: Total Help needed climbing 3-5 steps with a railing? : Total 6 Click Score: 7    End of Session   Activity Tolerance: Patient tolerated treatment well;Patient limited by fatigue Patient left: in bed;with call bell/phone within reach;with bed alarm set Nurse Communication: Mobility status PT Visit  Diagnosis: Unsteadiness on feet (R26.81)     Time: 8003-4917 PT Time Calculation (min) (ACUTE ONLY): 38 min  Charges:  $Therapeutic Activity: 8-22 mins                     Blanchard Kelch PT Acute Rehabilitation Services Pager [phone] Office [phone]    Rada Hay 09/12/2018, 1:23 PM

## 2018-09-12 NOTE — Progress Notes (Signed)
Occupational Therapy Treatment and goal downgrade Patient Details Name: Stanley [name] MRN: [id number] DOB: Apr 01, 1946 Today's Date: 09/12/2018    History of present illness 73 y.o. male with medical history significant of CHF, HLD, CAD, PAD, hyperparathyroidism prediabetes, and hepatic steatosis. Presenting with SOB and blisters at BLEs   OT comments  Pt very willing to work with therapy. Sat EOB and performed lap slides. Attempted to stand x 2, but unable with +2 assistance.  Lateral scoot to drop arm recliner with total +2 assistance. Skypad placed under him in chair for back to bed by nursing  Follow Up Recommendations    SNF   Equipment Recommendations  None recommended by OT    Recommendations for Other Services      Precautions / Restrictions Precautions Precautions: Fall Precaution Comments: multiple  blisters on legs, SOB, Sats OK Restrictions Weight Bearing Restrictions: No       Mobility Bed Mobility Overal bed mobility: Needs Assistance Bed Mobility: Supine to Sit     Supine to sit: Mod assist;+2 for physical assistance;+2 for safety/equipment(Simultaneous filing. User may not have seen previous data.)     General bed mobility comments: assist for LEs over EOB, to scoot hips and to elevate trunk; Patient did self assist more today.  Transfers Overall transfer level: Needs assistance Equipment used: Rolling walker (2 wheeled);None Transfers: Sit to/from Stand;Lateral/Scoot Transfers         Lateral/Scoot Transfers: Total assist;+2 physical assistance;+2 safety/equipment General transfer comment: attempted to stand with +2 assistance from high surface and feet blocked; pt too weak. total +2 lateral scoot to chair(Simultaneous filing. User may not have seen previous data.)    Balance   Sitting-balance support: Feet supported;Single extremity supported Sitting balance-Leahy Scale: Fair Sitting balance - Comments: minguard for static sitting balance                                    ADL either performed or assessed with clinical judgement   ADL   Eating/Feeding: Set up;Sitting Eating/Feeding Details (indicate cue type and reason): pt is using LUE for beverages.  States he has been able to self feed with RUE                     Toilet Transfer: Total assistance;+2 for physical assistance(lateral scoot to chair)                   Vision       Perception     Praxis      Cognition Arousal/Alertness: Awake/alert Behavior During Therapy: WFL for tasks assessed/performed Overall Cognitive Status: Within Functional Limits for tasks assessed                                          Exercises Other Exercises Other Exercises: lap slides RUE in sitting   Shoulder Instructions       General Comments      Pertinent Vitals/ Pain       Faces Pain Scale: Hurts even more Pain Location: bil LEs (L knee>R); R shoulder Pain Descriptors / Indicators: Discomfort;Grimacing;Sore Pain Intervention(s): Monitored during session  Home Living  Prior Functioning/Environment              Frequency  Min 2X/week        Progress Toward Goals  OT Goals(current goals can now be found in the care plan section)  Progress towards OT goals: Not progressing toward goals - comment;Goals drowngraded-see care plan  ADL Goals Pt Will Perform Grooming: (discontinue) Pt Will Perform Upper Body Dressing: with set-up;sitting Pt Will Perform Lower Body Dressing: (discontinue) Pt Will Transfer to Toilet: with max assist;with +2 assist;with transfer board;bedside commode Pt Will Perform Toileting - Clothing Manipulation and hygiene: with max assist;sitting/lateral leans Additional ADL Goal #1: pt will perform bed mobility with min +2 assist in preparation for adls/toilet transfers  Plan      Co-evaluation      Reason for Co-Treatment:  Complexity of the patient's impairments (multi-system involvement);For patient/therapist safety PT goals addressed during session: Mobility/safety with mobility OT goals addressed during session: ADL's and self-care;Strengthening/ROM      AM-PAC OT "6 Clicks" Daily Activity     Outcome Measure   Help from another person eating meals?: A Little Help from another person taking care of personal grooming?: A Little Help from another person toileting, which includes using toliet, bedpan, or urinal?: Total Help from another person bathing (including washing, rinsing, drying)?: A Lot Help from another person to put on and taking off regular upper body clothing?: A Little Help from another person to put on and taking off regular lower body clothing?: Total 6 Click Score: 13    End of Session    OT Visit Diagnosis: Unsteadiness on feet (R26.81);Other abnormalities of gait and mobility (R26.89);Muscle weakness (generalized) (M62.81);Pain Pain - Right/Left: Right Pain - part of body: Shoulder   Activity Tolerance Patient tolerated treatment well   Patient Left in chair;with call bell/phone within reach;with chair alarm set   Nurse Communication Need for lift equipment(maxisky in chair)        Time: 6295-2841 OT Time Calculation (min): 40 min  Charges: OT General Charges $OT Visit: 1 Visit OT Treatments $Therapeutic Activity: 8-22 mins  Marica Otter, OTR/L Acute Rehabilitation Services [phone] [facility] pager [phone] office 09/12/2018   Sinia Antosh 09/12/2018, 1:30 PM

## 2018-09-12 NOTE — Progress Notes (Signed)
Progress Note  Patient Name: Stanley [name] Date of Encounter: 09/12/2018  Primary Cardiologist: [name], MD   Subjective   Pt is breathing well and reports improvement in his lower extremity edema. He is very concerned about missing a scheduled doctor's appt today and his cholesterol injection (in a trial).  Inpatient Medications    Scheduled Meds: . aspirin EC  81 mg Oral Daily  . Chlorhexidine Gluconate Cloth  6 each Topical Daily  . enoxaparin (LOVENOX) injection  40 mg Subcutaneous Q24H  . feeding supplement (ENSURE ENLIVE)  237 mL Oral BID BM  . feeding supplement (PRO-STAT SUGAR FREE 64)  30 mL Oral BID  . furosemide  40 mg Intravenous BID  . guaiFENesin  600 mg Oral BID  . latanoprost  1 drop Both Eyes QHS  . mouth rinse  15 mL Mouth Rinse BID  . midodrine  5 mg Oral BID WC  . multivitamin with minerals  1 tablet Oral Daily  . mupirocin cream   Topical Daily  . pantoprazole  80 mg Oral Daily  . sodium chloride flush  3 mL Intravenous Q12H   Continuous Infusions: . sodium chloride    . sodium chloride    . cefTRIAXone (ROCEPHIN)  IV Stopped (09/11/18 2158)  . vancomycin Stopped (09/11/18 1912)   PRN Meds: sodium chloride, [CANCELED] Place/Maintain arterial line **AND** sodium chloride, acetaminophen **OR** acetaminophen, colchicine, levalbuterol, phenol, sodium chloride flush   Vital Signs    Vitals:   09/12/18 0500 09/12/18 0631 09/12/18 0700 09/12/18 0800  BP:  103/73 116/76 113/77  Pulse:      Resp: (!) 27 (!) 23 (!) 25 (!) 32  Temp:      TempSrc:      SpO2:   100% 100%  Weight: 83.3 kg     Height:        Intake/Output Summary (Last 24 hours) at 09/12/2018 1610 Last data filed at 09/12/2018 0600 Gross per 24 hour  Intake 823.52 ml  Output 2550 ml  Net -1726.48 ml   Last 3 Weights 09/12/2018 09/11/2018 09/09/2018  Weight (lbs) 183 lb 10.3 oz 182 lb 5.1 oz 179 lb 10.8 oz  Weight (kg) 83.3 kg 82.7 kg 81.5 kg      Telemetry    Sinus, but  ectopy and pauses - Personally Reviewed  ECG    No new tracings - Personally Reviewed  Physical Exam   GEN: elderly male in no acute distress.   Neck: No JVD Cardiac: RRR, no murmurs, rubs, or gallops.  Respiratory: Clear to auscultation bilaterally diminished in bases GI: Soft, nontender, non-distended  MS: + B LE edema L > R, tender to touch Neuro:  Nonfocal  Psych: Normal affect   Labs    Chemistry Recent Labs  Lab 09/10/18 0347 09/11/18 0537 09/12/18 0235  NA 137 137 137  K 3.8 3.2* 3.3*  CL 104 104 102  CO2 GLUCOSE 119* 94 107*  BUN 38* 36* 36*  CREATININE 1.28* 1.20 1.09  CALCIUM 9.4 9.0 9.4  PROT 6.3* 5.7* 6.3*  ALBUMIN 2.5* 2.3* 2.3*  AST 41 33 35  ALT 36 30 31  ALKPHOS 116 103 110  BILITOT 5.8* 5.8* 6.7*  GFRNONAA 56* >60 >60  GFRAA >60 >60 >60  ANIONGAP Hematology Recent Labs  Lab 09/09/18 1549 09/11/18 0924 09/12/18 0235  WBC 8.6 7.8 7.4  RBC 4.81 4.18* 4.33  HGB 15.9 14.1 14.6  HCT 50.5 43.2 44.5  MCV 105.0* 103.3* 102.8*  MCH 33.1 33.7 33.7  MCHC 31.5 32.6 32.8  RDW 14.3 14.0 13.8  PLT 119* 96* 111*    Cardiac Enzymes Recent Labs  Lab 09/07/18 1935 09/08/18 0108 09/08/18 0704  TROPONINI 0.11* 0.19* 0.18*    Recent Labs  Lab 09/07/18 1615  TROPIPOC 0.28*     BNP Recent Labs  Lab 09/07/18 1528 09/08/18 1124  BNP 1,691.4* 1,619.6*     DDimer No results for input(s): DDIMER in the last 168 hours.   Radiology    Dg Chest Port 1 View  Result Date: 09/12/2018 CLINICAL DATA:  Shortness of breath EXAM: PORTABLE CHEST 1 VIEW COMPARISON:  September 11, 2018 FINDINGS: There is persistent cardiomegaly with pulmonary vascularity normal. Patient is status post median sternotomy. There are pleural effusions bilaterally with bibasilar atelectasis. No new opacities are evident. There are metallic fragments along the midline of the thorax. Postoperative changes noted in the right mandible region. IMPRESSION: Stable  cardiomegaly with pleural effusions bilaterally. Bibasilar atelectasis. No new opacity. Electronically Signed   By: Bretta Bang III M.D.   On: 09/12/2018 07:03   Dg Chest Port 1 View  Result Date: 09/11/2018 CLINICAL DATA:  Bilateral pleural effusion EXAM: PORTABLE CHEST 1 VIEW COMPARISON:  2 days ago FINDINGS: Chronic cardiopericardial enlargement. Chronic pleural effusions that are likely moderate size. No superimposed Kerley lines or air bronchogram. Chronic metallic debris over the face and upper chest. IMPRESSION: Moderate layering pleural effusions. No significant change from prior. Electronically Signed   By: Marnee Spring M.D.   On: 09/11/2018 07:08    Cardiac Studies   Echo 09/08/18: IMPRESSIONS  1. The left ventricle has moderately reduced systolic function, with an ejection fraction of 35-40%. The cavity size was normal. There is severely increased left ventricular wall thickness. Left ventricular diastolic Doppler parameters are  indeterminate. Left ventricular diffuse hypokinesis. 2. The right ventricle has moderately reduced systolic function. The cavity was normal. There is mildly increased right ventricular wall thickness. 3. Left atrial size was moderately dilated. 4. Mitral valve regurgitation is moderate by color flow Doppler. 5. Tricuspid valve regurgitation is moderate. 6. Mild sclerosis of the aortic valve. Aortic valve regurgitation is trivial by color flow Doppler. 7. The inferior vena cava was normal in size with <50% respiratory variability. 8. Trivial pericardial effusion primarly around the right atrium. 9. Moderate pleural effusion. 10. When compared to the prior study: The ejection fraction has decreased slightly. Mitral and tricuspid regurgitation have increase slightly by visual estimate.  Patient Profile     73 y.o. male  with chronic systolic and diastolic heart failure, hypertension, hyperlipidemia, CAD s/p RCA PCI (2002), and hepatic  steatosis here with acute on chronic systolic and diastolic heart failure and sepsis 2/2 cellulitis in the setting of medication non-compliance.    Assessment & Plan    1. Acute on chronic combined systolic and diastolic heart failure 2. Mixed cardiogenic/septic shock 3. Hypotension  4. AKI - EF this admission reduced from 45-50% in 11/2017 to 35-40% - he is currently diuresing on lasix 40 mg IV BID - he is overall net negative 2L with 2.5 L urine output yesterday - sCr 1.09, K 3.3 - baseline creatinine appears to be 1.2-1.4 - has started on midodrine for pressure support while diuresing - still with significant edema, continue IV lasix today and follow renal function   5. CAD s/p PCI 6. Hyperlipidemia - denies anginal symptoms - no statin for  anaphylaxix - 09/08/2018: Cholesterol 135; HDL 35; LDL Cholesterol 92; Triglycerides 41; VLDL 8 - continue ASA - I have contacted Dr. Rennis Golden about patient's cholesterol trial (he is missing his scheduled injection today due to being hospitalized)      For questions or updates, please contact [facility] Please consult [email or web address] for contact info under        Signed, Marcelino Duster, PA  09/12/2018, 8:08 AM

## 2018-09-13 LAB — COMPREHENSIVE METABOLIC PANEL
ALT: 31 U/L (ref 0–44)
AST: 41 U/L (ref 15–41)
Albumin: 2.2 g/dL — ABNORMAL LOW (ref 3.5–5.0)
Alkaline Phosphatase: 126 U/L (ref 38–126)
Anion gap: 12 (ref 5–15)
BUN: 39 mg/dL — ABNORMAL HIGH (ref 8–23)
CHLORIDE: 98 mmol/L (ref 98–111)
CO2: 28 mmol/L (ref 22–32)
Calcium: 9.5 mg/dL (ref 8.9–10.3)
Creatinine, Ser: 1.03 mg/dL (ref 0.61–1.24)
GFR calc Af Amer: >60 mL/min (ref >60)
GFR calc non Af Amer: >60 mL/min (ref >60)
GLUCOSE: 104 mg/dL — AB (ref 70–99)
Potassium: 2.9 mmol/L — ABNORMAL LOW (ref 3.5–5.1)
SODIUM: 138 mmol/L (ref 135–145)
Total Bilirubin: 6.2 mg/dL — ABNORMAL HIGH (ref 0.3–1.2)
Total Protein: 6.5 g/dL (ref 6.5–8.1)

## 2018-09-13 LAB — MAGNESIUM: Magnesium: 1.7 mg/dL (ref 1.7–2.4)

## 2018-09-13 LAB — POTASSIUM: Potassium: 4.2 mmol/L (ref 3.5–5.1)

## 2018-09-13 MED ORDER — MAGNESIUM SULFATE IN D5W 1-5 GM/100ML-% IV SOLN
1.0000 g | Freq: Once | INTRAVENOUS | Status: AC
  Administered 2018-09-13: 1 g via INTRAVENOUS
  Filled 2018-09-13: qty 100

## 2018-09-13 MED ORDER — CEFDINIR 300 MG PO CAPS
300.0000 mg | ORAL_CAPSULE | Freq: Two times a day (BID) | ORAL | Status: AC
  Administered 2018-09-13 – 2018-09-14 (×2): 300 mg via ORAL
  Filled 2018-09-13 (×2): qty 1

## 2018-09-13 MED ORDER — POTASSIUM CHLORIDE CRYS ER 20 MEQ PO TBCR
30.0000 meq | EXTENDED_RELEASE_TABLET | ORAL | Status: AC
  Administered 2018-09-13 (×2): 30 meq via ORAL
  Filled 2018-09-13 (×2): qty 1

## 2018-09-13 NOTE — TOC Transition Note (Signed)
Transition of Care [facility]) - CM/SW Discharge Note   Patient Details  Name: Stanley [name] MRN: [id number] Date of Birth: 1945/11/10  [facility] CM/SW Contact:  Lanier Clam, RN Phone Number: 09/13/2018, 3:54 PM   Clinical Narrative:   Patient has accepted a bed @ [name]-CM has connected patient with the liason @ [name] Haze [place] will start auth,& update CM. Patient agreed to his friend Sharon([phone] or [phone] to be contacted by [name] for signing in & paperwork.    Final next level of care: Skilled Nursing Facility Barriers to Discharge: No Barriers Identified   Patient Goals and CMS Choice Patient states their goals for this hospitalization and ongoing recovery are:: (get stronger) CMS Medicare.gov Compare Post Acute Care list provided to:: Patient Choice offered to / list presented to : Patient  Discharge Placement                       Discharge Plan and Services   Discharge Planning Services: CM Consult Post Acute Care Choice: Durable Medical Equipment(rw)                    Social Determinants of Health (SDOH) Interventions     Readmission Risk Interventions No flowsheet data found.

## 2018-09-13 NOTE — Progress Notes (Signed)
Offered to change patients dressings. Patient refused and requested for them to be changed later this morning so he could go back to sleep.

## 2018-09-13 NOTE — Progress Notes (Signed)
Nutrition Follow-up  DOCUMENTATION CODES:   Non-severe (moderate) malnutrition in context of chronic illness  INTERVENTION:  - Continue Ensure Enlive BID and Prostat BID. - Continue to encourage PO intakes.    NUTRITION DIAGNOSIS:   Moderate Malnutrition related to chronic illness as evidenced by mild fat depletion, moderate fat depletion, mild muscle depletion, moderate muscle depletion. -ongoing  GOAL:   Patient will meet greater than or equal to 90% of their needs -unmet the past few days.  MONITOR:   PO intake, Supplement acceptance, Weight trends, Labs, I & O's  ASSESSMENT:   73 y.o. male with medical history significant of CHF, HLD, CAD, PAD, hyperparathyroidism, prediabetes, and hepatic steatosis. He presented to the ED with SOB, diffuse fatigue, and lower extremity swelling up to the waist. Symptoms started about 2.5 weeks ago. SOB has gotten progressively worse. He usually takes diuretics but stopped taking them because it was making him urinate too much and he was getting too weak to get to the bathroom. His leg edema has been chronic but getting much worse now. He reports having no appetite and hardly eating which has caused him to lose weight.  Weight has been relatively stable throughout admission. Patient was initially eating 100% of meals, but has been eating 25-50% of meals over the past 4 days. Patient confirms this and states that his appetite had been good right before admission and at the beginning of admission but now he feels like he gets full quickly/after just a few bites. He denies abdominal pain or pressure or nausea. He prefers liquids over solid foods usually as they do not make him feel as full as quickly. He likes to drink water, iced tea, Coke, sprite, and Ensure once/day.  Patient is currently ordered Ensure BID and has been accepting it ~50% of the time offered. He is also ordered Prostat BID and has been accepting this ~75% of the time offered.    Likely plan for SNF at time of d/c, per review of notes.    Medications reviewed; 40 mg IV lasix TID, 1 g IV Mg sulfate x1 run 3/19, daily multivitamin with minerals, 80 mg oral protonix/day, 30 mEq K-Dur x2 doses 3/19, 500 mg monobasic x3 doses 3/18.  Labs reviewed; K: 2.9 mmol/l, BUN: 39 mg/dl.      Diet Order:   Diet Order            Diet 2 gram sodium Room service appropriate? Yes; Fluid consistency: Thin  Diet effective now              EDUCATION NEEDS:   No education needs have been identified at this time  Skin:  Skin Assessment: Reviewed RN Assessment  Last BM:  3/19  Height:   Ht Readings from Last 1 Encounters:  09/09/18 5\' 6"  (1.676 m)    Weight:   Wt Readings from Last 1 Encounters:  09/13/18 83.5 kg    Ideal Body Weight:  64.54 kg  BMI:  Body mass index is 29.71 kg/m.  Estimated Nutritional Needs:   Kcal:  1810-2060 kcal  Protein:  85-100 grams  Fluid:  >/= 1.5 L/day     Trenton Gammon, MS, RD, LDN, [facility] Inpatient Clinical Dietitian Pager # [phone] After hours/weekend pager # [phone]

## 2018-09-13 NOTE — TOC Initial Note (Signed)
Transition of Care [facility]) - Initial/Assessment Note    Patient Details  Name: Stanley [name] MRN: [id number] Date of Birth: 1945/08/05  Transition of Care [facility]) CM/SW Contact:    Lanier Clam, RN Phone Number: 09/13/2018, 2:27 PM  Clinical Narrative:  Spoke to patient about d/c plans he plans to d/c to SNF-informed of PT/OT recc-he is in agreement-patient wanted his friend, & dtr to also be informed of his d/c plans-TC dtr Lauren c#[phone]-left vm w/call back#,friend-Sharon h#[phone]-on speaker phone per patient request-informed of patient agreement to SNF & sending out info to facilities.Await acceptance from SNF.                 Expected Discharge Plan: Skilled Nursing Facility Barriers to Discharge: No Barriers Identified   Patient Goals and CMS Choice Patient states their goals for this hospitalization and ongoing recovery are:: (get stronger) CMS Medicare.gov Compare Post Acute Care list provided to:: Patient Choice offered to / list presented to : Patient  Expected Discharge Plan and Services Expected Discharge Plan: Skilled Nursing Facility   Discharge Planning Services: CM Consult Post Acute Care Choice: Durable Medical Equipment(rw) Living arrangements for the past 2 months: Single Family Home Expected Discharge Date: (unknown)                        Prior Living Arrangements/Services Living arrangements for the past 2 months: Single Family Home Lives with:: Friends Patient language and need for interpreter reviewed:: Yes Do you feel safe going back to the place where you live?: Yes      Need for Family Participation in Patient Care: No (Comment) Care giver support system in place?: Yes (comment) Current home services: DME(rw) Criminal Activity/Legal Involvement Pertinent to Current Situation/Hospitalization: No - Comment as needed  Activities of Daily Living Home Assistive Devices/Equipment: Eyeglasses, Dan Humphreys (specify type) ADL Screening (condition  at time of admission) Patient's cognitive ability adequate to safely complete daily activities?: Yes Is the patient deaf or have difficulty hearing?: No Does the patient have difficulty seeing, even when wearing glasses/contacts?: No Does the patient have difficulty concentrating, remembering, or making decisions?: No Patient able to express need for assistance with ADLs?: Yes Does the patient have difficulty dressing or bathing?: Yes Independently performs ADLs?: No Communication: Independent Dressing (OT): Needs assistance Is this a change from baseline?: Pre-admission baseline Grooming: Independent Feeding: Independent Bathing: Needs assistance Is this a change from baseline?: Pre-admission baseline Toileting: Needs assistance Is this a change from baseline?: Pre-admission baseline In/Out Bed: Needs assistance Is this a change from baseline?: Pre-admission baseline Walks in Home: Dependent Is this a change from baseline?: Pre-admission baseline Does the patient have difficulty walking or climbing stairs?: Yes Weakness of Legs: Both Weakness of Arms/Hands: Both  Permission Sought/Granted Permission sought to share information with : Case Manager Permission granted to share information with : Yes, Verbal Permission Granted  Share Information with NAME: Jasmine December Cameron;daughter Autumn Patty)  Permission granted to share info w AGENCY: (All SNF)  Permission granted to share info w Relationship: (friend/daughter)  Permission granted to share info w Contact Information: (Sharon-[phone] or c#[phone];Lauren-[phone])  Emotional Assessment Appearance:: Appears stated age, Well-Groomed Attitude/Demeanor/Rapport: Gracious Affect (typically observed): Accepting Orientation: : Oriented to Self, Oriented to Place, Oriented to  Time, Oriented to Situation Alcohol / Substance Use: Never Used Psych Involvement: No (comment)  Admission diagnosis:  Shortness of breath  [R06.02] Elevated LFTs [R94.5] Generalized weakness [Z66.1]  Pain in both lower extremities [M79.604, M79.605] CHF exacerbation (HCC) [I50.9] Patient Active Problem List   Diagnosis Date Noted  . Lactic acidosis 09/08/2018  . Anasarca 09/07/2018  . Elevated LFTs 09/07/2018  . CHF exacerbation (HCC) 09/07/2018  . Acute on chronic combined systolic and diastolic CHF (congestive heart failure) (HCC) 09/07/2018  . Prolonged QT interval 09/07/2018  . Cellulitis 09/07/2018  . Malnutrition of moderate degree 07/04/2018  . Ventral hernia with bowel obstruction 07/03/2018  . Incarcerated epigastric hernia 07/02/2018  . SBO (small bowel obstruction) (HCC) 07/02/2018  . CKD (chronic kidney disease) stage 3, GFR 30-59 ml/min (HCC) 07/02/2018  . Essential hypertension   . Coronary artery disease due to lipid rich plaque   . Peripheral arterial disease (HCC) 10/26/2017  . Morbid obesity due to excess calories (HCC) 01/25/2017  . Upper airway cough syndrome 01/24/2017  . CAD (coronary artery disease), native coronary artery 03/21/2016  . Chronic combined systolic and diastolic CHF (congestive heart failure) (HCC)   . Dyspnea on exertion   . Thrombocytopenia (HCC)   . Elevated troponin 12/05/2015  . Hyperlipidemia   . Essential hypertension, benign 10/16/2013  . Encounter for long-term (current) use of other medications 10/16/2013  . Nevus, non-neoplastic 07/25/2012  . Varicose veins of lower extremities with other complications 06/25/2012  . Leg pain, bilateral 06/25/2012  . Swelling of limb 03/12/2012  . Cholelithiasis 02/10/2011   PCP:  [name], MD Pharmacy:   [facility] [phone] [place], [place] - [phone] AT [facility] [address] [place] [address] Phone: [phone] Fax: [phone]  [facility] Sites - [place], [place] - [address] [place] [address] Phone: [phone] Fax:  [phone]  [facility] SERV[facility] - [place], [place] - [address] [facility] [address] [place] [address] [place] [address] Phone: [phone] Fax: [phone]  [facility] [phone] [facility] [place], [place] - [address] [place] [address] Phone: [phone] Fax: [phone]     Social Determinants of Health (SDOH) Interventions    Readmission Risk Interventions No flowsheet data found.

## 2018-09-13 NOTE — TOC Transition Note (Signed)
Transition of Care [facility]) - CM/SW Discharge Note   Patient Details  Name: Stanley [name] MRN: [id number] Date of Birth: 1945-08-07  Transition [facility] Care [facility]) CM/SW Contact:  Lanier Clam, RN Phone Number: 09/13/2018, 3:01 PM   Clinical Narrative:PT recc SNF. Spoke to patient about d/c plans-he agrees to SNF-he requested CM contact his friend-Sharon-she also agrees to SNF, left vm w/patient's dtr Lauren to contact CM-await call back. Patient has been faxed out,awaiting acceptance.Continue to monitor for d/c plans.        Final next level of care: Skilled Nursing Facility Barriers to Discharge: No Barriers Identified   Patient Goals and CMS Choice Patient states their goals for this hospitalization and ongoing recovery are:: (get stronger) CMS Medicare.gov Compare Post Acute Care list provided to:: Patient Choice offered to / list presented to : Patient  Discharge Placement                       Discharge Plan and Services   Discharge Planning Services: CM Consult Post Acute Care Choice: Durable Medical Equipment(rw)                    Social Determinants of Health (SDOH) Interventions     Readmission Risk Interventions No flowsheet data found.

## 2018-09-13 NOTE — Plan of Care (Signed)
  Problem: Health Behavior/Discharge Planning: Goal: Ability to manage health-related needs will improve Outcome: Progressing   Problem: Clinical Measurements: Goal: Ability to maintain clinical measurements within normal limits will improve Outcome: Progressing Goal: Diagnostic test results will improve Outcome: Progressing   Problem: Nutrition: Goal: Adequate nutrition will be maintained Outcome: Progressing   Problem: Activity: Goal: Risk for activity intolerance will decrease Outcome: Not Progressing   Problem: Clinical Measurements: Goal: Will remain free from infection Outcome: Adequate for Discharge Goal: Respiratory complications will improve Outcome: Adequate for Discharge Goal: Cardiovascular complication will be avoided Outcome: Adequate for Discharge   Problem: Coping: Goal: Level of anxiety will decrease Outcome: Adequate for Discharge   Problem: Elimination: Goal: Will not experience complications related to bowel motility Outcome: Adequate for Discharge Goal: Will not experience complications related to urinary retention Outcome: Adequate for Discharge   Problem: Pain Managment: Goal: General experience of comfort will improve Outcome: Adequate for Discharge   Problem: Safety: Goal: Ability to remain free from injury will improve Outcome: Adequate for Discharge   Problem: Skin Integrity: Goal: Risk for impaired skin integrity will decrease Outcome: Adequate for Discharge
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
## 2018-09-13 NOTE — NC FL2 (Signed)
[place] MEDICAID FL2 LEVEL OF CARE SCREENING TOOL     IDENTIFICATION  Patient Name: Stanley [name] Birthdate: 03/21/1946 Sex: male Admission Date (Current Location): 09/07/2018  County and Medicaid Number:  [place]   Facility and Address:  [facility],  [address], [place] [address]      Provider Number: [id number]  Attending Physician Name and Address:  Nettey, Ralph A, MD  Relative Name and Phone Number:  (Sharon Cameron [phone])    Current Level of Care: Hospital Recommended Level of Care: Skilled Nursing Facility Prior Approval Number:    Date Approved/Denied:   PASRR Number: [id number]  Discharge Plan: SNF    Current Diagnoses: Patient Active Problem List   Diagnosis Date Noted  . Lactic acidosis 09/08/2018  . Anasarca 09/07/2018  . Elevated LFTs 09/07/2018  . CHF exacerbation (HCC) 09/07/2018  . Acute on chronic combined systolic and diastolic CHF (congestive heart failure) (HCC) 09/07/2018  . Prolonged QT interval 09/07/2018  . Cellulitis 09/07/2018  . Malnutrition of moderate degree 07/04/2018  . Ventral hernia with bowel obstruction 07/03/2018  . Incarcerated epigastric hernia 07/02/2018  . SBO (small bowel obstruction) (HCC) 07/02/2018  . CKD (chronic kidney disease) stage 3, GFR 30-59 ml/min (HCC) 07/02/2018  . Essential hypertension   . Coronary artery disease due to lipid rich plaque   . Peripheral arterial disease (HCC) 10/26/2017  . Morbid obesity due to excess calories (HCC) 01/25/2017  . Upper airway cough syndrome 01/24/2017  . CAD (coronary artery disease), native coronary artery 03/21/2016  . Chronic combined systolic and diastolic CHF (congestive heart failure) (HCC)   . Dyspnea on exertion   . Thrombocytopenia (HCC)   . Elevated troponin 12/05/2015  . Hyperlipidemia   . Essential hypertension, benign 10/16/2013  . Encounter for long-term (current) use of other medications 10/16/2013  . Nevus, non-neoplastic  07/25/2012  . Varicose veins of lower extremities with other complications 06/25/2012  . Leg pain, bilateral 06/25/2012  . Swelling of limb 03/12/2012  . Cholelithiasis 02/10/2011    Orientation RESPIRATION BLADDER Height & Weight     Self, Time, Situation, Place  Normal Continent, External catheter Weight: 83.5 kg Height:  5' 6" (167.6 cm)  BEHAVIORAL SYMPTOMS/MOOD NEUROLOGICAL BOWEL NUTRITION STATUS      Continent Diet(2 gm na)  AMBULATORY STATUS COMMUNICATION OF NEEDS Skin   Extensive Assist Verbally Skin abrasions, Other (Comment)(bilater lower leg,r, & l toe wounds-cleanse,dry,petrolatum ointment,kerlex dsg qd)                       Personal Care Assistance Level of Assistance  Bathing, Feeding, Dressing Bathing Assistance: Limited assistance Feeding assistance: Limited assistance Dressing Assistance: Limited assistance     Functional Limitations Info  Sight(glasses) Sight Info: Impaired        SPECIAL CARE FACTORS FREQUENCY  PT (By licensed PT), OT (By licensed OT)     PT Frequency: (5x week) OT Frequency: (5x week)            Contractures      Additional Factors Info  Code Status, Allergies Code Status Info: Full code Allergies Info:  Meloxicam, Methocarbamol, Prednisone, Statins, Zetia Ezetimibe, Losartan, Prabotulinumtoxina           Current Medications (09/13/2018):  This is the current hospital active medication list Current Facility-Administered Medications  Medication Dose Route Frequency Provider Last Rate Last Dose  . 0.9 %  sodium chloride infusion  250 mL Intravenous PRN Nettey, Ralph A,   MD      . 0.9 %  sodium chloride infusion   Intra-arterial PRN Narda Bonds, MD      . acetaminophen (TYLENOL) tablet 650 mg  650 mg Oral Q6H PRN Narda Bonds, MD       Or  . acetaminophen (TYLENOL) suppository 650 mg  650 mg Rectal Q6H PRN Narda Bonds, MD      . aspirin EC tablet 81 mg  81 mg Oral Daily Narda Bonds, MD   81 mg at  09/13/18 1001  . cefdinir (OMNICEF) capsule 300 mg  300 mg Oral Q12H Narda Bonds, MD      . Chlorhexidine Gluconate Cloth 2 % PADS 6 each  6 each Topical Daily Narda Bonds, MD   6 each at 09/12/18 2300  . colchicine tablet 0.6 mg  0.6 mg Oral PRN Narda Bonds, MD      . enoxaparin (LOVENOX) injection 40 mg  40 mg Subcutaneous Q24H Narda Bonds, MD   40 mg at 09/12/18 2138  . feeding supplement (ENSURE ENLIVE) (ENSURE ENLIVE) liquid 237 mL  237 mL Oral BID BM Narda Bonds, MD   237 mL at 09/13/18 1007  . feeding supplement (PRO-STAT SUGAR FREE 64) liquid 30 mL  30 mL Oral BID Narda Bonds, MD   30 mL at 09/13/18 1240  . furosemide (LASIX) injection 40 mg  40 mg Intravenous Q8H Narda Bonds, MD   40 mg at 09/13/18 0641  . guaiFENesin (MUCINEX) 12 hr tablet 600 mg  600 mg Oral BID Narda Bonds, MD   600 mg at 09/13/18 1001  . latanoprost (XALATAN) 0.005 % ophthalmic solution 1 drop  1 drop Both Eyes QHS Narda Bonds, MD   1 drop at 09/12/18 2138  . levalbuterol (XOPENEX) nebulizer solution 0.63 mg  0.63 mg Nebulization Q4H PRN Narda Bonds, MD   0.63 mg at 09/08/18 0126  . MEDLINE mouth rinse  15 mL Mouth Rinse BID Narda Bonds, MD   15 mL at 09/12/18 2139  . midodrine (PROAMATINE) tablet 5 mg  5 mg Oral BID WC Narda Bonds, MD   5 mg at 09/13/18 1001  . multivitamin with minerals tablet 1 tablet  1 tablet Oral Daily Narda Bonds, MD   1 tablet at 09/13/18 1001  . mupirocin cream (BACTROBAN) 2 %   Topical Daily Narda Bonds, MD      . pantoprazole (PROTONIX) EC tablet 80 mg  80 mg Oral Daily Narda Bonds, MD   80 mg at 09/13/18 1001  . phenol (CHLORASEPTIC) mouth spray 1 spray  1 spray Mouth/Throat PRN Narda Bonds, MD      . potassium phosphate (monobasic) (K-PHOS ORIGINAL) tablet 500 mg  500 mg Oral TID WC & HS Narda Bonds, MD   500 mg at 09/13/18 1239  . sodium chloride flush (NS) 0.9 % injection 3 mL  3 mL Intravenous Q12H Narda Bonds, MD    3 mL at 09/13/18 1007  . sodium chloride flush (NS) 0.9 % injection 3 mL  3 mL Intravenous PRN Narda Bonds, MD         Discharge Medications: Please see discharge summary for a list of discharge medications.  Relevant Imaging Results:  Relevant Lab Results:   Additional Information ss#[id number]  Lanier Clam, RN

## 2018-09-13 NOTE — Progress Notes (Signed)
Progress Note  Patient Name: Stanley [name] Date of Encounter: 09/13/2018  Primary Cardiologist: [name], MD   Subjective   Pt awake sitting up in bed. No changes in breathing.  Inpatient Medications    Scheduled Meds: . aspirin EC  81 mg Oral Daily  . Chlorhexidine Gluconate Cloth  6 each Topical Daily  . enoxaparin (LOVENOX) injection  40 mg Subcutaneous Q24H  . feeding supplement (ENSURE ENLIVE)  237 mL Oral BID BM  . feeding supplement (PRO-STAT SUGAR FREE 64)  30 mL Oral BID  . furosemide  40 mg Intravenous Q8H  . guaiFENesin  600 mg Oral BID  . latanoprost  1 drop Both Eyes QHS  . mouth rinse  15 mL Mouth Rinse BID  . midodrine  5 mg Oral BID WC  . multivitamin with minerals  1 tablet Oral Daily  . mupirocin cream   Topical Daily  . pantoprazole  80 mg Oral Daily  . potassium chloride  30 mEq Oral Q4H  . potassium phosphate (monobasic)  500 mg Oral TID WC & HS  . sodium chloride flush  3 mL Intravenous Q12H   Continuous Infusions: . sodium chloride    . sodium chloride    . cefTRIAXone (ROCEPHIN)  IV Stopped (09/12/18 2212)   PRN Meds: sodium chloride, [CANCELED] Place/Maintain arterial line **AND** sodium chloride, acetaminophen **OR** acetaminophen, colchicine, levalbuterol, phenol, sodium chloride flush   Vital Signs    Vitals:   09/12/18 2108 09/12/18 2307 09/13/18 0455 09/13/18 0500  BP: 102/86 115/89 102/69   Pulse: 94 (!) 107 81   Resp: 20 20 18    Temp: 97.8 F (36.6 C) 97.8 F (36.6 C) 97.8 F (36.6 C)   TempSrc: Oral Oral Oral   SpO2: 93% 98% 99%   Weight:    83.5 kg  Height:        Intake/Output Summary (Last 24 hours) at 09/13/2018 0851 Last data filed at 09/13/2018 0600 Gross per 24 hour  Intake 954.06 ml  Output 1375 ml  Net -420.94 ml   Last 3 Weights 09/13/2018 09/12/2018 09/11/2018  Weight (lbs) 184 lb 1.4 oz 183 lb 10.3 oz 182 lb 5.1 oz  Weight (kg) 83.5 kg 83.3 kg 82.7 kg      Telemetry    Sinus, PVCs - Personally  Reviewed  ECG    No new tracings - Personally Reviewed  Physical Exam   GEN: No acute distress.   Neck: questionable JVD Cardiac: RRR, no murmurs, rubs, or gallops.  Respiratory: respirations unlabored, crackles in bases GI: Soft, nontender, non-distended  MS: + B LE edema legs wrapped Neuro:  Nonfocal  Psych: Normal affect   Labs    Chemistry Recent Labs  Lab 09/11/18 0537 09/12/18 0235 09/13/18 0228  NA 137 137 138  K 3.2* 3.3* 2.9*  CL 104 102 98  CO2 24 24 28   GLUCOSE 94 107* 104*  BUN 36* 36* 39*  CREATININE 1.20 1.09 1.03  CALCIUM 9.0 9.4 9.5  PROT 5.7* 6.3* 6.5  ALBUMIN 2.3* 2.3* 2.2*  AST 33 35 41  ALT 30 31 31   ALKPHOS 103 110 126  BILITOT 5.8* 6.7* 6.2*  GFRNONAA >60 >60 >60  GFRAA >60 >60 >60  ANIONGAP 9 11 12      Hematology Recent Labs  Lab 09/09/18 1549 09/11/18 0924 09/12/18 0235  WBC 8.6 7.8 7.4  RBC 4.81 4.18* 4.33  HGB 15.9 14.1 14.6  HCT 50.5 43.2 44.5  MCV 105.0* 103.3* 102.8*  MCH 33.1 33.7 33.7  MCHC 31.5 32.6 32.8  RDW 14.3 14.0 13.8  PLT 119* 96* 111*    Cardiac Enzymes Recent Labs  Lab 09/07/18 1935 09/08/18 0108 09/08/18 0704  TROPONINI 0.11* 0.19* 0.18*    Recent Labs  Lab 09/07/18 1615  TROPIPOC 0.28*     BNP Recent Labs  Lab 09/07/18 1528 09/08/18 1124  BNP 1,691.4* 1,619.6*     DDimer No results for input(s): DDIMER in the last 168 hours.   Radiology    Dg Chest Port 1 View  Result Date: 09/12/2018 CLINICAL DATA:  Shortness of breath EXAM: PORTABLE CHEST 1 VIEW COMPARISON:  September 11, 2018 FINDINGS: There is persistent cardiomegaly with pulmonary vascularity normal. Patient is status post median sternotomy. There are pleural effusions bilaterally with bibasilar atelectasis. No new opacities are evident. There are metallic fragments along the midline of the thorax. Postoperative changes noted in the right mandible region. IMPRESSION: Stable cardiomegaly with pleural effusions bilaterally. Bibasilar  atelectasis. No new opacity. Electronically Signed   By: Bretta Bang III M.D.   On: 09/12/2018 07:03    Cardiac Studies   Echo 09/08/18: IMPRESSIONS  1. The left ventricle has moderately reduced systolic function, with an ejection fraction of 35-40%. The cavity size was normal. There is severely increased left ventricular wall thickness. Left ventricular diastolic Doppler parameters are  indeterminate. Left ventricular diffuse hypokinesis. 2. The right ventricle has moderately reduced systolic function. The cavity was normal. There is mildly increased right ventricular wall thickness. 3. Left atrial size was moderately dilated. 4. Mitral valve regurgitation is moderate by color flow Doppler. 5. Tricuspid valve regurgitation is moderate. 6. Mild sclerosis of the aortic valve. Aortic valve regurgitation is trivial by color flow Doppler. 7. The inferior vena cava was normal in size with <50% respiratory variability. 8. Trivial pericardial effusion primarly around the right atrium. 9. Moderate pleural effusion. 10. When compared to the prior study: The ejection fraction has decreased slightly. Mitral and tricuspid regurgitation have increase slightly by visual estimate.  Patient Profile     73 y.o. male with chronic systolic and diastolic heart failure, hypertension, hyperlipidemia, CAD s/p RCA PCI (2002), and hepatic steatosis here with acute on chronic systolic and diastolic heart failure and sepsis 2/2 cellulitis in the setting of medication non-compliance.  Assessment & Plan    1. Acute on chronic combined systolic and diastolic heart failure 2. Mixed cardiogenic/septic shock 3. Hypotension 4. AKI - EF this admission reduced from 45-5% in 11/2017 to 35-40% - diuresing on 40 mg TID - but only received 2 doses yesterday, will start TID today - overall net negative 2.4 L with 1.3 L urine output yesterday - urine output is less compared to previous day - sCr 1.03, K 2.9 -  replace with PO - baseline creatinine 1.2-1.4 - weight has increased to 184 lbs - was as low as 179 lbs several days ago - midodrine for pressure support while diuresing   5. CAD s/p PCI 6 Hyperlipidemia - denies anginal symptoms - no statin for anaphylaxis - 09/08/2018: Cholesterol 135; HDL 35; LDL Cholesterol 92; Triglycerides 41; VLDL 8 - study coordinator for trial is aware of pt's hospitalization      For questions or updates, please contact [facility] Please consult [email or web address] for contact info under        Signed, Marcelino Duster, PA  09/13/2018, 8:51 AM

## 2018-09-13 NOTE — [place] FL2 (Signed)
[place] MEDICAID FL2 LEVEL OF CARE SCREENING TOOL     IDENTIFICATION  Patient Name: Stanley [name] Birthdate: 1946/02/07 Sex: male Admission Date (Current Location): 09/07/2018  [facility] and [place] Number:  Producer, television/film/video and Address:  [facility],  [address] [place], [place] [address]      Provider Number: [id number]  Attending Physician Name and Address:  Narda Bonds, MD  Relative Name and Phone Number:  Caroline Sauger [phone])    Current Level of Care: Hospital Recommended Level of Care: Skilled Nursing Facility Prior Approval Number:    Date Approved/Denied:   PASRR Number: [id number]  Discharge Plan: SNF    Current Diagnoses: Patient Active Problem List   Diagnosis Date Noted  . Lactic acidosis 09/08/2018  . Anasarca 09/07/2018  . Elevated LFTs 09/07/2018  . CHF exacerbation (HCC) 09/07/2018  . Acute on chronic combined systolic and diastolic CHF (congestive heart failure) (HCC) 09/07/2018  . Prolonged QT interval 09/07/2018  . Cellulitis 09/07/2018  . Malnutrition of moderate degree 07/04/2018  . Ventral hernia with bowel obstruction 07/03/2018  . Incarcerated epigastric hernia 07/02/2018  . SBO (small bowel obstruction) (HCC) 07/02/2018  . CKD (chronic kidney disease) stage 3, GFR 30-59 ml/min (HCC) 07/02/2018  . Essential hypertension   . Coronary artery disease due to lipid rich plaque   . Peripheral arterial disease (HCC) 10/26/2017  . Morbid obesity due to excess calories (HCC) 01/25/2017  . Upper airway cough syndrome 01/24/2017  . CAD (coronary artery disease), native coronary artery 03/21/2016  . Chronic combined systolic and diastolic CHF (congestive heart failure) (HCC)   . Dyspnea on exertion   . Thrombocytopenia (HCC)   . Elevated troponin 12/05/2015  . Hyperlipidemia   . Essential hypertension, benign 10/16/2013  . Encounter for long-term (current) use of other medications 10/16/2013  . Nevus, non-neoplastic  07/25/2012  . Varicose veins of lower extremities with other complications 06/25/2012  . Leg pain, bilateral 06/25/2012  . Swelling of limb 03/12/2012  . Cholelithiasis 02/10/2011    Orientation RESPIRATION BLADDER Height & Weight     Self, Time, Situation, Place  Normal Continent, External catheter Weight: 83.5 kg Height:  5\' 6"  (167.6 cm)  BEHAVIORAL SYMPTOMS/MOOD NEUROLOGICAL BOWEL NUTRITION STATUS      Continent Diet(2 gm na)  AMBULATORY STATUS COMMUNICATION OF NEEDS Skin   Extensive Assist Verbally Skin abrasions, Other (Comment)(bilater lower leg,r, & l toe wounds-cleanse,dry,petrolatum ointment,kerlex dsg qd)                       Personal Care Assistance Level of Assistance  Bathing, Feeding, Dressing Bathing Assistance: Limited assistance Feeding assistance: Limited assistance Dressing Assistance: Limited assistance     Functional Limitations Info  Sight(glasses) Sight Info: Impaired        SPECIAL CARE FACTORS FREQUENCY  PT (By licensed PT), OT (By licensed OT)     PT Frequency: (5x week) OT Frequency: (5x week)            Contractures      Additional Factors Info  Code Status, Allergies Code Status Info: Full code Allergies Info:  Meloxicam, Methocarbamol, Prednisone, Statins, Zetia Ezetimibe, Losartan, Prabotulinumtoxina           Current Medications (09/13/2018):  This is the current hospital active medication list Current Facility-Administered Medications  Medication Dose Route Frequency Provider Last Rate Last Dose  . 0.9 %  sodium chloride infusion  250 mL Intravenous PRN Narda Bonds,  MD      . 0.9 %  sodium chloride infusion   Intra-arterial PRN Narda Bonds, MD      . acetaminophen (TYLENOL) tablet 650 mg  650 mg Oral Q6H PRN Narda Bonds, MD       Or  . acetaminophen (TYLENOL) suppository 650 mg  650 mg Rectal Q6H PRN Narda Bonds, MD      . aspirin EC tablet 81 mg  81 mg Oral Daily Narda Bonds, MD   81 mg at  09/13/18 1001  . cefdinir (OMNICEF) capsule 300 mg  300 mg Oral Q12H Narda Bonds, MD      . Chlorhexidine Gluconate Cloth 2 % PADS 6 each  6 each Topical Daily Narda Bonds, MD   6 each at 09/12/18 2300  . colchicine tablet 0.6 mg  0.6 mg Oral PRN Narda Bonds, MD      . enoxaparin (LOVENOX) injection 40 mg  40 mg Subcutaneous Q24H Narda Bonds, MD   40 mg at 09/12/18 2138  . feeding supplement (ENSURE ENLIVE) (ENSURE ENLIVE) liquid 237 mL  237 mL Oral BID BM Narda Bonds, MD   237 mL at 09/13/18 1007  . feeding supplement (PRO-STAT SUGAR FREE 64) liquid 30 mL  30 mL Oral BID Narda Bonds, MD   30 mL at 09/13/18 1240  . furosemide (LASIX) injection 40 mg  40 mg Intravenous Q8H Narda Bonds, MD   40 mg at 09/13/18 0641  . guaiFENesin (MUCINEX) 12 hr tablet 600 mg  600 mg Oral BID Narda Bonds, MD   600 mg at 09/13/18 1001  . latanoprost (XALATAN) 0.005 % ophthalmic solution 1 drop  1 drop Both Eyes QHS Narda Bonds, MD   1 drop at 09/12/18 2138  . levalbuterol (XOPENEX) nebulizer solution 0.63 mg  0.63 mg Nebulization Q4H PRN Narda Bonds, MD   0.63 mg at 09/08/18 0126  . MEDLINE mouth rinse  15 mL Mouth Rinse BID Narda Bonds, MD   15 mL at 09/12/18 2139  . midodrine (PROAMATINE) tablet 5 mg  5 mg Oral BID WC Narda Bonds, MD   5 mg at 09/13/18 1001  . multivitamin with minerals tablet 1 tablet  1 tablet Oral Daily Narda Bonds, MD   1 tablet at 09/13/18 1001  . mupirocin cream (BACTROBAN) 2 %   Topical Daily Narda Bonds, MD      . pantoprazole (PROTONIX) EC tablet 80 mg  80 mg Oral Daily Narda Bonds, MD   80 mg at 09/13/18 1001  . phenol (CHLORASEPTIC) mouth spray 1 spray  1 spray Mouth/Throat PRN Narda Bonds, MD      . potassium phosphate (monobasic) (K-PHOS ORIGINAL) tablet 500 mg  500 mg Oral TID WC & HS Narda Bonds, MD   500 mg at 09/13/18 1239  . sodium chloride flush (NS) 0.9 % injection 3 mL  3 mL Intravenous Q12H Narda Bonds, MD    3 mL at 09/13/18 1007  . sodium chloride flush (NS) 0.9 % injection 3 mL  3 mL Intravenous PRN Narda Bonds, MD         Discharge Medications: Please see discharge summary for a list of discharge medications.  Relevant Imaging Results:  Relevant Lab Results:   Additional Information ss#[id number]  Lanier Clam, RN

## 2018-09-13 NOTE — Progress Notes (Signed)
Pharmacist Heart Failure Core Measure Documentation  Assessment: Stanley [name] has an EF documented as 35-40% on 09/08/18 by ECHO.  Rationale: Heart failure patients with left ventricular systolic dysfunction (LVSD) and an EF < 40% should be prescribed an angiotensin converting enzyme inhibitor (ACEI) or angiotensin receptor blocker (ARB) at discharge unless a contraindication is documented in the medical record.  This patient is not currently on an ACEI or ARB for HF.  This note is being placed in the record in order to provide documentation that a contraindication to the use of these agents is present for this encounter.  ACE Inhibitor or Angiotensin Receptor Blocker is contraindicated (specify all that apply)  []   ACEI allergy AND ARB allergy []   Angioedema []   Moderate or severe aortic stenosis []   Hyperkalemia [x]   Hypotension []   Renal artery stenosis []   Worsening renal function, preexisting renal disease or dysfunction   Berkley Harvey 09/13/2018 10:01 AM

## 2018-09-14 LAB — URINALYSIS, ROUTINE W REFLEX MICROSCOPIC
BILIRUBIN URINE: NEGATIVE
Glucose, UA: NEGATIVE mg/dL
Hgb urine dipstick: NEGATIVE
Ketones, ur: NEGATIVE mg/dL
Leukocytes,Ua: NEGATIVE
NITRITE: NEGATIVE
Protein, ur: NEGATIVE mg/dL
Specific Gravity, Urine: 1.014 (ref 1.005–1.030)
pH: 5 (ref 5.0–8.0)

## 2018-09-14 LAB — BASIC METABOLIC PANEL
Anion gap: 7 (ref 5–15)
BUN: 39 mg/dL — AB (ref 8–23)
CO2: 28 mmol/L (ref 22–32)
Calcium: 9.5 mg/dL (ref 8.9–10.3)
Chloride: 102 mmol/L (ref 98–111)
Creatinine, Ser: 0.95 mg/dL (ref 0.61–1.24)
GFR calc Af Amer: >60 mL/min (ref >60)
GFR calc non Af Amer: >60 mL/min (ref >60)
Glucose, Bld: 98 mg/dL (ref 70–99)
Potassium: 3 mmol/L — ABNORMAL LOW (ref 3.5–5.1)
Sodium: 137 mmol/L (ref 135–145)

## 2018-09-14 LAB — HEPATIC FUNCTION PANEL
ALT: 35 U/L (ref 0–44)
AST: 53 U/L — ABNORMAL HIGH (ref 15–41)
Albumin: 2.4 g/dL — ABNORMAL LOW (ref 3.5–5.0)
Alkaline Phosphatase: 122 U/L (ref 38–126)
BILIRUBIN DIRECT: 4.4 mg/dL — AB (ref 0.0–0.2)
Indirect Bilirubin: 3.5 mg/dL — ABNORMAL HIGH (ref 0.3–0.9)
Total Bilirubin: 7.9 mg/dL — ABNORMAL HIGH (ref 0.3–1.2)
Total Protein: 6.3 g/dL — ABNORMAL LOW (ref 6.5–8.1)

## 2018-09-14 LAB — PROTIME-INR
INR: 1.3 — ABNORMAL HIGH (ref 0.8–1.2)
Prothrombin Time: 16 seconds — ABNORMAL HIGH (ref 11.4–15.2)

## 2018-09-14 MED ORDER — POTASSIUM CHLORIDE CRYS ER 20 MEQ PO TBCR
30.0000 meq | EXTENDED_RELEASE_TABLET | ORAL | Status: AC
  Administered 2018-09-14 (×2): 30 meq via ORAL
  Filled 2018-09-14 (×2): qty 1

## 2018-09-14 NOTE — Progress Notes (Signed)
Physical Therapy Treatment Patient Details Name: Stanley [name] MRN: [id number] DOB: Nov 24, 1945 Today's Date: 09/14/2018    History of Present Illness 73 y.o. male with medical history significant of CHF, HLD, CAD, PAD, hyperparathyroidism prediabetes, and hepatic steatosis. Presenting with SOB and blisters at BLEs    PT Comments    Pt  Progressing slowly;  Continue  To  Recommend SNF for post acute rehab   Follow Up Recommendations  SNF     Equipment Recommendations  None recommended by PT    Recommendations for Other Services       Precautions / Restrictions Precautions Precautions: Fall Precaution Comments: multiple  blisters on legs, SOB Restrictions Weight Bearing Restrictions: No    Mobility  Bed Mobility         Supine to sit: Mod assist;+2 for safety/equipment     General bed mobility comments: pt needs most of the assistance to scoot forward to EOB.  Painful behind L knee when unsupported and used pad to scoot pt. HOB raised and pt used rail  Transfers Overall transfer level: Needs assistance Equipment used: (stedy)   Sit to Stand: Max assist;+2 physical assistance;+2 safety/equipment Stand pivot transfers: (stedy)       General transfer comment: assist to rise and perform anterior weight shift to use seat on steady  Ambulation/Gait                 Stairs             Wheelchair Mobility    Modified Rankin (Stroke Patients Only)       Balance Overall balance assessment: Needs assistance   Sitting balance-Leahy Scale: Fair     Standing balance support: Bilateral upper extremity supported;During functional activity Standing balance-Leahy Scale: Zero                              Cognition Arousal/Alertness: Awake/alert Behavior During Therapy: WFL for tasks assessed/performed Overall Cognitive Status: Within Functional Limits for tasks assessed                                         Exercises General Exercises - Lower Extremity Ankle Circles/Pumps: AROM;Both;10 reps Other Exercises Other Exercises: pt has been performing lap slides on his own    General Comments        Pertinent Vitals/Pain Pain Assessment: Faces Faces Pain Scale: Hurts even more Pain Location: bil LEs (L knee>R); R shoulder Pain Descriptors / Indicators: Discomfort;Grimacing;Sore Pain Intervention(s): Monitored during session;Repositioned    Home Living                      Prior Function            PT Goals (current goals can now be found in the care plan section) Acute Rehab PT Goals Patient Stated Goal: "Get stronger" PT Goal Formulation: With patient Time For Goal Achievement: 09/22/18 Potential to Achieve Goals: Good Progress towards PT goals: Progressing toward goals    Frequency    Min 2X/week      PT Plan Current plan remains appropriate    Co-evaluation PT/OT/SLP Co-Evaluation/Treatment: Yes Reason for Co-Treatment: Complexity of the patient's impairments (multi-system involvement) PT goals addressed during session: Mobility/safety with mobility OT goals addressed during session: ADL's and self-care      AM-PAC PT "6 Clicks" Mobility  Outcome Measure  Help needed turning from your back to your side while in a flat bed without using bedrails?: A Lot Help needed moving from lying on your back to sitting on the side of a flat bed without using bedrails?: A Lot Help needed moving to and from a bed to a chair (including a wheelchair)?: A Lot Help needed standing up from a chair using your arms (e.g., wheelchair or bedside chair)?: A Lot Help needed to walk in hospital room?: A Lot Help needed climbing 3-5 steps with a railing? : A Lot 6 Click Score: 12    End of Session   Activity Tolerance: Patient limited by fatigue Patient left: in chair;with call bell/phone within reach;with chair alarm set Nurse Communication: Need for lift equipment;Mobility  status PT Visit Diagnosis: Unsteadiness on feet (R26.81)     Time: 1859-0931 PT Time Calculation (min) (ACUTE ONLY): 23 min  Charges:  $Therapeutic Activity: 8-22 mins                     Drucilla Chalet, PT  Pager: [phone] Acute Rehab Dept [facility]): [phone]   09/14/2018    [facility] 09/14/2018, 1:33 PM

## 2018-09-14 NOTE — Care Management Important Message (Signed)
Important Message  Patient Details  Name: JERREN LANDESMAN MRN: [id number] Date of Birth: [birth year]   Medicare Important Message Given:  Yes    Caren Macadam 09/14/2018, 12:18 PMImportant Message  Patient Details  Name: VASCO BUNKER MRN: [id number] Date of Birth: 11/23/1945   Medicare Important Message Given:  Yes    Caren Macadam 09/14/2018, 12:18 PM

## 2018-09-14 NOTE — Progress Notes (Signed)
PROGRESS NOTE    Stanley [name]  ZOX:[id number] DOB: [birth year] DOA: 09/07/2018 PCP: Daisy Florooss, Charles Alan, MD   Brief Narrative: Stanley [name] is a 73 y.o. malewith medical history significant of CHF, HLD, CAD, and PADhistory ofhyperparathyroidism prediabetes, hepatic steatosis. He presented secondary to shortness of breath and fatigue in addition to LE swelling, found to have an acute heart failure exacerbation and cellulitis.   Assessment & Plan:   Principal Problem:   Acute on chronic combined systolic and diastolic CHF (congestive heart failure) (HCC) Active Problems:   Essential hypertension, benign   Hyperlipidemia   Elevated troponin   Thrombocytopenia (HCC)   CAD (coronary artery disease), native coronary artery   Peripheral arterial disease (HCC)   Essential hypertension   CKD (chronic kidney disease) stage 3, GFR 30-59 ml/min (HCC)   Anasarca   Elevated LFTs   CHF exacerbation (HCC)   Prolonged QT interval   Cellulitis   Lactic acidosis   Acute on chronic combined systolic and diastolic heart failure On room air. Significant for LE edema. Cardiology on board. EF of 35-40%. Currently being diuresed. Not on a beta blocker or ACEi/ARB secondary to hypotension and heart block -Cardiology recommendations: Lasix, midodrine. Plan to switch to PO  Hypotension Secondary to aggressive diuresis. Started on midodrine.  CAD/PAD Hyperlipidemia -Continue aspirin  CKD stage III Stable during diuresis  Elevated LFTs Hyperbilirubinemia Patient currently is being managed/worked up for hepatic dysfunction. He has persistently high bilirubin. -Repeat hepatic function, INR  Prolonged QTc Mild. Had short run of V-tach. Currently on Lasix. Cardiology on board.  Sepsis In setting of cellulitis per chart review. Empirically on vancomycin and ceftriaxone. Blood cultures no growth. On exam, no evidence of cellulitis today. I did not see patient on presentation, however.  Completed antibiotic course.  Hypokalemia Secondary to lasix. Given supplementation -Continue potassium supplementation  Dark urine Hematuria vs bilirubinemia. Previous urinalysis suggests hematuria cause -Repeat urinalysis   DVT prophylaxis: Lovenox Code Status:   Code Status: Full Code Family Communication: None at bedside Disposition Plan: Discharge SNF. Hopefully medically ready in 24-48 hours pending diuresis.   Consultants:   Cardiology  Procedures:   None  Antimicrobials:  Vancomycin  Zosyn  Ceftriaxone  Cefdinir   Subjective: No issues overnight  Objective: Vitals:   09/13/18 2013 09/13/18 2235 09/14/18 0458 09/14/18 0700  BP: 107/76 103/74 99/71 (!) 89/75  Pulse: 81 94 79 93  Resp: 14  14   Temp: 97.9 F (36.6 C)  97.8 F (36.6 C)   TempSrc: Oral  Oral   SpO2: 98%  91%   Weight:    81.2 kg  Height:        Intake/Output Summary (Last 24 hours) at 09/14/2018 1118 Last data filed at 09/14/2018 0600 Gross per 24 hour  Intake 697 ml  Output 1975 ml  Net -1278 ml   Filed Weights   09/12/18 0500 09/13/18 0500 09/14/18 0700  Weight: 83.3 kg 83.5 kg 81.2 kg    Examination:  General exam: Appears calm and comfortable Respiratory system: Clear to auscultation. Respiratory effort normal. Cardiovascular system: S1 & S2 heard, RRR. No murmurs, rubs, gallops or clicks. Gastrointestinal system: Abdomen is nondistended, soft and nontender. No organomegaly or masses felt. Normal bowel sounds heard. Central nervous system: Alert and oriented. No focal neurological deficits. Extremities: 1+ LE edema. No calf tenderness Skin: No cyanosis. LE wounds wrapped in gauze. ecchymosis at right antecubital IV site Psychiatry: Judgement and insight appear normal. Mood & affect appropriate.  Data Reviewed: I have personally reviewed following labs and imaging studies  CBC: Recent Labs  Lab 09/07/18 1507 09/08/18 0130 09/09/18 0422 09/09/18 1549 09/11/18  0924 09/12/18 0235  WBC 12.2* 10.8* 9.8 8.6 7.8 7.4  NEUTROABS 9.8*  --   --   --   --  5.6  HGB 17.8* 15.9 14.7 15.9 14.1 14.6  HCT 56.9* 49.1 46.2 50.5 43.2 44.5  MCV 105.8* 105.4* 106.5* 105.0* 103.3* 102.8*  PLT 147* 124* 105* 119* 96* 111*   Basic Metabolic Panel: Recent Labs  Lab 09/08/18 0130  09/10/18 0347 09/11/18 0537 09/12/18 0235 09/13/18 0228 09/13/18 1411 09/14/18 0511  NA 139   < > 137 137 137 138  --  137  K 4.5   < > 3.8 3.2* 3.3* 2.9* 4.2 3.0*  CL 104   < > 104 104 102 98  --  102  CO2 21*   < > 24 24 24 28   --  28  GLUCOSE 110*   < > 119* 94 107* 104*  --  98  BUN 36*   < > 38* 36* 36* 39*  --  39*  CREATININE 1.37*   < > 1.28* 1.20 1.09 1.03  --  0.95  CALCIUM 9.9   < > 9.4 9.0 9.4 9.5  --  9.5  MG 1.8  --  1.9  --  1.6* 1.7  --   --   PHOS 3.1  --   --   --  2.1*  --   --   --    < > = values in this interval not displayed.   GFR: Estimated Creatinine Clearance: 70.4 mL/min (by C-G formula based on SCr of 0.95 mg/dL). Liver Function Tests: Recent Labs  Lab 09/09/18 1549 09/10/18 0347 09/11/18 0537 09/12/18 0235 09/13/18 0228  AST 52* 41 33 35 41  ALT 40 36 30 31 31   ALKPHOS 123 116 103 110 126  BILITOT 6.7* 5.8* 5.8* 6.7* 6.2*  PROT 6.8 6.3* 5.7* 6.3* 6.5  ALBUMIN 2.7* 2.5* 2.3* 2.3* 2.2*   No results for input(s): LIPASE, AMYLASE in the last 168 hours. No results for input(s): AMMONIA in the last 168 hours. Coagulation Profile: Recent Labs  Lab 09/07/18 1908 09/14/18 1043  INR 1.7* 1.3*   Cardiac Enzymes: Recent Labs  Lab 09/07/18 1908 09/07/18 1935 09/08/18 0108 09/08/18 0704  CKTOTAL 84  --   --   --   TROPONINI  --  0.11* 0.19* 0.18*   BNP (last 3 results) Recent Labs    12/05/17 0909 12/19/17 1236 01/31/18 1139  PROBNP 1,776* 1,961* 2,225*   HbA1C: No results for input(s): HGBA1C in the last 72 hours. CBG: No results for input(s): GLUCAP in the last 168 hours. Lipid Profile: No results for input(s): CHOL, HDL,  LDLCALC, TRIG, CHOLHDL, LDLDIRECT in the last 72 hours. Thyroid Function Tests: No results for input(s): TSH, T4TOTAL, FREET4, T3FREE, THYROIDAB in the last 72 hours. Anemia Panel: No results for input(s): VITAMINB12, FOLATE, FERRITIN, TIBC, IRON, RETICCTPCT in the last 72 hours. Sepsis Labs: Recent Labs  Lab 09/07/18 1908  09/09/18 0430 09/09/18 0837 09/09/18 1549 09/09/18 1901  PROCALCITON 0.84  --   --   --   --   --   LATICACIDVEN  --    < > 2.4* 2.2* 3.2* 3.4*   < > = values in this interval not displayed.    Recent Results (from the past 240 hour(s))  Blood culture (routine x  2)     Status: None   Collection Time: 09/07/18  3:17 PM  Result Value Ref Range Status   Specimen Description   Final    BLOOD RIGHT WRIST Performed at [facility], [address] [place], [place] [address]    Special Requests   Final    BOTTLES DRAWN AEROBIC ONLY Blood Culture adequate volume Performed at [facility], [address], [place], [place] [address]    Culture   Final    NO GROWTH 5 DAYS Performed at [facility], [address], [place], [place] [address]    Report Status 09/12/2018 FINAL  Final  Blood culture (routine x 2)     Status: None   Collection Time: 09/07/18  3:22 PM  Result Value Ref Range Status   Specimen Description   Final    BLOOD RIGHT ANTECUBITAL Performed at [facility], [address], [place], [place] [address]    Special Requests   Final    BOTTLES DRAWN AEROBIC AND ANAEROBIC Blood Culture results may not be optimal due to an inadequate volume of blood received in culture bottles Performed at [facility], [address], [place], [place] [address]    Culture   Final    NO GROWTH 5 DAYS Performed at [facility], [address], [place], [place] [address]    Report Status 09/12/2018 FINAL  Final  MRSA PCR Screening     Status: None   Collection Time: 09/07/18  8:38 PM   Result Value Ref Range Status   MRSA by PCR NEGATIVE NEGATIVE Final    Comment:        The GeneXpert MRSA Assay (FDA approved for NASAL specimens only), is one component of a comprehensive MRSA colonization surveillance program. It is not intended to diagnose MRSA infection nor to guide or monitor treatment for MRSA infections. Performed at [facility], [address] [place], [place] [address]          Radiology Studies: No results found.      Scheduled Meds: . aspirin EC  81 mg Oral Daily  . Chlorhexidine Gluconate Cloth  6 each Topical Daily  . enoxaparin (LOVENOX) injection  40 mg Subcutaneous Q24H  . feeding supplement (ENSURE ENLIVE)  237 mL Oral BID BM  . feeding supplement (PRO-STAT SUGAR FREE 64)  30 mL Oral BID  . furosemide  40 mg Intravenous Q8H  . guaiFENesin  600 mg Oral BID  . latanoprost  1 drop Both Eyes QHS  . mouth rinse  15 mL Mouth Rinse BID  . midodrine  5 mg Oral BID WC  . multivitamin with minerals  1 tablet Oral Daily  . mupirocin cream   Topical Daily  . pantoprazole  80 mg Oral Daily  . sodium chloride flush  3 mL Intravenous Q12H   Continuous Infusions: . sodium chloride    . sodium chloride       LOS: 6 days     Jacquelin Hawking, MD [facility] 09/14/2018, 11:18 AM  If 7PM-7AM, please contact night-coverage [email or web address]

## 2018-09-14 NOTE — Progress Notes (Signed)
Occupational Therapy Treatment Patient Details Name: Stanley [name] MRN: [id number] DOB: [birth year] Today's Date: 09/14/2018    History of present illness 73 y.o. male with medical history significant of CHF, HLD, CAD, PAD, hyperparathyroidism prediabetes, and hepatic steatosis. Presenting with SOB and blisters at BLEs   OT comments  Improved sit to stand today; used stedy from high bed.  UB adls remain at min A due to R shoulder pain   Follow Up Recommendations  SNF    Equipment Recommendations  None recommended by OT    Recommendations for Other Services      Precautions / Restrictions Precautions Precautions: Fall Precaution Comments: multiple  blisters on legs, SOB, Sats OK Restrictions Weight Bearing Restrictions: No       Mobility Bed Mobility         Supine to sit: Mod assist;+2 for safety/equipment     General bed mobility comments: pt needs most of the assistance to scoot forward to EOB.  Painful behind L knee when unsupported and used pad to scoot pt. HOB raised and pt used Publishing rights manager used: (stedy)   Sit to Stand: Max assist;+2 physical assistance;+2 safety/equipment         General transfer comment: assist to rise and perform anterior weight shift to use seat on steady    Balance                                           ADL either performed or assessed with clinical judgement   ADL           Upper Body Bathing: Minimal assistance;Sitting       Upper Body Dressing : Minimal assistance;Sitting                     General ADL Comments: simulated UB bathing and donned gown from chair level working within tolerance.     Vision       Perception     Praxis      Cognition Arousal/Alertness: Awake/alert Behavior During Therapy: WFL for tasks assessed/performed Overall Cognitive Status: Within Functional Limits for tasks assessed                                           Exercises Other Exercises Other Exercises: pt has been performing lap slides on his own   Shoulder Instructions       General Comments      Pertinent Vitals/ Pain       Pain Assessment: Faces Faces Pain Scale: Hurts even more Pain Location: bil LEs (L knee>R); R shoulder Pain Descriptors / Indicators: Discomfort;Grimacing;Sore Pain Intervention(s): Limited activity within patient's tolerance;Monitored during session;Repositioned  Home Living                                          Prior Functioning/Environment              Frequency           Progress Toward Goals  OT Goals(current goals can now be found in the care plan section)  Progress towards OT goals: Progressing toward goals     Plan  Co-evaluation    PT/OT/SLP Co-Evaluation/Treatment: Yes Reason for Co-Treatment: Complexity of the patient's impairments (multi-system involvement) PT goals addressed during session: Mobility/safety with mobility OT goals addressed during session: ADL's and self-care      AM-PAC OT "6 Clicks" Daily Activity     Outcome Measure   Help from another person eating meals?: A Little Help from another person taking care of personal grooming?: A Little Help from another person toileting, which includes using toliet, bedpan, or urinal?: Total Help from another person bathing (including washing, rinsing, drying)?: A Lot Help from another person to put on and taking off regular upper body clothing?: A Little Help from another person to put on and taking off regular lower body clothing?: Total 6 Click Score: 13    End of Session    OT Visit Diagnosis: Unsteadiness on feet (R26.81);Other abnormalities of gait and mobility (R26.89);Muscle weakness (generalized) (M62.81);Pain Pain - Right/Left: Right Pain - part of body: Shoulder   Activity Tolerance Patient tolerated treatment well   Patient Left in chair;with call bell/phone within reach;with chair  alarm set   Nurse Communication Need for lift equipment(maximove lift under pt)        Time: 1497-0263 OT Time Calculation (min): 23 min  Charges: OT Treatments $Self Care/Home Management : 8-22 mins  Marica Otter, OTR/L Acute Rehabilitation Services [phone] [facility] pager [phone] office 09/14/2018   Laniesha Das 09/14/2018, 12:23 PM

## 2018-09-14 NOTE — TOC Transition Note (Signed)
Transition of Care [facility]) - CM/SW Discharge Note   Patient Details  Name: WASIM GOODNOW MRN: [id number] Date of Birth: June 17, 1946  [facility] CM/SW Contact:  Lanier Clam, RN Phone Number: 09/14/2018, 12:23 PM   Clinical Narrative: [facility] left vm w/Janie Francesco Runner [phone] about the status of auth-informed of possible d/c in am-await call back. Also spoke to patient's friend Caroline Sauger H#[phone] is also aware of waiting on auth. Will continue to follow.      Final next level of care: Skilled Nursing Facility Barriers to Discharge: No Barriers Identified   Patient Goals and CMS Choice Patient states their goals for this hospitalization and ongoing recovery are:: (get stronger) CMS Medicare.gov Compare Post Acute Care list provided to:: Patient Choice offered to / list presented to : Patient  Discharge Placement                       Discharge Plan and Services   Discharge Planning Services: CM Consult Post Acute Care Choice: Durable Medical Equipment(rw)                    Social Determinants of Health (SDOH) Interventions     Readmission Risk Interventions No flowsheet data found.

## 2018-09-14 NOTE — Progress Notes (Addendum)
Progress Note  Patient Name: Stanley [name] Date of Encounter: 09/14/2018  Primary Cardiologist: [name], MD   Subjective   Denies any CP or SOB.   Inpatient Medications    Scheduled Meds: . aspirin EC  81 mg Oral Daily  . cefdinir  300 mg Oral Q12H  . Chlorhexidine Gluconate Cloth  6 each Topical Daily  . enoxaparin (LOVENOX) injection  40 mg Subcutaneous Q24H  . feeding supplement (ENSURE ENLIVE)  237 mL Oral BID BM  . feeding supplement (PRO-STAT SUGAR FREE 64)  30 mL Oral BID  . furosemide  40 mg Intravenous Q8H  . guaiFENesin  600 mg Oral BID  . latanoprost  1 drop Both Eyes QHS  . mouth rinse  15 mL Mouth Rinse BID  . midodrine  5 mg Oral BID WC  . multivitamin with minerals  1 tablet Oral Daily  . mupirocin cream   Topical Daily  . pantoprazole  80 mg Oral Daily  . potassium chloride  30 mEq Oral Q4H  . sodium chloride flush  3 mL Intravenous Q12H   Continuous Infusions: . sodium chloride    . sodium chloride     PRN Meds: sodium chloride, [CANCELED] Place/Maintain arterial line **AND** sodium chloride, acetaminophen **OR** acetaminophen, colchicine, levalbuterol, phenol, sodium chloride flush   Vital Signs    Vitals:   09/13/18 2013 09/13/18 2235 09/14/18 0458 09/14/18 0700  BP: 107/76 103/74 99/71 (!) 89/75  Pulse: 81 94 79 93  Resp: 14  14   Temp: 97.9 F (36.6 C)  97.8 F (36.6 C)   TempSrc: Oral  Oral   SpO2: 98%  91%   Weight:    81.2 kg  Height:        Intake/Output Summary (Last 24 hours) at 09/14/2018 0900 Last data filed at 09/14/2018 0600 Gross per 24 hour  Intake 700 ml  Output 1975 ml  Net -1275 ml   Last 3 Weights 09/14/2018 09/13/2018 09/12/2018  Weight (lbs) 179 lb 184 lb 1.4 oz 183 lb 10.3 oz  Weight (kg) 81.194 kg 83.5 kg 83.3 kg      Telemetry    NSR with 1st degree AV block and frequent Wenckebach- Personally Reviewed  ECG    Last EKG 3/14, sinus rhythm with low voltage - Personally Reviewed  Physical Exam    GEN: No acute distress.   Neck: No JVD Cardiac: RRR, no murmurs, rubs, or gallops.  Respiratory: Clear to auscultation bilaterally. GI: Soft, nontender, non-distended  MS: No edema; No deformity. Neuro:  Nonfocal  Psych: Normal affect   Labs    Chemistry Recent Labs  Lab 09/11/18 0537 09/12/18 0235 09/13/18 0228 09/13/18 1411 09/14/18 0511  NA 137 137 138  --  137  K 3.2* 3.3* 2.9* 4.2 3.0*  CL 104 102 98  --  102  CO2 24 24 28   --  28  GLUCOSE 94 107* 104*  --  98  BUN 36* 36* 39*  --  39*  CREATININE 1.20 1.09 1.03  --  0.95  CALCIUM 9.0 9.4 9.5  --  9.5  PROT 5.7* 6.3* 6.5  --   --   ALBUMIN 2.3* 2.3* 2.2*  --   --   AST 33 35 41  --   --   ALT 30 31 31   --   --   ALKPHOS 103 110 126  --   --   BILITOT 5.8* 6.7* 6.2*  --   --  GFRNONAA >60 >60 >60  --  >60  GFRAA >60 >60 >60  --  >60  ANIONGAP 9 11 12   --  7     Hematology Recent Labs  Lab 09/09/18 1549 09/11/18 0924 09/12/18 0235  WBC 8.6 7.8 7.4  RBC 4.81 4.18* 4.33  HGB 15.9 14.1 14.6  HCT 50.5 43.2 44.5  MCV 105.0* 103.3* 102.8*  MCH 33.1 33.7 33.7  MCHC 31.5 32.6 32.8  RDW 14.3 14.0 13.8  PLT 119* 96* 111*    Cardiac Enzymes Recent Labs  Lab 09/07/18 1935 09/08/18 0108 09/08/18 0704  TROPONINI 0.11* 0.19* 0.18*    Recent Labs  Lab 09/07/18 1615  TROPIPOC 0.28*     BNP Recent Labs  Lab 09/07/18 1528 09/08/18 1124  BNP 1,691.4* 1,619.6*     DDimer No results for input(s): DDIMER in the last 168 hours.   Radiology    No results found.  Cardiac Studies   Echo 09/08/2018  1. The left ventricle has moderately reduced systolic function, with an ejection fraction of 35-40%. The cavity size was normal. There is severely increased left ventricular wall thickness. Left ventricular diastolic Doppler parameters are  indeterminate. Left ventricular diffuse hypokinesis.  2. The right ventricle has moderately reduced systolic function. The cavity was normal. There is mildly increased  right ventricular wall thickness.  3. Left atrial size was moderately dilated.  4. Mitral valve regurgitation is moderate by color flow Doppler.  5. Tricuspid valve regurgitation is moderate.  6. Mild sclerosis of the aortic valve. Aortic valve regurgitation is trivial by color flow Doppler.  7. The inferior vena cava was normal in size with <50% respiratory variability.  8. Trivial pericardial effusion primarly around the right atrium.  9. Moderate pleural effusion. 10. When compared to the prior study: The ejection fraction has decreased slightly. Mitral and tricuspid regurgitation have increase slightly by visual estimate.  Patient Profile     73 y.o. male with chronic systolic and diastolic heart failure, hypertension, hyperlipidemia, CAD s/p RCA PCI (2002), and hepatic steatosis here with acute on chronic systolic and diastolic heart failure and sepsis 2/2 cellulitis in the setting of medication non-compliance  Assessment & Plan    1. Acute on chronic combined systolic and diastolic heart failure  - EF decreased from 45-50%% to 35% by echo. Myoview on 01/22/2018 showed no ischemia.   - although he appears to be euvolemic at this point, weigh 179 lbs, however back in January his weight was 169 during ED visit on 1/6 (unclear how accurate this weight was, as he was 181 lbs during visit with Dr. [name], dry weight at the time was felt to be around 176)  - consider diurese 1 more day, than switch to PO torsemide 40mg  BID. IV lasix held this morning due to low BP. Maybe able to be discharged tomorrow.   2. Mixed cardiogenic and septic shock: on midodrine BID.   - in the setting of cellulitis  3. AKI: baseline renal function stable.   4. CAD s/p PCI  5. HLD: h/o elevated CPKs after stopping statin.   6. Hypokalemia: being supplemented.   7. 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 Avoid AV nodal blocking agent      For questions or updates,  please contact [facility] Please consult [email or web address] for contact info under        Signed, Azalee Course, PA  09/14/2018, 9:00 AM

## 2018-09-15 ENCOUNTER — Inpatient Hospital Stay (HOSPITAL_COMMUNITY): Payer: Medicare Other

## 2018-09-15 DIAGNOSIS — I493 Ventricular premature depolarization: Secondary | ICD-10-CM

## 2018-09-15 LAB — BASIC METABOLIC PANEL
Anion gap: 9 (ref 5–15)
BUN: 36 mg/dL — ABNORMAL HIGH (ref 8–23)
CO2: 29 mmol/L (ref 22–32)
Calcium: 9.4 mg/dL (ref 8.9–10.3)
Chloride: 98 mmol/L (ref 98–111)
Creatinine, Ser: 0.91 mg/dL (ref 0.61–1.24)
GFR calc Af Amer: >60 mL/min (ref >60)
GFR calc non Af Amer: >60 mL/min (ref >60)
Glucose, Bld: 116 mg/dL — ABNORMAL HIGH (ref 70–99)
Potassium: 3.6 mmol/L (ref 3.5–5.1)
Sodium: 136 mmol/L (ref 135–145)

## 2018-09-15 MED ORDER — TORSEMIDE 20 MG PO TABS
20.0000 mg | ORAL_TABLET | Freq: Two times a day (BID) | ORAL | Status: DC
  Administered 2018-09-15 (×2): 20 mg via ORAL
  Filled 2018-09-15 (×3): qty 1

## 2018-09-15 NOTE — Progress Notes (Addendum)
Progress Note  Patient Name: Stanley [name] Date of Encounter: 09/15/2018  Primary Cardiologist: [name], MD    Subjective   73 yo with acute on chronic combined CHF EF 35% Has been noncompliant with meds.  Has diuresed 4.8 liters so far this admission   Inpatient Medications    Scheduled Meds: . aspirin EC  81 mg Oral Daily  . enoxaparin (LOVENOX) injection  40 mg Subcutaneous Q24H  . feeding supplement (ENSURE ENLIVE)  237 mL Oral BID BM  . feeding supplement (PRO-STAT SUGAR FREE 64)  30 mL Oral BID  . furosemide  40 mg Intravenous Q8H  . guaiFENesin  600 mg Oral BID  . latanoprost  1 drop Both Eyes QHS  . mouth rinse  15 mL Mouth Rinse BID  . midodrine  5 mg Oral BID WC  . multivitamin with minerals  1 tablet Oral Daily  . mupirocin cream   Topical Daily  . pantoprazole  80 mg Oral Daily  . sodium chloride flush  3 mL Intravenous Q12H   Continuous Infusions: . sodium chloride    . sodium chloride     PRN Meds: sodium chloride, [CANCELED] Place/Maintain arterial line **AND** sodium chloride, acetaminophen **OR** acetaminophen, colchicine, levalbuterol, phenol, sodium chloride flush   Vital Signs    Vitals:   09/14/18 0700 09/14/18 1330 09/14/18 2107 09/15/18 0512  BP: (!) 89/75 95/78 101/75 103/68  Pulse: 93 89 86 (!) 103  Resp:  13 20 18   Temp:  98.6 F (37 C) 97.7 F (36.5 C) 97.6 F (36.4 C)  TempSrc:  Oral Oral   SpO2:  93% 98% 91%  Weight: 81.2 kg   80.1 kg  Height:        Intake/Output Summary (Last 24 hours) at 09/15/2018 3212 Last data filed at 09/15/2018 0600 Gross per 24 hour  Intake 480 ml  Output 1450 ml  Net -970 ml   Last 3 Weights 09/15/2018 09/14/2018 09/13/2018  Weight (lbs) 176 lb 9.4 oz 179 lb 184 lb 1.4 oz  Weight (kg) 80.1 kg 81.194 kg 83.5 kg      Telemetry    NSR - Personally Reviewed  ECG      - Personally Reviewed  Physical Exam   GEN: pleasant , elderly man.    Neck: No JVD Cardiac: RRR, no murmurs, rubs,  or gallops.  Respiratory: Clear to auscultation bilaterally. GI: Soft, nontender, non-distended  MS:  1-2+ edema,   Lower ext wrapped. Neuro:  Nonfocal  Psych: Normal affect   Labs    Chemistry Recent Labs  Lab 09/12/18 0235 09/13/18 0228 09/13/18 1411 09/14/18 0511 09/14/18 1043  NA 137 138  --  137  --   K 3.3* 2.9* 4.2 3.0*  --   CL 102 98  --  102  --   CO2 24 28  --  28  --   GLUCOSE 107* 104*  --  98  --   BUN 36* 39*  --  39*  --   CREATININE 1.09 1.03  --  0.95  --   CALCIUM 9.4 9.5  --  9.5  --   PROT 6.3* 6.5  --   --  6.3*  ALBUMIN 2.3* 2.2*  --   --  2.4*  AST 35 41  --   --  53*  ALT 31 31  --   --  35  ALKPHOS 110 126  --   --  122  BILITOT 6.7* 6.2*  --   --  7.9*  GFRNONAA >60 >60  --  >60  --   GFRAA >60 >60  --  >60  --   ANIONGAP 11 12  --  7  --      Hematology Recent Labs  Lab 09/09/18 1549 09/11/18 0924 09/12/18 0235  WBC 8.6 7.8 7.4  RBC 4.81 4.18* 4.33  HGB 15.9 14.1 14.6  HCT 50.5 43.2 44.5  MCV 105.0* 103.3* 102.8*  MCH 33.1 33.7 33.7  MCHC 31.5 32.6 32.8  RDW 14.3 14.0 13.8  PLT 119* 96* 111*    Cardiac EnzymesNo results for input(s): TROPONINI in the last 168 hours. No results for input(s): TROPIPOC in the last 168 hours.   BNP Recent Labs  Lab 09/08/18 1124  BNP 1,619.6*     DDimer No results for input(s): DDIMER in the last 168 hours.   Radiology    No results found.  Cardiac Studies      Patient Profile     73 y.o. male with acute on chronic combined CHF  Assessment & Plan    1.  A/C combined CHF:   Has diuresed 4.8 liters so far.  Lungs are clear hes feeling close to baseline  Change the IV lasix to torsemide 20 mg BID -  Could possibly reduce to 20 mg a day as op . Creatinine is normal  2.  Orthostatic hypotension - on midodrine  3. CAD - no angina,  Cont. ASA 81   4.  PVCs:   Stable,  Replace potassium ( primary team is managing )      For questions or updates, please contact [facility]  Please consult [email or web address] for contact info under        Signed, Kristeen Miss, MD  09/15/2018, 8:21 AM
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
## 2018-09-15 NOTE — Plan of Care (Signed)
  Problem: Health Behavior/Discharge Planning: Goal: Ability to manage health-related needs will improve Outcome: Progressing   Problem: Clinical Measurements: Goal: Ability to maintain clinical measurements within normal limits will improve Outcome: Progressing Goal: Will remain free from infection Outcome: Progressing Goal: Diagnostic test results will improve Outcome: Progressing Goal: Respiratory complications will improve Outcome: Progressing Goal: Cardiovascular complication will be avoided Outcome: Progressing   Problem: Activity: Goal: Risk for activity intolerance will decrease Outcome: Progressing   Problem: Nutrition: Goal: Adequate nutrition will be maintained Outcome: Progressing   Problem: Education: Goal: Ability to demonstrate management of disease process will improve Outcome: Progressing Goal: Ability to verbalize understanding of medication therapies will improve Outcome: Progressing   Problem: Activity: Goal: Capacity to carry out activities will improve Outcome: Progressing   Problem: Cardiac: Goal: Ability to achieve and maintain adequate cardiopulmonary perfusion will improve Outcome: Progressing

## 2018-09-15 NOTE — Progress Notes (Signed)
PROGRESS NOTE    Stanley [name]  ZOX:[id number] DOB: 23-May-1946 DOA: 09/07/2018 PCP: [name], MD   Brief Narrative: Stanley [name] is a 73 y.o. malewith medical history significant of CHF, HLD, CAD, and PADhistory ofhyperparathyroidism prediabetes, hepatic steatosis. He presented secondary to shortness of breath and fatigue in addition to LE swelling, found to have an acute heart failure exacerbation and cellulitis.   Assessment & Plan:   Principal Problem:   Acute on chronic combined systolic and diastolic CHF (congestive heart failure) (HCC) Active Problems:   Essential hypertension, benign   Hyperlipidemia   Elevated troponin   Thrombocytopenia (HCC)   CAD (coronary artery disease), native coronary artery   Peripheral arterial disease (HCC)   Essential hypertension   CKD (chronic kidney disease) stage 3, GFR 30-59 ml/min (HCC)   Anasarca   Elevated LFTs   CHF exacerbation (HCC)   Prolonged QT interval   Cellulitis   Lactic acidosis   Acute on chronic combined systolic and diastolic heart failure On room air. Significant for LE edema. Cardiology on board. EF of 35-40%. Currently being diuresed. Not on a beta blocker or ACEi/ARB secondary to hypotension and heart block -Cardiology recommendations: Switch to torsemide today. midodrine  Hypotension Secondary to aggressive diuresis. Started on midodrine.  CAD/PAD Hyperlipidemia -Continue aspirin  CKD stage III Stable during diuresis  Elevated LFTs Hyperbilirubinemia Patient currently is being managed/worked up for hepatic dysfunction. He has persistently high bilirubin. Follows with [facility] as an outpatient. MELD score of 17 -GI consult  Prolonged QTc Mild. Has had short runs of V-tach. Currently on Lasix. Cardiology on board.  Sepsis In setting of cellulitis per chart review. Empirically on vancomycin and ceftriaxone. Blood cultures no growth. On exam, no evidence of cellulitis today. I did not  see patient on presentation, however. Completed antibiotic course.  Hypokalemia Secondary to lasix. Given supplementation -Continue potassium supplementation -BMP pending  Dark urine Hematuria vs bilirubinemia. Previous urinalysis suggests hematuria cause. Repeat urinalysis unremarkable.   DVT prophylaxis: Lovenox Code Status:   Code Status: Full Code Family Communication: None at bedside Disposition Plan: Discharge to SNF. Likely discharge in 24 hours.   Consultants:   Cardiology  Procedures:   None  Antimicrobials:  Vancomycin  Zosyn  Ceftriaxone  Cefdinir   Subjective: No concerns today  Objective: Vitals:   09/14/18 2107 09/15/18 0512 09/15/18 0900 09/15/18 0932  BP: 101/75 103/68 94/61 106/75  Pulse: 86 (!) 103 70 72  Resp: 20 18    Temp: 97.7 F (36.5 C) 97.6 F (36.4 C)    TempSrc: Oral     SpO2: 98% 91%    Weight:  80.1 kg    Height:        Intake/Output Summary (Last 24 hours) at 09/15/2018 1044 Last data filed at 09/15/2018 0600 Gross per 24 hour  Intake 480 ml  Output 1450 ml  Net -970 ml   Filed Weights   09/13/18 0500 09/14/18 0700 09/15/18 0512  Weight: 83.5 kg 81.2 kg 80.1 kg    Examination:  General exam: Appears calm and comfortable Respiratory system: mild rales. Respiratory effort normal. Cardiovascular system: S1 & S2 heard, RRR. No murmurs, rubs, gallops or clicks. Gastrointestinal system: Abdomen is nondistended, soft and nontender. No organomegaly or masses felt. Normal bowel sounds heard. Central nervous system: Alert and oriented. No focal neurological deficits. Extremities: 1+ LE edema. No calf tenderness Skin: No cyanosis. LE wounds wrapped in gauze Psychiatry: Judgement and insight appear normal. Mood &  affect appropriate.    Data Reviewed: I have personally reviewed following labs and imaging studies  CBC: Recent Labs  Lab 09/09/18 0422 09/09/18 1549 09/11/18 0924 09/12/18 0235  WBC 9.8 8.6 7.8 7.4   NEUTROABS  --   --   --  5.6  HGB 14.7 15.9 14.1 14.6  HCT 46.2 50.5 43.2 44.5  MCV 106.5* 105.0* 103.3* 102.8*  PLT 105* 119* 96* 111*   Basic Metabolic Panel: Recent Labs  Lab 09/10/18 0347 09/11/18 0537 09/12/18 0235 09/13/18 0228 09/13/18 1411 09/14/18 0511  NA 137 137 137 138  --  137  K 3.8 3.2* 3.3* 2.9* 4.2 3.0*  CL 104 104 102 98  --  102  CO2 24 24 24 28   --  28  GLUCOSE 119* 94 107* 104*  --  98  BUN 38* 36* 36* 39*  --  39*  CREATININE 1.28* 1.20 1.09 1.03  --  0.95  CALCIUM 9.4 9.0 9.4 9.5  --  9.5  MG 1.9  --  1.6* 1.7  --   --   PHOS  --   --  2.1*  --   --   --    GFR: Estimated Creatinine Clearance: 69.9 mL/min (by C-G formula based on SCr of 0.95 mg/dL). Liver Function Tests: Recent Labs  Lab 09/10/18 0347 09/11/18 0537 09/12/18 0235 09/13/18 0228 09/14/18 1043  AST 41 33 35 41 53*  ALT 36 30 31 31  35  ALKPHOS 116 103 110 126 122  BILITOT 5.8* 5.8* 6.7* 6.2* 7.9*  PROT 6.3* 5.7* 6.3* 6.5 6.3*  ALBUMIN 2.5* 2.3* 2.3* 2.2* 2.4*   No results for input(s): LIPASE, AMYLASE in the last 168 hours. No results for input(s): AMMONIA in the last 168 hours. Coagulation Profile: Recent Labs  Lab 09/14/18 1043  INR 1.3*   Cardiac Enzymes: No results for input(s): CKTOTAL, CKMB, CKMBINDEX, TROPONINI in the last 168 hours. BNP (last 3 results) Recent Labs    12/05/17 0909 12/19/17 1236 01/31/18 1139  PROBNP 1,776* 1,961* 2,225*   HbA1C: No results for input(s): HGBA1C in the last 72 hours. CBG: No results for input(s): GLUCAP in the last 168 hours. Lipid Profile: No results for input(s): CHOL, HDL, LDLCALC, TRIG, CHOLHDL, LDLDIRECT in the last 72 hours. Thyroid Function Tests: No results for input(s): TSH, T4TOTAL, FREET4, T3FREE, THYROIDAB in the last 72 hours. Anemia Panel: No results for input(s): VITAMINB12, FOLATE, FERRITIN, TIBC, IRON, RETICCTPCT in the last 72 hours. Sepsis Labs: Recent Labs  Lab 09/09/18 0430 09/09/18 0837 09/09/18  1549 09/09/18 1901  LATICACIDVEN 2.4* 2.2* 3.2* 3.4*    Recent Results (from the past 240 hour(s))  Blood culture (routine x 2)     Status: None   Collection Time: 09/07/18  3:17 PM  Result Value Ref Range Status   Specimen Description   Final    BLOOD RIGHT WRIST Performed at [facility], [address] [place], [place] [address]    Special Requests   Final    BOTTLES DRAWN AEROBIC ONLY Blood Culture adequate volume Performed at [facility], [address], [place], [place] [address]    Culture   Final    NO GROWTH 5 DAYS Performed at [facility], [address], [place], [place] [address]    Report Status 09/12/2018 FINAL  Final  Blood culture (routine x 2)     Status: None   Collection Time: 09/07/18  3:22 PM  Result Value  Ref Range Status   Specimen Description   Final    BLOOD RIGHT ANTECUBITAL Performed at [facility], [address], [place], [place] [address]    Special Requests   Final    BOTTLES DRAWN AEROBIC AND ANAEROBIC Blood Culture results may not be optimal due to an inadequate volume of blood received in culture bottles Performed at [facility], [address], [place], [place] [address]    Culture   Final    NO GROWTH 5 DAYS Performed at [facility], [address], [place], [place] [address]    Report Status 09/12/2018 FINAL  Final  MRSA PCR Screening     Status: None   Collection Time: 09/07/18  8:38 PM  Result Value Ref Range Status   MRSA by PCR NEGATIVE NEGATIVE Final    Comment:        The GeneXpert MRSA Assay (FDA approved for NASAL specimens only), is one component of a comprehensive MRSA colonization surveillance program. It is not intended to diagnose MRSA infection nor to guide or monitor treatment for MRSA infections. Performed at [facility], [address] [place], [place] [address]          Radiology Studies: No results  found.      Scheduled Meds: . aspirin EC  81 mg Oral Daily  . enoxaparin (LOVENOX) injection  40 mg Subcutaneous Q24H  . feeding supplement (ENSURE ENLIVE)  237 mL Oral BID BM  . feeding supplement (PRO-STAT SUGAR FREE 64)  30 mL Oral BID  . guaiFENesin  600 mg Oral BID  . latanoprost  1 drop Both Eyes QHS  . mouth rinse  15 mL Mouth Rinse BID  . midodrine  5 mg Oral BID WC  . multivitamin with minerals  1 tablet Oral Daily  . mupirocin cream   Topical Daily  . pantoprazole  80 mg Oral Daily  . sodium chloride flush  3 mL Intravenous Q12H  . torsemide  20 mg Oral BID   Continuous Infusions: . sodium chloride    . sodium chloride       LOS: 7 days     Jacquelin Hawking, MD [facility] 09/15/2018, 10:44 AM  If 7PM-7AM, please contact night-coverage [email or web address]

## 2018-09-16 LAB — BASIC METABOLIC PANEL
Anion gap: 11 (ref 5–15)
BUN: 36 mg/dL — ABNORMAL HIGH (ref 8–23)
CO2: 29 mmol/L (ref 22–32)
Calcium: 9.3 mg/dL (ref 8.9–10.3)
Chloride: 98 mmol/L (ref 98–111)
Creatinine, Ser: 0.9 mg/dL (ref 0.61–1.24)
GFR calc Af Amer: >60 mL/min (ref >60)
GFR calc non Af Amer: >60 mL/min (ref >60)
Glucose, Bld: 96 mg/dL (ref 70–99)
Potassium: 2.8 mmol/L — ABNORMAL LOW (ref 3.5–5.1)
Sodium: 138 mmol/L (ref 135–145)

## 2018-09-16 MED ORDER — POTASSIUM CHLORIDE CRYS ER 20 MEQ PO TBCR
40.0000 meq | EXTENDED_RELEASE_TABLET | Freq: Once | ORAL | Status: AC
  Administered 2018-09-16: 40 meq via ORAL
  Filled 2018-09-16: qty 2

## 2018-09-16 MED ORDER — TORSEMIDE 20 MG PO TABS
20.0000 mg | ORAL_TABLET | Freq: Every day | ORAL | Status: DC
  Administered 2018-09-17: 20 mg via ORAL
  Filled 2018-09-16: qty 1

## 2018-09-16 MED ORDER — SPIRONOLACTONE 12.5 MG HALF TABLET
12.5000 mg | ORAL_TABLET | Freq: Every day | ORAL | Status: DC
  Administered 2018-09-16 – 2018-09-17 (×2): 12.5 mg via ORAL
  Filled 2018-09-16 (×2): qty 1
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
## 2018-09-16 NOTE — Plan of Care (Signed)
  Problem: Health Behavior/Discharge Planning: Goal: Ability to manage health-related needs will improve Outcome: Progressing   Problem: Clinical Measurements: Goal: Ability to maintain clinical measurements within normal limits will improve Outcome: Progressing Goal: Will remain free from infection Outcome: Progressing Goal: Diagnostic test results will improve Outcome: Progressing Goal: Respiratory complications will improve Outcome: Progressing Goal: Cardiovascular complication will be avoided Outcome: Progressing   Problem: Activity: Goal: Risk for activity intolerance will decrease Outcome: Progressing   Problem: Nutrition: Goal: Adequate nutrition will be maintained Outcome: Progressing   Problem: Education: Goal: Ability to demonstrate management of disease process will improve Outcome: Progressing Goal: Ability to verbalize understanding of medication therapies will improve Outcome: Progressing   Problem: Activity: Goal: Capacity to carry out activities will improve Outcome: Progressing   Problem: Cardiac: Goal: Ability to achieve and maintain adequate cardiopulmonary perfusion will improve Outcome: Progressing   
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
## 2018-09-17 ENCOUNTER — Telehealth: Payer: Self-pay | Admitting: Cardiology

## 2018-09-17 DIAGNOSIS — I5023 Acute on chronic systolic (congestive) heart failure: Secondary | ICD-10-CM

## 2018-09-17 LAB — BASIC METABOLIC PANEL
Anion gap: 8 (ref 5–15)
BUN: 40 mg/dL — ABNORMAL HIGH (ref 8–23)
CO2: 29 mmol/L (ref 22–32)
Calcium: 9.5 mg/dL (ref 8.9–10.3)
Chloride: 101 mmol/L (ref 98–111)
Creatinine, Ser: 0.91 mg/dL (ref 0.61–1.24)
GFR calc Af Amer: >60 mL/min (ref >60)
GFR calc non Af Amer: >60 mL/min (ref >60)
Glucose, Bld: 107 mg/dL — ABNORMAL HIGH (ref 70–99)
Potassium: 3.9 mmol/L (ref 3.5–5.1)
SODIUM: 138 mmol/L (ref 135–145)

## 2018-09-17 MED ORDER — TORSEMIDE 20 MG PO TABS: 20.0000 mg | ORAL_TABLET | Freq: Every day | ORAL | Status: DC

## 2018-09-17 MED ORDER — ENSURE ENLIVE PO LIQD: 237.0000 mL | Freq: Two times a day (BID) | ORAL | Status: AC

## 2018-09-17 MED ORDER — SPIRONOLACTONE 25 MG PO TABS: 12.5000 mg | ORAL_TABLET | Freq: Every day | ORAL | Status: DC

## 2018-09-17 MED ORDER — MIDODRINE HCL 5 MG PO TABS: 5.0000 mg | ORAL_TABLET | Freq: Two times a day (BID) | ORAL | Status: DC

## 2018-09-17 MED ORDER — MUPIROCIN CALCIUM 2 % EX CREA: TOPICAL_CREAM | Freq: Every day | CUTANEOUS | Status: AC

## 2018-09-17 MED ORDER — PRO-STAT SUGAR FREE PO LIQD: 30.0000 mL | Freq: Two times a day (BID) | ORAL | Status: DC

## 2018-09-17 NOTE — TOC Progression Note (Signed)
Transition of Care [facility]) - Progression Note    Patient Details  Name: Stanley [name] MRN: [id number] Date of Birth: 1945/12/29  Transition of Care [facility] CM/SW Contact  Ronnald Shedden, Olegario Messier, RN Phone Number: 09/17/2018, 3:00 PM  Clinical Narrative:  Awaiting call back from [facility] cardiology about med-evolocumab injections-will they provide the med,& transport to office-[facility] needs to know prior d/c to [facility].     Expected Discharge Plan: Skilled Nursing Facility Barriers to Discharge: No Barriers Identified  Expected Discharge Plan and Services Expected Discharge Plan: Skilled Nursing Facility   Discharge Planning Services: CM Consult Post Acute Care Choice: Durable Medical Equipment(rw) Living arrangements for the past 2 months: Single Family Home Expected Discharge Date: 09/17/18                         Social Determinants of Health (SDOH) Interventions    Readmission Risk Interventions No flowsheet data found.

## 2018-09-17 NOTE — Telephone Encounter (Signed)
Left the patient, a detailed voicemail that described that the research project is at [facility], not our office and to contact them directly.

## 2018-09-17 NOTE — Progress Notes (Signed)
Physical Therapy Treatment Patient Details Name: Stanley [name] MRN: [id number] DOB: 05-07-1946 Today's Date: 09/17/2018    History of Present Illness 73 y.o. male with medical history significant of CHF, HLD, CAD, PAD, hyperparathyroidism prediabetes, and hepatic steatosis. Presenting with SOB and blisters at BLEs    PT Comments    Pt with short ambulation distance in room this session, limited by tachypnea and desat to 89%. Pt not able to tolerate LE exercises this session. Pt with increased work of breathing this session, RN notified. PT to continue to follow acutely. PT recommending SNF, pt may not be able to tolerate CIR at this time.     Follow Up Recommendations  SNF;Supervision/Assistance - 24 hour     Equipment Recommendations  None recommended by PT    Recommendations for Other Services       Precautions / Restrictions Precautions Precautions: Fall Precaution Comments: multiple  blisters on legs, SOB Restrictions Weight Bearing Restrictions: No    Mobility  Bed Mobility Overal bed mobility: Needs Assistance Bed Mobility: Supine to Sit     Supine to sit: Mod assist;+2 for physical assistance     General bed mobility comments: Mod assist +2 for LE lifting and translation to EOB, scooting to EOB with use of bed pad. Pt instructed to lean forwards to offweight gluteal region to aid in scooting.   Transfers Overall transfer level: Needs assistance Equipment used: Rolling walker (2 wheeled) Transfers: Sit to/from [employer] Sit to Stand: Mod assist;+2 safety/equipment;From elevated surface;+2 physical assistance Stand pivot transfers: Mod assist;+2 physical assistance;From elevated surface;+2 safety/equipment       General transfer comment: Mod assist +2 for initial power up, hip extension to come to erect standing, and steadying once standing. Stand pivot with assist for steadying, guiding to BSC, and slow lower to [facility]. Verbal cuing for hand  placement throughout..  Ambulation/Gait Ambulation/Gait assistance: Mod assist;+2 physical assistance;+2 safety/equipment Gait Distance (Feet): 5 Feet Assistive device: Rolling walker (2 wheeled) Gait Pattern/deviations: Step-to pattern;Trunk flexed;Decreased step length - left;Decreased step length - right;Antalgic Gait velocity: very decr   General Gait Details: Mod assist +2 for directing RW, steadying. Frequent verbal cuing for placement in RW as pt with tendency place RW far out in front of him.    Stairs             Wheelchair Mobility    Modified Rankin (Stroke Patients Only)       Balance Overall balance assessment: Needs assistance   Sitting balance-Leahy Scale: Fair Sitting balance - Comments: able to sit EOB without PT support, holding onto bedrail for steadying   Standing balance support: Bilateral upper extremity supported;During functional activity Standing balance-Leahy Scale: Poor Standing balance comment: reliant on RW and PT                            Cognition Arousal/Alertness: Awake/alert Behavior During Therapy: WFL for tasks assessed/performed Overall Cognitive Status: Within Functional Limits for tasks assessed                                        Exercises General Exercises - Lower Extremity Long Arc Quad: AROM;Right;5 reps;Seated    General Comments General comments (skin integrity, edema, etc.): Pt with tachypnea in the 30s with moving from supine to sit, post-ambulation. Pt with SPO2 89% post-ambulation to recliner, RN  notified.      Pertinent Vitals/Pain Pain Assessment: 0-10 Pain Score: 4  Pain Location: bilateral lower legs and feet  Pain Descriptors / Indicators: Sore;Grimacing Pain Intervention(s): Monitored during session;Repositioned;Limited activity within patient's tolerance    Home Living                      Prior Function            PT Goals (current goals can now be found  in the care plan section) Acute Rehab PT Goals Patient Stated Goal: "Get stronger" PT Goal Formulation: With patient Time For Goal Achievement: 09/22/18 Potential to Achieve Goals: Good Progress towards PT goals: Progressing toward goals    Frequency    Min 2X/week      PT Plan Current plan remains appropriate    Co-evaluation              AM-PAC PT "6 Clicks" Mobility   Outcome Measure  Help needed turning from your back to your side while in a flat bed without using bedrails?: A Lot Help needed moving from lying on your back to sitting on the side of a flat bed without using bedrails?: A Lot Help needed moving to and from a bed to a chair (including a wheelchair)?: A Lot Help needed standing up from a chair using your arms (e.g., wheelchair or bedside chair)?: A Lot Help needed to walk in hospital room?: A Lot Help needed climbing 3-5 steps with a railing? : Total 6 Click Score: 11    End of Session Equipment Utilized During Treatment: Gait belt Activity Tolerance: Patient limited by fatigue Patient left: in chair;with call bell/phone within reach;with chair alarm set Nurse Communication: Mobility status;Other (comment)(sats and tachypnea) PT Visit Diagnosis: Unsteadiness on feet (R26.81);Other abnormalities of gait and mobility (R26.89)     Time: 8295-6213 PT Time Calculation (min) (ACUTE ONLY): 27 min  Charges:  $Gait Training: 8-22 mins $Therapeutic Activity: 8-22 mins                     Nicola Police, PT Acute Rehabilitation Services Pager [phone]  Office [phone]    Tyrone Apple D Despina Hidden 09/17/2018, 12:46 PM

## 2018-09-17 NOTE — TOC Transition Note (Signed)
Transition of Care [facility]) - CM/SW Discharge Note   Patient Details  Name: Stanley [name] MRN: [id number] Date of Birth: [birth year]  [facility] CM/SW Contact:  Lanier Clam, RN Phone Number: 09/17/2018, 1:47 PM   Clinical Narrative:   Patient to d/c to [facility] for non emergency transport called for 4p pick -Nurse to call report to [phone] rm#[id number]. Faxed d/c summary,d/c orders, SNF transfer report w/confirmation to [facility]. No further CM needs.    Final next level of care: Skilled Nursing Facility Barriers to Discharge: No Barriers Identified   Patient Goals and CMS Choice Patient states their goals for this hospitalization and ongoing recovery are:: (get stronger) CMS Medicare.gov Compare Post Acute Care list provided to:: Patient Choice offered to / list presented to : Patient  Discharge Placement PASRR number recieved: (yes)            Patient chooses bed at: ([facility]) Patient to be transferred to facility by: Sharin Mons) Name of family member notified: Caroline Sauger [phone]) Patient and family notified of of transfer: (yes)  Discharge Plan and Services   Discharge Planning Services: CM Consult Post Acute Care Choice: Durable Medical Equipment(rw)                    Social Determinants of Health (SDOH) Interventions     Readmission Risk Interventions No flowsheet data found.

## 2018-09-17 NOTE — Telephone Encounter (Signed)
New Message   Patient is being discharged today and Olegario Messier is calling from the hospital because she needs to know if we will be providing patient with the medication Evolocumab and providing transportation for the patient.

## 2018-09-17 NOTE — Telephone Encounter (Signed)
He is in a research study = this needs to be called to research team to find out what to do - please sent to Remus Loffler, PharmD

## 2018-09-17 NOTE — TOC Progression Note (Signed)
Transition of Care [facility]) - Progression Note    Patient Details  Name: Stanley [name] MRN: [id number] Date of Birth: March 14, 1946  [facility] of [facility]) CM/SW Contact  Shiane Wenberg, Olegario Messier, RN Phone Number: 09/17/2018, 12:20 PM  Clinical Narrative: Noted per attending/cardio-not medically stable for d/c today.Received call from patient's friend asking about status. TC from [facility]-Janie Holden(rep)-auth started on 3/20-will expire on 3/26-will continue to keep [facility] updated on progress, & d/c date.      Expected Discharge Plan: Skilled Nursing Facility Barriers to Discharge: No Barriers Identified  Expected Discharge Plan and Services Expected Discharge Plan: Skilled Nursing Facility   Discharge Planning Services: CM Consult Post Acute Care Choice: Durable Medical Equipment(rw) Living arrangements for the past 2 months: Single Family Home Expected Discharge Date: 09/17/18                         Social Determinants of Health (SDOH) Interventions    Readmission Risk Interventions No flowsheet data found.
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
## 2018-09-17 NOTE — Care Management Important Message (Signed)
Important Message  Patient Details  Name: Stanley [name] MRN: [id number] Date of Birth: 1946/06/09   Medicare Important Message Given:  Yes    Caren Macadam 09/17/2018, 1:05 PMImportant Message  Patient Details  Name: Stanley [name] MRN: [id number] Date of Birth: [birth year]   Medicare Important Message Given:  Yes    Caren Macadam 09/17/2018, 1:04 PM

## 2018-09-17 NOTE — Telephone Encounter (Signed)
Pt needs to follow up with research team at [facility] - they should be providing his study medication.

## 2018-09-17 NOTE — Progress Notes (Signed)
Pt discharged from the unit via ptar. Discharge instructions were reviewed and called to [name] and given to RN. No questions or concerns at this time.

## 2018-09-17 NOTE — TOC Transition Note (Signed)
Transition of Care [facility]) - CM/SW Discharge Note   Patient Details  Name: Stanley [name] MRN: [id number] Date of Birth: Apr 29, 1946  [facility] CM/SW Contact:  Lanier Clam, RN Phone Number: 09/17/2018, 3:29 PM   Clinical Narrative: Received call back from [facility] cardiology office-Dr. [name] nurse- will manage the evolocumab injections @ the office. The [facility] will manage the transportation to the office on 09/24/18.CM has contacted [facility] rep Janie with the updted info-the are ready to accept patient. [facility] already contacted-Nsg aware of d/c to [facility]. No further CM needs.      Final next level of care: Skilled Nursing Facility Barriers to Discharge: No Barriers Identified   Patient Goals and CMS Choice Patient states their goals for this hospitalization and ongoing recovery are:: (get stronger) CMS Medicare.gov Compare Post Acute Care list provided to:: Patient Choice offered to / list presented to : Patient  Discharge Placement PASRR number recieved: (yes)            Patient chooses bed at: ([facility]) Patient to be transferred to facility by: [facility]) Name of family member notified: Caroline Sauger [phone]) Patient and family notified of of transfer: (yes)  Discharge Plan and Services   Discharge Planning Services: CM Consult Post Acute Care Choice: Durable Medical Equipment(rw)                    Social Determinants of Health (SDOH) Interventions     Readmission Risk Interventions No flowsheet data found.

## 2018-09-17 NOTE — Telephone Encounter (Signed)
New Message    Pt c/o medication issue:  1. Name of Medication: : evolocumab   2. How are you currently taking this medication (dosage and times per day  3. Are you having a reaction (difficulty breathing--STAT)?   4. What is your medication issue? Stanley [name] case Production designer, theatre/television/film with [facility] is calling in reference to patients new medication. She would like to provide additional details in reference to his upcoming appt on 3/30. Please call.

## 2018-09-18 ENCOUNTER — Telehealth: Payer: Self-pay | Admitting: *Deleted

## 2018-09-18 NOTE — Telephone Encounter (Signed)
Reading notes and saw that patient had called Drs office regarding study medication.  I called the patient's home number and spoke with a male who informed me that Stanley [name] is presently at [facility] rehab center.  I called his cell phone and left a message to give [place] a call regarding study medication.  Left office number as well and instructions to give [place] a call.

## 2018-09-19 ENCOUNTER — Telehealth: Payer: Self-pay

## 2018-09-19 NOTE — Telephone Encounter (Signed)
lpmtcb 3/25 

## 2018-09-19 NOTE — Telephone Encounter (Signed)
Faxed BMP orders to facility and informed Coralee North (nurse) that we will reschedule appointment.

## 2018-09-19 NOTE — Telephone Encounter (Signed)
-----   Message from Allayne Butcher, [place] sent at 09/18/2018  3:45 PM EDT ----- F/u is for [facility] f/u after hospitalization for acute CHF and hypokalemia. We should try to keep appt if no Covid symptoms. He will also need labs at f/u. If he has had recent viral symptoms/ fever, try to reschedule appointment in 2 weeks. If we need to reschedule in 2 weeks, we can do a tele visit in the interim.

## 2018-09-19 NOTE — Telephone Encounter (Signed)
-----   Message from Brittainy M Simmons, PA-C sent at 09/18/2018  3:45 PM EDT ----- F/u is for TOC f/u after hospitalization for acute CHF and hypokalemia. We should try to keep appt if no Covid symptoms. He will also need labs at f/u. If he has had recent viral symptoms/ fever, try to reschedule appointment in 2 weeks. If we need to reschedule in 2 weeks, we can do a tele visit in the interim.   

## 2018-09-19 NOTE — Telephone Encounter (Signed)
-----   Message from Allayne Butcher, [place] sent at 09/19/2018 11:48 AM EDT ----- Regarding: reschedule Given pt is currently in a SNF for rehab and stable from a cardiac symptom standpoint, I advised that we reschedule his office appointment to reduce risk of community spread of COVID19. He will need however labs obtained for medication monitoring and recent hypokalemia. He will need a BMP. We will send order to SNF to be completed there and will have them send results to [place] for review. We will reschedule on [facility] care team in 4-6 weeks. Priority 1.

## 2018-09-19 NOTE — Telephone Encounter (Signed)
  Patient relocated to [facility] post hospital visit.  Due to Covid 19, we have decided to reschedule appointment.      [name]

## 2018-09-24 ENCOUNTER — Ambulatory Visit: Payer: Medicare Other | Admitting: Cardiology

## 2018-09-26 ENCOUNTER — Inpatient Hospital Stay (HOSPITAL_COMMUNITY): Payer: Medicare Other

## 2018-09-26 ENCOUNTER — Emergency Department (HOSPITAL_COMMUNITY): Payer: Medicare Other

## 2018-09-26 ENCOUNTER — Encounter (HOSPITAL_COMMUNITY): Payer: Self-pay | Admitting: Emergency Medicine

## 2018-09-26 ENCOUNTER — Inpatient Hospital Stay (HOSPITAL_COMMUNITY)
Admission: EM | Admit: 2018-09-26 | Discharge: 2018-09-28 | DRG: 062 | Disposition: A | Discharge status: Skilled Nursing Facility | Payer: Medicare Other | Source: Skilled Nursing Facility | Attending: Neurology | Admitting: Neurology

## 2018-09-26 ENCOUNTER — Other Ambulatory Visit: Payer: Self-pay

## 2018-09-26 DIAGNOSIS — I6381 Other cerebral infarction due to occlusion or stenosis of small artery: Principal | ICD-10-CM | POA: Diagnosis present

## 2018-09-26 DIAGNOSIS — Z955 Presence of coronary angioplasty implant and graft: Secondary | ICD-10-CM | POA: Diagnosis not present

## 2018-09-26 DIAGNOSIS — Z9049 Acquired absence of other specified parts of digestive tract: Secondary | ICD-10-CM

## 2018-09-26 DIAGNOSIS — R4781 Slurred speech: Secondary | ICD-10-CM | POA: Diagnosis present

## 2018-09-26 DIAGNOSIS — R29706 NIHSS score 6: Secondary | ICD-10-CM | POA: Diagnosis present

## 2018-09-26 DIAGNOSIS — I639 Cerebral infarction, unspecified: Secondary | ICD-10-CM | POA: Diagnosis not present

## 2018-09-26 DIAGNOSIS — Z7982 Long term (current) use of aspirin: Secondary | ICD-10-CM | POA: Diagnosis not present

## 2018-09-26 DIAGNOSIS — E213 Hyperparathyroidism, unspecified: Secondary | ICD-10-CM | POA: Diagnosis present

## 2018-09-26 DIAGNOSIS — I6300 Cerebral infarction due to thrombosis of unspecified precerebral artery: Secondary | ICD-10-CM | POA: Diagnosis not present

## 2018-09-26 DIAGNOSIS — I110 Hypertensive heart disease with heart failure: Secondary | ICD-10-CM | POA: Diagnosis present

## 2018-09-26 DIAGNOSIS — H4310 Vitreous hemorrhage, unspecified eye: Secondary | ICD-10-CM | POA: Diagnosis present

## 2018-09-26 DIAGNOSIS — E1151 Type 2 diabetes mellitus with diabetic peripheral angiopathy without gangrene: Secondary | ICD-10-CM | POA: Diagnosis present

## 2018-09-26 DIAGNOSIS — I340 Nonrheumatic mitral (valve) insufficiency: Secondary | ICD-10-CM

## 2018-09-26 DIAGNOSIS — R069 Unspecified abnormalities of breathing: Secondary | ICD-10-CM

## 2018-09-26 DIAGNOSIS — R29810 Facial weakness: Secondary | ICD-10-CM | POA: Diagnosis present

## 2018-09-26 DIAGNOSIS — M67911 Unspecified disorder of synovium and tendon, right shoulder: Secondary | ICD-10-CM

## 2018-09-26 DIAGNOSIS — I2510 Atherosclerotic heart disease of native coronary artery without angina pectoris: Secondary | ICD-10-CM | POA: Diagnosis present

## 2018-09-26 DIAGNOSIS — Z7902 Long term (current) use of antithrombotics/antiplatelets: Secondary | ICD-10-CM

## 2018-09-26 DIAGNOSIS — Z79899 Other long term (current) drug therapy: Secondary | ICD-10-CM

## 2018-09-26 DIAGNOSIS — E785 Hyperlipidemia, unspecified: Secondary | ICD-10-CM | POA: Diagnosis present

## 2018-09-26 DIAGNOSIS — I5042 Chronic combined systolic (congestive) and diastolic (congestive) heart failure: Secondary | ICD-10-CM | POA: Diagnosis present

## 2018-09-26 DIAGNOSIS — I10 Essential (primary) hypertension: Secondary | ICD-10-CM | POA: Diagnosis present

## 2018-09-26 DIAGNOSIS — I361 Nonrheumatic tricuspid (valve) insufficiency: Secondary | ICD-10-CM

## 2018-09-26 DIAGNOSIS — Z951 Presence of aortocoronary bypass graft: Secondary | ICD-10-CM | POA: Diagnosis not present

## 2018-09-26 DIAGNOSIS — Z7951 Long term (current) use of inhaled steroids: Secondary | ICD-10-CM

## 2018-09-26 DIAGNOSIS — R471 Dysarthria and anarthria: Secondary | ICD-10-CM | POA: Diagnosis present

## 2018-09-26 DIAGNOSIS — H409 Unspecified glaucoma: Secondary | ICD-10-CM | POA: Diagnosis present

## 2018-09-26 DIAGNOSIS — Z8349 Family history of other endocrine, nutritional and metabolic diseases: Secondary | ICD-10-CM

## 2018-09-26 DIAGNOSIS — Z888 Allergy status to other drugs, medicaments and biological substances status: Secondary | ICD-10-CM

## 2018-09-26 DIAGNOSIS — K760 Fatty (change of) liver, not elsewhere classified: Secondary | ICD-10-CM | POA: Diagnosis present

## 2018-09-26 DIAGNOSIS — Z8249 Family history of ischemic heart disease and other diseases of the circulatory system: Secondary | ICD-10-CM

## 2018-09-26 DIAGNOSIS — I739 Peripheral vascular disease, unspecified: Secondary | ICD-10-CM | POA: Diagnosis present

## 2018-09-26 DIAGNOSIS — R2990 Unspecified symptoms and signs involving the nervous system: Secondary | ICD-10-CM | POA: Diagnosis present

## 2018-09-26 LAB — CBC
HCT: 47 % (ref 39.0–52.0)
Hemoglobin: 15.1 g/dL (ref 13.0–17.0)
MCH: 32.9 pg (ref 26.0–34.0)
MCHC: 32.1 g/dL (ref 30.0–36.0)
MCV: 102.4 fL — ABNORMAL HIGH (ref 80.0–100.0)
Platelets: 395 10*3/uL (ref 150–400)
RBC: 4.59 MIL/uL (ref 4.22–5.81)
RDW: 13.6 % (ref 11.5–15.5)
WBC: 5.5 10*3/uL (ref 4.0–10.5)
nRBC: 0 % (ref 0.0–0.2)

## 2018-09-26 LAB — GLUCOSE, CAPILLARY
Glucose-Capillary: 128 mg/dL — ABNORMAL HIGH (ref 70–99)
Glucose-Capillary: 174 mg/dL — ABNORMAL HIGH (ref 70–99)
Glucose-Capillary: 99 mg/dL (ref 70–99)

## 2018-09-26 LAB — DIFFERENTIAL
Abs Immature Granulocytes: 0.06 10*3/uL (ref 0.00–0.07)
Basophils Absolute: 0.1 10*3/uL (ref 0.0–0.1)
Basophils Relative: 1 %
Eosinophils Absolute: 0.1 10*3/uL (ref 0.0–0.5)
Eosinophils Relative: 1 %
Immature Granulocytes: 1 %
Lymphocytes Relative: 15 %
Lymphs Abs: 0.8 10*3/uL (ref 0.7–4.0)
Monocytes Absolute: 0.7 10*3/uL (ref 0.1–1.0)
Monocytes Relative: 13 %
Neutro Abs: 3.8 10*3/uL (ref 1.7–7.7)
Neutrophils Relative %: 69 %

## 2018-09-26 LAB — ECHOCARDIOGRAM COMPLETE BUBBLE STUDY
Height: 66 in
Weight: 2779.56 oz

## 2018-09-26 LAB — COMPREHENSIVE METABOLIC PANEL
ALT: 59 U/L — ABNORMAL HIGH (ref 0–44)
AST: 81 U/L — ABNORMAL HIGH (ref 15–41)
Albumin: 2.5 g/dL — ABNORMAL LOW (ref 3.5–5.0)
Alkaline Phosphatase: 215 U/L — ABNORMAL HIGH (ref 38–126)
Anion gap: 10 (ref 5–15)
BUN: 32 mg/dL — ABNORMAL HIGH (ref 8–23)
CO2: 29 mmol/L (ref 22–32)
Calcium: 10.1 mg/dL (ref 8.9–10.3)
Chloride: 99 mmol/L (ref 98–111)
Creatinine, Ser: 1.21 mg/dL (ref 0.61–1.24)
GFR calc Af Amer: >60 mL/min (ref >60)
GFR calc non Af Amer: 59 mL/min — ABNORMAL LOW (ref >60)
Glucose, Bld: 110 mg/dL — ABNORMAL HIGH (ref 70–99)
Potassium: 4.3 mmol/L (ref 3.5–5.1)
Sodium: 138 mmol/L (ref 135–145)
Total Bilirubin: 4.2 mg/dL — ABNORMAL HIGH (ref 0.3–1.2)
Total Protein: 7.5 g/dL (ref 6.5–8.1)

## 2018-09-26 LAB — PROTIME-INR
INR: 1 (ref 0.8–1.2)
Prothrombin Time: 13.5 seconds (ref 11.4–15.2)

## 2018-09-26 LAB — APTT: aPTT: 33 seconds (ref 24–36)

## 2018-09-26 LAB — CBG MONITORING, ED
Glucose-Capillary: 51 mg/dL — ABNORMAL LOW (ref 70–99)
Glucose-Capillary: 89 mg/dL (ref 70–99)
Glucose-Capillary: 93 mg/dL (ref 70–99)

## 2018-09-26 LAB — I-STAT CREATININE, ED: Creatinine, Ser: 1.1 mg/dL (ref 0.61–1.24)

## 2018-09-26 MED ORDER — TORSEMIDE 20 MG PO TABS
20.0000 mg | ORAL_TABLET | Freq: Every day | ORAL | Status: DC
  Administered 2018-09-26 – 2018-09-28 (×3): 20 mg via ORAL
  Filled 2018-09-26 (×3): qty 1

## 2018-09-26 MED ORDER — ACETAMINOPHEN 325 MG PO TABS
650.0000 mg | ORAL_TABLET | ORAL | Status: DC | PRN
  Filled 2018-09-26: qty 2

## 2018-09-26 MED ORDER — ACETAMINOPHEN 650 MG RE SUPP: 650.0000 mg | RECTAL | Status: DC | PRN

## 2018-09-26 MED ORDER — MUPIROCIN CALCIUM 2 % EX CREA
TOPICAL_CREAM | Freq: Every day | CUTANEOUS | Status: DC
  Administered 2018-09-26 – 2018-09-28 (×3): via TOPICAL
  Filled 2018-09-26: qty 15

## 2018-09-26 MED ORDER — ACETAMINOPHEN 160 MG/5ML PO SOLN
650.0000 mg | ORAL | Status: DC | PRN
  Administered 2018-09-27: 650 mg
  Filled 2018-09-26: qty 20.3

## 2018-09-26 MED ORDER — BISACODYL 10 MG RE SUPP
10.0000 mg | Freq: Once | RECTAL | Status: DC
  Filled 2018-09-26: qty 1

## 2018-09-26 MED ORDER — SPIRONOLACTONE 12.5 MG HALF TABLET
12.5000 mg | ORAL_TABLET | Freq: Every day | ORAL | Status: DC
  Administered 2018-09-26 – 2018-09-28 (×3): 12.5 mg via ORAL
  Filled 2018-09-26 (×3): qty 1

## 2018-09-26 MED ORDER — STROKE: EARLY STAGES OF RECOVERY BOOK
Freq: Once | Status: AC
  Administered 2018-09-26: 18:00:00

## 2018-09-26 MED ORDER — ALTEPLASE (STROKE) FULL DOSE INFUSION
0.9000 mg/kg | Freq: Once | INTRAVENOUS | Status: AC
  Administered 2018-09-26: 72.1 mg via INTRAVENOUS
  Filled 2018-09-26: qty 100

## 2018-09-26 MED ORDER — POLYVINYL ALCOHOL 1.4 % OP SOLN
1.0000 [drp] | Freq: Every day | OPHTHALMIC | Status: DC | PRN
  Filled 2018-09-26: qty 15

## 2018-09-26 MED ORDER — LATANOPROST 0.005 % OP SOLN
1.0000 [drp] | Freq: Every day | OPHTHALMIC | Status: DC
  Administered 2018-09-26 – 2018-09-27 (×2): 1 [drp] via OPHTHALMIC
  Filled 2018-09-26: qty 2.5

## 2018-09-26 MED ORDER — SODIUM CHLORIDE 0.9 % IV SOLN
INTRAVENOUS | Status: DC
  Administered 2018-09-26 (×2): via INTRAVENOUS

## 2018-09-26 MED ORDER — SODIUM CHLORIDE 0.9 % IV SOLN
50.0000 mL | Freq: Once | INTRAVENOUS | Status: AC
  Administered 2018-09-26: 50 mL via INTRAVENOUS

## 2018-09-26 MED ORDER — INSULIN ASPART 100 UNIT/ML ~~LOC~~ SOLN
0.0000 [IU] | SUBCUTANEOUS | Status: DC
  Administered 2018-09-27: 1 [IU] via SUBCUTANEOUS
  Administered 2018-09-27: 2 [IU] via SUBCUTANEOUS

## 2018-09-26 MED ORDER — MIDODRINE HCL 5 MG PO TABS
5.0000 mg | ORAL_TABLET | Freq: Two times a day (BID) | ORAL | Status: DC
  Administered 2018-09-26 – 2018-09-28 (×4): 5 mg via ORAL
  Filled 2018-09-26 (×4): qty 1

## 2018-09-26 MED ORDER — FUROSEMIDE 10 MG/ML IJ SOLN
20.0000 mg | Freq: Once | INTRAMUSCULAR | Status: AC
  Administered 2018-09-26: 20 mg via INTRAVENOUS
  Filled 2018-09-26: qty 2

## 2018-09-26 MED ORDER — SODIUM CHLORIDE 0.9% FLUSH
3.0000 mL | Freq: Once | INTRAVENOUS | Status: AC
  Administered 2018-09-26: 3 mL via INTRAVENOUS

## 2018-09-26 MED ORDER — SENNOSIDES-DOCUSATE SODIUM 8.6-50 MG PO TABS
1.0000 | ORAL_TABLET | Freq: Every evening | ORAL | Status: DC | PRN
  Administered 2018-09-26: 1 via ORAL
  Filled 2018-09-26: qty 1

## 2018-09-26 NOTE — Plan of Care (Signed)
Have provided stroke handbook to patient. Will continue to monitor for any questions. Jaclyn Shaggy RN
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
## 2018-09-26 NOTE — Progress Notes (Signed)
Pharmacist Code Stroke Response  Notified to mix tPA at 1009 by Dr. Otelia Limes Delivered tPA to RN at 1014  tPA dose = 7.2mg  bolus over 1 minute followed by 64.9mg  for a total dose of 72.1mg  over 1 hour  Issues/delays encountered (if applicable): N/A  Stanley [name], Stanley [name] 09/26/18 10:17 AM

## 2018-09-26 NOTE — Consult Note (Signed)
WOC Nurse wound consult note Patient receiving care in [facility] 4N25.  Orders for wound care for the legs are already present in the chart.  Please follow what is currently ordered for wound care. Reason for Consult: Bilateral shin wounds Monitor the wound area(s) for worsening of condition such as: Signs/symptoms of infection,  Increase in size,  Development of or worsening of odor, Development of pain, or increased pain at the affected locations.  Notify the medical team if any of these develop.  Helmut Muster, RN, MSN, CWOCN, CNS-BC, pager [phone]

## 2018-09-26 NOTE — Progress Notes (Signed)
  Echocardiogram 2D Echocardiogram has been performed.  Stanley [name] 09/26/2018, 2:44 PM

## 2018-09-26 NOTE — Code Documentation (Signed)
73 yo male coming from [facility] via [facility]. Pt was noted to be at baseline when he woke up this mornign at 0600. Staff reports last noting him at baseline at 0630. The patient complains of sudden onset of slurred speech. Staff made aware and EMS called. Code Stroke activated in the field. Stroke Team met patient upon arrival to the ED. Initial NIHSS 6 due to right arm weakness secondary to rotator cuff injury, bilateral leg weakness, right facial droop, and dysarthria. EDP cleared airway upon arrival to the bridge. CT negative for hemorrhage. Pt consented for tPA and verbalized understanding. tPA started at 1017 with bolus of 7.2 mg given over one minute. Remaining 64.9 ml given over one hour. Pt brought back to room and placed on cardiac monitor. Admitted to [place]. Handoff given to Burman Nieves, Therapist, sports.
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
## 2018-09-26 NOTE — Progress Notes (Signed)
Carotid artery duplex completed. Refer to "CV Proc" under chart review to view preliminary results.  09/26/2018 2:27 PM Gertie Fey, MHA, RVT, RDCS, RDMS

## 2018-09-26 NOTE — ED Notes (Signed)
Bed changed and brief changed. Pt cleaned

## 2018-09-26 NOTE — Progress Notes (Signed)
PT Cancellation Note  Patient Details Name: DREVON NEVINS MRN: [id number] DOB: 03-13-1946   Cancelled Treatment:    Reason Eval/Treat Not Completed: Pt currently on bedrest. Will await increased activity orders prior to initiating PT eval.    Marylynn Pearson 09/26/2018, 1:49 PM   Conni Slipper, PT, DPT Acute Rehabilitation Services Pager: [phone] Office: [phone]
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
## 2018-09-26 NOTE — ED Triage Notes (Signed)
Pt arrives via EMS from [facility] with reports of LSN at 630. Staff noticed pt had trouble talking, right facial droop. Pt reports HA of 6/10

## 2018-09-27 ENCOUNTER — Telehealth: Payer: Self-pay | Admitting: Cardiology

## 2018-09-27 ENCOUNTER — Inpatient Hospital Stay (HOSPITAL_COMMUNITY): Payer: Medicare Other

## 2018-09-27 DIAGNOSIS — I6300 Cerebral infarction due to thrombosis of unspecified precerebral artery: Secondary | ICD-10-CM

## 2018-09-27 LAB — HEMOGLOBIN A1C
Hgb A1c MFr Bld: 5.3 % (ref 4.8–5.6)
Mean Plasma Glucose: 105.41 mg/dL

## 2018-09-27 LAB — GLUCOSE, CAPILLARY
Glucose-Capillary: 68 mg/dL — ABNORMAL LOW (ref 70–99)
Glucose-Capillary: 85 mg/dL (ref 70–99)
Glucose-Capillary: 94 mg/dL (ref 70–99)
Glucose-Capillary: 89 mg/dL (ref 70–99)
Glucose-Capillary: 130 mg/dL — ABNORMAL HIGH (ref 70–99)
Glucose-Capillary: 126 mg/dL — ABNORMAL HIGH (ref 70–99)

## 2018-09-27 LAB — LIPID PANEL
Cholesterol: 156 mg/dL (ref 0–200)
HDL: 28 mg/dL — ABNORMAL LOW (ref >40)
LDL Cholesterol: 116 mg/dL — ABNORMAL HIGH (ref 0–99)
Total CHOL/HDL Ratio: 5.6 RATIO
Triglycerides: 62 mg/dL (ref <150)
VLDL: 12 mg/dL (ref 0–40)

## 2018-09-27 MED ORDER — INSULIN ASPART 100 UNIT/ML ~~LOC~~ SOLN: 0.0000 [IU] | Freq: Three times a day (TID) | SUBCUTANEOUS | Status: DC

## 2018-09-27 MED ORDER — ASPIRIN 81 MG PO CHEW
81.0000 mg | CHEWABLE_TABLET | Freq: Every day | ORAL | Status: DC
  Administered 2018-09-27 – 2018-09-28 (×2): 81 mg via ORAL
  Filled 2018-09-27 (×2): qty 1

## 2018-09-27 MED ORDER — CLOPIDOGREL BISULFATE 75 MG PO TABS
75.0000 mg | ORAL_TABLET | Freq: Every day | ORAL | Status: DC
  Administered 2018-09-27 – 2018-09-28 (×2): 75 mg via ORAL
  Filled 2018-09-27 (×2): qty 1

## 2018-09-27 NOTE — Progress Notes (Signed)
PT Cancellation Note  Patient Details Name: RONIN FARB MRN: [id number] DOB: 12-Feb-1946   Cancelled Treatment:    Reason Eval/Treat Not Completed: Active bedrest order   Enedina Finner Adar Rase 09/27/2018, 6:58 AM  Delaney Meigs, PT Acute Rehabilitation Services Pager: [phone] Office: [phone]

## 2018-09-27 NOTE — Evaluation (Signed)
Physical Therapy Evaluation Patient Details Name: Stanley [name] MRN: [id number] DOB: 04-09-1946 Today's Date: 09/27/2018   History of Present Illness  Stanley [name] is a 73 y.o. male with history of prediabetes, plantar fasciitis, PAD, hypertension, hyperlipidemia, chronic diastolic heart failure, carpal tunnel, CAD, admitted wi s/s of stroke including right facial droop, dysarthria, right arm drift and difuse weakness, tPA given.  CT shows no focal cortical abnormality.  Clinical Impression  Pt admitted with/for signs of stroke.  Per imaging there are no acute abnormalities post tPA.  Pt does still need a solid min assist from mobility.  Pt will benefit from continued PT at SNF level/      Follow Up Recommendations SNF;Supervision/Assistance - 24 hour    Equipment Recommendations  None recommended by PT    Recommendations for Other Services       Precautions / Restrictions Precautions Precautions: Fall Precaution Comments: multiple  blisters on legs, SOB Restrictions Weight Bearing Restrictions: (P) No      Mobility  Bed Mobility Overal bed mobility: Needs Assistance Bed Mobility: Supine to Sit     Supine to sit: Min assist     General bed mobility comments: signifcant time/effort to perform but pt only requiring minA for trunk elevation and light minA to scoot hips towards EOB, pt taking intermittent rest breaks with scooting hips as he was motivated to perform on his own  Transfers Overall transfer level: Needs assistance Equipment used: Rolling walker (2 wheeled) Transfers: Sit to/from Stand Sit to Stand: Mod assist;+2 safety/equipment         General transfer comment: boosting assist to rise and steady at RW, VCs for safe hand placement; pt initially nervous with standing upright requiring increased time/assist to maintain standing balance prior to taking steps  Ambulation/Gait Ambulation/Gait assistance: Min assist;+2 safety/equipment(safety person for  building confidence) Gait Distance (Feet): 30 Feet Assistive device: Rolling walker (2 wheeled) Gait Pattern/deviations: Step-through pattern Gait velocity: slow Gait velocity interpretation: <1.31 ft/sec, indicative of household ambulator General Gait Details: short, guarded steps with moderate use of the RW.  Pt is fearful of falling, but is steadier than he realizes  Financial trader Rankin (Stroke Patients Only) Modified Rankin (Stroke Patients Only) Pre-Morbid Rankin Score: Moderate disability Modified Rankin: Moderately severe disability     Balance Overall balance assessment: Needs assistance Sitting-balance support: Feet supported;Single extremity supported Sitting balance-Leahy Scale: Good Sitting balance - Comments: minguard assist for sitting balance   Standing balance support: Bilateral upper extremity supported Standing balance-Leahy Scale: Poor Standing balance comment: reliant on UE support                             Pertinent Vitals/Pain Pain Assessment: Faces Faces Pain Scale: Hurts even more Pain Location: back Pain Descriptors / Indicators: Grimacing;Sore Pain Intervention(s): Monitored during session    Home Living Family/patient expects to be discharged to:: [place]: Non-relatives/Friends Available Help at Discharge: Friend(s);Available 24 hours/day Type of Home: House Home Access: Stairs to enter Entrance Stairs-Rails: None Entrance Stairs-Number of Steps: 2 + 1  Home Layout: One level Home Equipment: Walker - 2 wheels Additional Comments: (P) home setup obtained from previous hospital stay    Prior Function Level of Independence: Needs assistance   Gait / Transfers Assistance Needed:  uses RW and is being followed or assisted for confidence at  this point           Hand Dominance   Dominant Hand: Right    Extremity/Trunk Assessment   Upper  Extremity Assessment Upper Extremity Assessment: Defer to OT evaluation    Lower Extremity Assessment Lower Extremity Assessment: RLE deficits/detail;LLE deficits/detail RLE Deficits / Details: grossly >=3/5  sore knee, edematous foot. RLE Coordination: decreased fine motor LLE Deficits / Details: grossly 3/5, painful knee LLE Coordination: decreased fine motor    Cervical / Trunk Assessment Cervical / Trunk Assessment: Kyphotic  Communication   Communication: No difficulties  Cognition Arousal/Alertness: Awake/alert Behavior During Therapy: WFL for tasks assessed/performed Overall Cognitive Status: Within Functional Limits for tasks assessed                                        General Comments General comments (skin integrity, edema, etc.): RR up to mid 40s with activity, pt on RA throughout and SpO2 briefly down to 88% though overall maintaining at 93% and above, 93-98% on RA end of session; BP 106/79 end of session    Exercises Other Exercises Other Exercises: warm up ROM in addition to MMT   Assessment/Plan    PT Assessment All further PT needs can be met in the next venue of care  PT Problem List         PT Treatment Interventions      PT Goals (Current goals can be found in the Care Plan section)  Acute Rehab PT Goals Patient Stated Goal: return to rehab and regain independence PT Goal Formulation: All assessment and education complete, DC therapy    Frequency     Barriers to discharge        Co-evaluation PT/OT/SLP Co-Evaluation/Treatment: Yes Reason for Co-Treatment: Complexity of the patient's impairments (multi-system involvement) PT goals addressed during session: Mobility/safety with mobility OT goals addressed during session: ADL's and self-care       AM-PAC PT "6 Clicks" Mobility  Outcome Measure Help needed turning from your back to your side while in a flat bed without using bedrails?: A Little Help needed moving from  lying on your back to sitting on the side of a flat bed without using bedrails?: A Little Help needed moving to and from a bed to a chair (including a wheelchair)?: A Little Help needed standing up from a chair using your arms (e.g., wheelchair or bedside chair)?: A Little Help needed to walk in hospital room?: A Little Help needed climbing 3-5 steps with a railing? : A Lot 6 Click Score: 17    End of Session   Activity Tolerance: Patient limited by fatigue Patient left: in chair;with call bell/phone within reach;with chair alarm set Nurse Communication: Mobility status;Other (comment) PT Visit Diagnosis: Unsteadiness on feet (R26.81);Other abnormalities of gait and mobility (R26.89)    Time: 1610-9604 PT Time Calculation (min) (ACUTE ONLY): 27 min   Charges:   PT Evaluation $PT Eval Moderate Complexity: 1 Mod          09/27/2018  Donnella Sham, PT Acute Rehabilitation Services [phone]  (pager) [phone]  (office)  Tessie Fass Brendin Situ 09/27/2018, 3:54 PM
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
## 2018-09-27 NOTE — Progress Notes (Signed)
OT Evaluation:  This 73 y/o male presents with the above. Pt was most recently in SNF ST rehab ([facility]), reports he was working with therapy on functional mobility using RW, completing ADLs with some assist. Prior to this pt reports he was quite active and independent. Pt presents supine in bed pleasant and willing to participate in therapy session. Pt currently requiring overall minA for functional mobility using RW within room (+2 safety); requires mod-maxA for LB ADL, minA for UB ADL. Pt somewhat anxious about moving but is motivated to return to PLOF. RR up to mid 40s intermittently with activity, pt on RA with SpO2 briefly down to 88%, overall maintaining above 93%. He will benefit from continued acute OT services and recommend pt return to SNF at time of discharge for continued therapy services to maximize his safety and independence with ADL and mobility. Will follow.     09/27/18 1541  OT Visit Information  Last OT Received On 09/27/18  Assistance Needed +2  PT/OT/SLP Co-Evaluation/Treatment Yes  Reason for Co-Treatment Complexity of the patient's impairments (multi-system involvement);For patient/therapist safety;To address functional/ADL transfers  OT goals addressed during session ADL's and self-care;Proper use of Adaptive equipment and DME  History of Present Illness Stanley [name] is a 73 y.o. male with history of prediabetes, plantar fasciitis, PAD, hypertension, hyperlipidemia, chronic diastolic heart failure, carpal tunnel, CAD, admitted wi s/s of stroke including right facial droop, dysarthria, right arm drift and difuse weakness, tPA given.  CT shows no focal cortical abnormality.  Precautions  Precautions Fall  Precaution Comments multiple  blisters on legs, SOB  Restrictions  Weight Bearing Restrictions No  Home Living  Family/patient expects to be discharged to: Skilled nursing facility  Living Arrangements Non-relatives/Friends  Available Help at Discharge  Friend(s);Available 24 hours/day  Type of Home House  Home Access Stairs to enter  Entrance Stairs-Number of Steps 2 + 1   Entrance Stairs-Rails None  Home Layout One level  Health and safety inspector - 2 wheels  Additional Comments home setup obtained from previous hospital stay  Prior Function  Level of Independence Needs assistance  Gait / Transfers Assistance Needed  uses RW and is being followed or assisted for confidence at this point  ADL's / Homemaking Assistance Needed reports he completes ADL "50/50" with some assist from staff including bathroom transfers and LB dressing  Communication  Communication No difficulties  Pain Assessment  Pain Assessment Faces  Faces Pain Scale 6  Pain Location back  Pain Descriptors / Indicators Grimacing;Sore  Pain Intervention(s) Limited activity within patient's tolerance;Monitored during session;Repositioned  Cognition  Arousal/Alertness Awake/alert  Behavior During Therapy WFL for tasks assessed/performed  Overall Cognitive Status Within Functional Limits for tasks assessed  Upper Extremity Assessment  Upper Extremity Assessment RUE deficits/detail  RUE Deficits / Details painful shoulder ROM; per chart review pt with rotator cuff tear; able to move elbow and hand through full AROM  RUE Unable to fully assess due to pain  Lower Extremity Assessment  Lower Extremity Assessment Defer to PT evaluation  Cervical / Trunk Assessment  Cervical / Trunk Assessment Kyphotic  ADL  Overall ADL's  Needs assistance/impaired  Eating/Feeding Set up;Sitting  Eating/Feeding Details (indicate cue type and reason) setup to open containers and items placed so he can access with LUE  Grooming Set up;Sitting  Upper Body Bathing Minimal assistance;Sitting  Lower Body Bathing Moderate assistance;Sit to/from stand  Upper Body Dressing  Minimal assistance;Sitting  Lower  Body Dressing Maximal  assistance;Sit to/from stand  Lower Body Dressing Details (indicate cue type and reason) requires assist to don socks today  Toilet Transfer Minimal assistance;+2 for safety/equipment;Ambulation;RW  Toilet Transfer Details (indicate cue type and reason) simulated via transfer to recliner, room level mobility  Toileting- Clothing Manipulation and Hygiene Moderate assistance;+2 for safety/equipment;Sit to/from stand  Functional mobility during ADLs Minimal assistance;+2 for safety/equipment;Rolling walker  Vision- History  Baseline Vision/History Wears glasses  Wears Glasses At all times  Patient Visual Report No change from baseline  Vision- Assessment  Vision Assessment? No apparent visual deficits  Additional Comments did not formally assess, no apparent deficits  Bed Mobility  Overal bed mobility Needs Assistance  Bed Mobility Supine to Sit  Supine to sit Min assist  General bed mobility comments signifcant time/effort to perform but pt only requiring minA for trunk elevation and light minA to scoot hips towards EOB, pt taking intermittent rest breaks with scooting hips as he was motivated to perform on his own  Transfers  Overall transfer level Needs assistance  Equipment used Rolling walker (2 wheeled)  Transfers Sit to/from Stand  Sit to Stand Mod assist;+2 safety/equipment  General transfer comment boosting assist to rise and steady at RW, VCs for safe hand placement; pt initially nervous with standing upright requiring increased time/assist to maintain standing balance prior to taking steps  Balance  Overall balance assessment Needs assistance  Sitting-balance support Feet supported;Single extremity supported  Sitting balance-Leahy Scale Good  Sitting balance - Comments minguard assist for sitting balance  Standing balance support Bilateral upper extremity supported  Standing balance-Leahy Scale Poor  Standing balance comment reliant on UE support  General Comments  General  comments (skin integrity, edema, etc.) RR up to mid 40s with activity, pt on RA throughout and SpO2 briefly down to 88% though overall maintaining at 93% and above, 93-98% on RA end of session; BP 106/79 end of session  OT - End of Session  Equipment Utilized During Treatment Rolling walker;Oxygen  Activity Tolerance Patient tolerated treatment well  Patient left in chair;with call bell/phone within reach;with chair alarm set  Nurse Communication Mobility status  OT Assessment  OT Recommendation/Assessment Patient needs continued OT Services  OT Visit Diagnosis Muscle weakness (generalized) (M62.81);Unsteadiness on feet (R26.81)  OT Problem List Decreased strength;Decreased range of motion;Decreased activity tolerance;Impaired balance (sitting and/or standing);Decreased safety awareness;Decreased knowledge of use of DME or AE;Pain  OT Plan  OT Frequency (ACUTE ONLY) Min 2X/week  OT Treatment/Interventions (ACUTE ONLY) Self-care/ADL training;Therapeutic exercise;Neuromuscular education;Energy conservation;DME and/or AE instruction;Therapeutic activities;Patient/family education;Balance training  AM-PAC OT "6 Clicks" Daily Activity Outcome Measure (Version 2)  Help from another person eating meals? 3  Help from another person taking care of personal grooming? 3  Help from another person toileting, which includes using toliet, bedpan, or urinal? 2  Help from another person bathing (including washing, rinsing, drying)? 2  Help from another person to put on and taking off regular upper body clothing? 3  Help from another person to put on and taking off regular lower body clothing? 2  6 Click Score 15  OT Recommendation  Follow Up Recommendations SNF;Supervision/Assistance - 24 hour  OT Equipment  (defer to next venue)  Individuals Consulted  Consulted and Agree with Results and Recommendations Patient  Acute Rehab OT Goals  Patient Stated Goal return to rehab and regain independence  OT Goal  Formulation With patient  Time For Goal Achievement 10/11/18  Potential to Achieve Goals Good  OT  Time Calculation  OT Start Time (ACUTE ONLY) 1503  OT Stop Time (ACUTE ONLY) 1536  OT Time Calculation (min) 33 min  OT General Charges  $OT Visit 1 Visit  OT Evaluation  $OT Eval Moderate Complexity 1 Mod  Written Expression  Dominant Hand Right   Stanley [name], OT Supplemental [facility] Services Pager [phone] Office [phone]

## 2018-09-27 NOTE — NC FL2 (Addendum)
[place] MEDICAID FL2 LEVEL OF CARE SCREENING TOOL     IDENTIFICATION  Patient Name: Stanley [name] Birthdate: 06/28/1945 Sex: male Admission Date (Current Location): 09/26/2018  [facility] and [place] Number:  Producer, television/film/video and Address:  [facility], [address] [place], [place] [address]      Provider Number: [id number]  Attending Physician Name and Address:  Micki Riley, MD  Relative Name and Phone Number:       Current Level of Care: Hospital Recommended Level of Care: Skilled Nursing Facility Prior Approval Number:    Date Approved/Denied:   PASRR Number:    Discharge Plan: SNF    Current Diagnoses: Patient Active Problem List   Diagnosis Date Noted  . Stroke (HCC) 09/26/2018  . Lactic acidosis 09/08/2018  . Anasarca 09/07/2018  . Elevated LFTs 09/07/2018  . CHF exacerbation (HCC) 09/07/2018  . Acute on chronic combined systolic and diastolic CHF (congestive heart failure) (HCC) 09/07/2018  . Prolonged QT interval 09/07/2018  . Cellulitis 09/07/2018  . Malnutrition of moderate degree 07/04/2018  . Ventral hernia with bowel obstruction 07/03/2018  . Incarcerated epigastric hernia 07/02/2018  . SBO (small bowel obstruction) (HCC) 07/02/2018  . CKD (chronic kidney disease) stage 3, GFR 30-59 ml/min (HCC) 07/02/2018  . Essential hypertension   . Coronary artery disease due to lipid rich plaque   . Peripheral arterial disease (HCC) 10/26/2017  . Morbid obesity due to excess calories (HCC) 01/25/2017  . Upper airway cough syndrome 01/24/2017  . CAD (coronary artery disease), native coronary artery 03/21/2016  . Chronic combined systolic and diastolic CHF (congestive heart failure) (HCC)   . Dyspnea on exertion   . Thrombocytopenia (HCC)   . Elevated troponin 12/05/2015  . Hyperlipidemia   . Essential hypertension, benign 10/16/2013  . Encounter for long-term (current) use of other medications 10/16/2013  . Nevus,  non-neoplastic 07/25/2012  . Varicose veins of lower extremities with other complications 06/25/2012  . Leg pain, bilateral 06/25/2012  . Swelling of limb 03/12/2012  . Cholelithiasis 02/10/2011    Orientation RESPIRATION BLADDER Height & Weight     Self, Time, Situation, Place  Normal Incontinent Weight: 173 lb 11.6 oz (78.8 kg) Height:  5\' 6"  (167.6 cm)  BEHAVIORAL SYMPTOMS/MOOD NEUROLOGICAL BOWEL NUTRITION STATUS      Incontinent Diet(see DC summary)  AMBULATORY STATUS COMMUNICATION OF NEEDS Skin   Extensive Assist Verbally Other (Comment)(right and left lower leg blisters, impregnated gauze dressing changed daily)                       Personal Care Assistance Level of Assistance  Bathing, Feeding, Dressing Bathing Assistance: Limited assistance Feeding assistance: Limited assistance Dressing Assistance: Limited assistance     Functional Limitations Info  Sight, Hearing, Speech Sight Info: Impaired(wears glasses) Hearing Info: Adequate Speech Info: Impaired(dysarthria)    SPECIAL CARE FACTORS FREQUENCY  PT (By licensed PT), OT (By licensed OT)     PT Frequency: 5x/wk OT Frequency: 5x/wk            Contractures Contractures Info: Not present    Additional Factors Info  Code Status, Allergies, Insulin Sliding Scale Code Status Info: Full Allergies Info: Meloxicam, Methocarbamol, Prednisone, Statins, Zetia Ezetimibe, Losartan, Prabotulinumtoxina   Insulin Sliding Scale Info: 0-9 units every 4 hours       Current Medications (09/27/2018):  This is the current hospital active medication list Current Facility-Administered Medications  Medication Dose Route Frequency Provider  Last Rate Last Dose  . 0.9 %  sodium chloride infusion   Intravenous Continuous Ulice Dash, PA-C   Stopped at 09/27/18 5053  . acetaminophen (TYLENOL) tablet 650 mg  650 mg Oral Q4H PRN Ulice Dash, PA-C       Or  . acetaminophen (TYLENOL) solution 650 mg  650 mg Per Tube Q4H PRN  Ulice Dash, PA-C   650 mg at 09/27/18 0327   Or  . acetaminophen (TYLENOL) suppository 650 mg  650 mg Rectal Q4H PRN Ulice Dash, PA-C      . aspirin chewable tablet 81 mg  81 mg Oral Daily Annie Main L, NP      . bisacodyl (DULCOLAX) suppository 10 mg  10 mg Rectal Once Rejeana Brock, MD      . clopidogrel (PLAVIX) tablet 75 mg  75 mg Oral Daily Biby, Sharon L, NP      . insulin aspart (novoLOG) injection 0-9 Units  0-9 Units Subcutaneous Q4H Caryl Pina, MD   2 Units at 09/27/18 0003  . latanoprost (XALATAN) 0.005 % ophthalmic solution 1 drop  1 drop Both Eyes QHS Ulice Dash, PA-C   1 drop at 09/26/18 2200  . midodrine (PROAMATINE) tablet 5 mg  5 mg Oral BID WC Ulice Dash, PA-C   5 mg at 09/27/18 0800  . mupirocin cream (BACTROBAN) 2 %   Topical Daily Ulice Dash, PA-C      . polyvinyl alcohol (LIQUIFILM TEARS) 1.4 % ophthalmic solution 1 drop  1 drop Both Eyes Daily PRN Ulice Dash, PA-C      . senna-docusate (Senokot-S) tablet 1 tablet  1 tablet Oral QHS PRN Ulice Dash, PA-C   1 tablet at 09/26/18 2357  . spironolactone (ALDACTONE) tablet 12.5 mg  12.5 mg Oral Daily Ulice Dash, PA-C   12.5 mg at 09/26/18 1214  . torsemide (DEMADEX) tablet 20 mg  20 mg Oral Daily Ulice Dash, PA-C   20 mg at 09/26/18 1215     Discharge Medications: Please see discharge summary for a list of discharge medications.  Relevant Imaging Results:  Relevant Lab Results:   Additional Information SS#: [id number]  Baldemar [place], [place]   I have personally obtained history,examined this patient, reviewed notes, independently viewed imaging studies, participated in medical decision making and plan of care.ROS completed by me personally and pertinent positives fully documented  I have made any additions or clarifications directly to the above note. Agree with note above.  Delia Heady, MD Medical Director [facility] Pager: [phone] 09/27/2018  1:22 PM

## 2018-09-27 NOTE — Telephone Encounter (Signed)
Per family Mr. Gowda in in ICU due to stroke.  Cancel appointment for now.

## 2018-09-27 NOTE — Progress Notes (Signed)
OT Cancellation Note  Patient Details Name: Stanley [name] MRN: [id number] DOB: [birth year]   Cancelled Treatment:    Reason Eval/Treat Not Completed: Active bedrest order; will follow.   Marcy Siren, OT Supplemental Rehabilitation Services Pager [phone] Office [phone]   Orlando Penner 09/27/2018, 8:05 AM

## 2018-09-27 NOTE — TOC Initial Note (Signed)
Transition of Care [facility]) - Initial/Assessment Note    Patient Details  Name: Stanley [name] MRN: [id number] Date of Birth: March 15, 1946  [facility] CM/SW Contact:    Baldemar Lenis, LCSW Phone Number: 09/27/2018, 12:57 PM  Clinical Narrative:  Patient admitted from [facility] where he was receiving rehabilitation. Plan is to return when stable. [facility] will need therapy evaluations prior to initiating new insurance authorization, but will be able to admit patient back when stable.                   Expected Discharge Plan: Skilled Nursing Facility Barriers to Discharge: English as a second language teacher, Continued Medical Work up   Patient Goals and CMS Choice   CMS Medicare.gov Compare Post Acute Care list provided to:: Patient Choice offered to / list presented to : Patient  Expected Discharge Plan and Services Expected Discharge Plan: Skilled Nursing Facility       Living arrangements for the past 2 months: Single Family Home, Skilled Nursing Facility                          Prior Living Arrangements/Services Living arrangements for the past 2 months: Single Family Home, Skilled Nursing Facility Lives with:: Friends Patient language and need for interpreter reviewed:: No Do you feel safe going back to the place where you live?: Yes      Need for Family Participation in Patient Care: No (Comment) Care giver support system in place?: Yes (comment)   Criminal Activity/Legal Involvement Pertinent to Current Situation/Hospitalization: No - Comment as needed  Activities of Daily Living Home Assistive Devices/Equipment: Eyeglasses, Dan Humphreys (specify type) ADL Screening (condition at time of admission) Patient's cognitive ability adequate to safely complete daily activities?: Yes Is the patient deaf or have difficulty hearing?: No Does the patient have difficulty seeing, even when wearing glasses/contacts?: No Does the patient have difficulty concentrating,  remembering, or making decisions?: No Patient able to express need for assistance with ADLs?: Yes Does the patient have difficulty dressing or bathing?: Yes Independently performs ADLs?: No Communication: Independent Dressing (OT): Needs assistance Is this a change from baseline?: Pre-admission baseline Grooming: Independent Feeding: Independent Bathing: Needs assistance Is this a change from baseline?: Pre-admission baseline Toileting: Needs assistance Is this a change from baseline?: Pre-admission baseline In/Out Bed: Needs assistance Is this a change from baseline?: Pre-admission baseline Walks in Home: Dependent Is this a change from baseline?: Pre-admission baseline Does the patient have difficulty walking or climbing stairs?: Yes Weakness of Legs: Both Weakness of Arms/Hands: Both  Permission Sought/Granted Permission sought to share information with : Oceanographer granted to share information with : Yes, Verbal Permission Granted     Permission granted to share info w AGENCY: [facility]        Emotional Assessment Appearance:: Appears stated age Attitude/Demeanor/Rapport: Engaged Affect (typically observed): Appropriate Orientation: : Oriented to Self, Oriented to Place, Oriented to  Time, Oriented to Situation Alcohol / Substance Use: Not Applicable Psych Involvement: No (comment)  Admission diagnosis:  Cerebral infarction, unspecified mechanism (HCC) [I63.9] Patient Active Problem List   Diagnosis Date Noted  . Stroke (HCC) 09/26/2018  . Lactic acidosis 09/08/2018  . Anasarca 09/07/2018  . Elevated LFTs 09/07/2018  . CHF exacerbation (HCC) 09/07/2018  . Acute on chronic combined systolic and diastolic CHF (congestive heart failure) (HCC) 09/07/2018  . Prolonged QT interval 09/07/2018  . Cellulitis 09/07/2018  . Malnutrition of moderate degree 07/04/2018  . Ventral  hernia with bowel obstruction 07/03/2018  . Incarcerated  epigastric hernia 07/02/2018  . SBO (small bowel obstruction) (HCC) 07/02/2018  . CKD (chronic kidney disease) stage 3, GFR 30-59 ml/min (HCC) 07/02/2018  . Essential hypertension   . Coronary artery disease due to lipid rich plaque   . Peripheral arterial disease (HCC) 10/26/2017  . Morbid obesity due to excess calories (HCC) 01/25/2017  . Upper airway cough syndrome 01/24/2017  . CAD (coronary artery disease), native coronary artery 03/21/2016  . Chronic combined systolic and diastolic CHF (congestive heart failure) (HCC)   . Dyspnea on exertion   . Thrombocytopenia (HCC)   . Elevated troponin 12/05/2015  . Hyperlipidemia   . Essential hypertension, benign 10/16/2013  . Encounter for long-term (current) use of other medications 10/16/2013  . Nevus, non-neoplastic 07/25/2012  . Varicose veins of lower extremities with other complications 06/25/2012  . Leg pain, bilateral 06/25/2012  . Swelling of limb 03/12/2012  . Cholelithiasis 02/10/2011   PCP:  [name], MD Pharmacy:   [facility] [phone] [place], [place] - [phone] AT [facility] [address] [place] [address] Phone: [phone] Fax: [phone]  [facility] - [place], [place] - [address] [place] [address] Phone: [phone] Fax: [phone]  [facility] - [place] - [address] [facility] [address] [place] [address] [place]  [address] Phone: [phone] Fax: [phone]  [facility] [phone] [facility] [place] - [place], [place] - [address] [place] [address] Phone: [phone] Fax: [phone]     Social Determinants of Health (SDOH) Interventions    Readmission Risk Interventions No flowsheet data found.

## 2018-09-28 ENCOUNTER — Telehealth: Payer: Self-pay

## 2018-09-28 DIAGNOSIS — M67911 Unspecified disorder of synovium and tendon, right shoulder: Secondary | ICD-10-CM

## 2018-09-28 LAB — GLUCOSE, CAPILLARY
Glucose-Capillary: 86 mg/dL (ref 70–99)
Glucose-Capillary: 103 mg/dL — ABNORMAL HIGH (ref 70–99)

## 2018-09-28 MED ORDER — CLOPIDOGREL BISULFATE 75 MG PO TABS: 75.0000 mg | ORAL_TABLET | Freq: Every day | ORAL | Status: DC

## 2018-09-28 MED ORDER — ASPIRIN 81 MG PO TABS: 81.0000 mg | ORAL_TABLET | Freq: Every day | ORAL | Status: AC

## 2018-09-28 NOTE — Discharge Summary (Addendum)
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 the right. Orbits left over right approximately. Normal sensation. Gait deferred.  Discharge Diet   Heart healthy thin liquids  DISCHARGE PLAN  Disposition:  Return to [name] for PT, OT  aspirin 81 mg daily and clopidogrel 75 mg daily for secondary stroke prevention x 3 weeks then Plavix alone  Ongoing risk factor control by Primary Care Physician at time of discharge  Follow-up [name], MD in 2 weeks.  Follow-up in [facility] in 4 weeks, office to schedule an appointment.   32 minutes were spent preparing discharge.  Annie Main, MSN, APRN, ANVP-BC, AGPCNP-BC Advanced Practice Stroke Nurse [facility] See Amion for Schedule & Pager information 09/28/2018 1:46 PM   I have personally  obtained history,examined this patient, reviewed notes, independently viewed imaging studies, participated in medical decision making and plan of care.ROS completed by me personally and pertinent positives fully documented  I have made any additions or clarifications directly to the above note. Agree with note above.    Delia Heady, MD Medical Director [facility] Pager: [phone] 09/28/2018 3:48 PM

## 2018-09-28 NOTE — TOC Transition Note (Signed)
Transition of Care [facility]) - CM/SW Discharge Note   Patient Details  Name: ABDULWAHAB FRADE MRN: [id number] Date of Birth: 01-Feb-1946  Transition of Care [facility]) CM/SW Contact:  Baldemar Lenis, LCSW Phone Number: 09/28/2018, 2:14 PM   Clinical Narrative:  Nurse to call report to [phone], Room 3238     Final next level of care: Skilled Nursing Facility Barriers to Discharge: No Barriers Identified   Patient Goals and CMS Choice   CMS Medicare.gov Compare Post Acute Care list provided to:: Patient Choice offered to / list presented to : Patient  Discharge Placement              Patient chooses bed at: [facility] Patient to be transferred to facility by: [facility] Name of family member notified: Self Patient and family notified of of transfer: 09/28/18  Discharge Plan and Services                          Social Determinants of Health (SDOH) Interventions     Readmission Risk Interventions No flowsheet data found.

## 2018-09-28 NOTE — Telephone Encounter (Signed)
lpmtcb 4/3 

## 2018-09-28 NOTE — Progress Notes (Signed)
Physical Therapy Treatment Patient Details Name: Stanley [name] MRN: [id number] DOB: 01/11/1946 Today's Date: 09/28/2018    History of Present Illness Stanley [name] is a 73 y.o. male with history of prediabetes, plantar fasciitis, PAD, hypertension, hyperlipidemia, chronic diastolic heart failure, carpal tunnel, CAD, admitted wi s/s of stroke including right facial droop, dysarthria, right arm drift and difuse weakness, tPA given.  CT shows no focal cortical abnormality.    PT Comments    Stanley [name] worked well with PT after encouragement to participate. He demonstrated improved bed mobility; transfers; balance and endurance for ambulation. Progressing well toward rehab goals.  He will benefit from continued rehab at d/c to return to PLOF and achieve maximum rehab potential.     Follow Up Recommendations  SNF;Supervision/Assistance - 24 hour     Equipment Recommendations  None recommended by PT    Recommendations for Other Services Rehab consult     Precautions / Restrictions Precautions Precautions: Fall Precaution Comments: multiple  blisters on legs, SOB Restrictions Weight Bearing Restrictions: No    Mobility  Bed Mobility Overal bed mobility: Needs Assistance Bed Mobility: Supine to Sit     Supine to sit: Min assist     General bed mobility comments: signifcant time/effort to perform but pt requiring CGA for trunk elevation and minA to scoot hips towards EOB, pt taking intermittent rest breaks with scooting hips  Transfers Overall transfer level: Needs assistance Equipment used: Rolling walker (2 wheeled) Transfers: Sit to/from Stand Sit to Stand: +2 safety/equipment;Min assist            Ambulation/Gait Ambulation/Gait assistance: Min guard Gait Distance (Feet): 70 Feet Assistive device: Rolling walker (2 wheeled) Gait Pattern/deviations: Step-through pattern Gait velocity: slow   General Gait Details: short steps with RW, verbal cues for trunk extension.   Pt pausing for rest x 2 during ambulation    Stairs             Wheelchair Mobility    Modified Rankin (Stroke Patients Only) Modified Rankin (Stroke Patients Only) Pre-Morbid Rankin Score: Moderate disability Modified Rankin: Moderately severe disability     Balance Overall balance assessment: Needs assistance Sitting-balance support: Feet supported;Single extremity supported Sitting balance-Leahy Scale: Good Sitting balance - Comments: min guard    Standing balance support: Bilateral upper extremity supported Standing balance-Leahy Scale: Poor Standing balance comment: reliant on UE support                            Cognition Arousal/Alertness: Awake/alert Behavior During Therapy: WFL for tasks assessed/performed Overall Cognitive Status: Within Functional Limits for tasks assessed                                        Exercises General Exercises - Upper Extremity Digit Composite Flexion: AROM;Right;10 reps Composite Extension: AROM;Right;10 reps General Exercises - Lower Extremity Ankle Circles/Pumps: AROM;Both;10 reps    General Comments        Pertinent Vitals/Pain Faces Pain Scale: Hurts little more Pain Location: back Pain Descriptors / Indicators: Grimacing;Sore    Home Living                      Prior Function            PT Goals (current goals can now be found in the care plan section) Acute Rehab PT Goals  Patient Stated Goal: return to rehab and regain independence PT Goal Formulation: All assessment and education complete, DC therapy Time For Goal Achievement: 09/22/18 Potential to Achieve Goals: Good    Frequency    Min 2X/week      PT Plan Current plan remains appropriate;Other (comment)(pt to return to ECF )    Co-evaluation PT/OT/SLP Co-Evaluation/Treatment: Yes            AM-PAC PT "6 Clicks" Mobility   Outcome Measure  Help needed turning from your back to your side while  in a flat bed without using bedrails?: A Little Help needed moving from lying on your back to sitting on the side of a flat bed without using bedrails?: A Little Help needed moving to and from a bed to a chair (including a wheelchair)?: A Little Help needed standing up from a chair using your arms (e.g., wheelchair or bedside chair)?: A Little Help needed to walk in hospital room?: A Little Help needed climbing 3-5 steps with a railing? : A Lot 6 Click Score: 17    End of Session Equipment Utilized During Treatment: Gait belt Activity Tolerance: Patient limited by fatigue Patient left: in chair;with call bell/phone within reach Nurse Communication: Mobility status;Other (comment) PT Visit Diagnosis: Unsteadiness on feet (R26.81);Other abnormalities of gait and mobility (R26.89)     Time: 1443-1540 PT Time Calculation (min) (ACUTE ONLY): 22 min  Charges:  $Gait Training: 8-22 mins                     Stanley [name], PT, MPH  Acute Rehab [phone]    Stanley [name] 09/28/2018, 3:28 PM

## 2018-09-28 NOTE — Progress Notes (Signed)
SLP Cancellation Note  Patient Details Name: Stanley [name] MRN: [id number] DOB: 09-Jan-1946   Cancelled treatment:       Reason Eval/Treat Not Completed: Patient at procedure or test/unavailable. SLP attempted eval twice on this date. Pt was working with physical therapy at 1452 and he is currently with transport to be discharged from the hospital.   [name], MS, CCC-SLP Acute Rehabilitation Services Office number [phone] Pager [phone]  [name] 09/28/2018, 3:19 PM

## 2018-10-01 ENCOUNTER — Telehealth: Payer: Self-pay

## 2018-10-01 NOTE — Telephone Encounter (Signed)
   Cardiac Questionnaire:    Since your last visit or hospitalization:    1. Have you been having new or worsening chest pain? no   2. Have you been having new or worsening shortness of breath? no 3. Have you been having new or worsening leg swelling, wt gain, or increase in abdominal girth (pants fitting more tightly)? no   4. Have you had any passing out spells? no    *A YES to any of these questions would result in the appointment being kept. *If all the answers to these questions are NO, we should indicate that given the current situation regarding the worldwide coronarvirus pandemic, at the recommendation of the CDC, we are looking to limit gatherings in our waiting area, and thus will reschedule their appointment beyond four weeks from today.   _____________   Sherron Monday to the patient's nurse (Nia) at [place] [phone] and arranged Virtual Visit by Phone with Robbie Lis, PA on 4/8 @ 12:00pm

## 2018-10-01 NOTE — Telephone Encounter (Signed)
I spoke to the patient's nurse (Nia) at [place] [phone] and we arranged a Phone Virtual Visit with the patient on 4/8 at 12:00pm.

## 2018-10-01 NOTE — Telephone Encounter (Signed)
F/U Message         Patient is returning Dr. Norris Cross call

## 2018-10-01 NOTE — Telephone Encounter (Signed)
Patient is presently hospitalized. 4/6

## 2018-10-03 ENCOUNTER — Other Ambulatory Visit: Payer: Self-pay

## 2018-10-03 ENCOUNTER — Encounter: Payer: Self-pay | Admitting: Cardiology

## 2018-10-03 ENCOUNTER — Telehealth (INDEPENDENT_AMBULATORY_CARE_PROVIDER_SITE_OTHER): Payer: Medicare Other | Admitting: Cardiology

## 2018-10-03 VITALS — BP 98/72 | HR 54 | Temp 98.0°F | Ht 66.0 in | Wt 167.8 lb

## 2018-10-03 DIAGNOSIS — I5042 Chronic combined systolic (congestive) and diastolic (congestive) heart failure: Secondary | ICD-10-CM | POA: Diagnosis not present

## 2018-10-03 NOTE — Patient Instructions (Signed)
Medication Instructions:  none If you need a refill on your cardiac medications before your next appointment, please call your pharmacy.   Lab work: none If you have labs (blood work) drawn today and your tests are completely normal, you will receive your results only by: [name]. MyChart Message (if you have MyChart) OR . A paper copy in the mail If you have any lab test that is abnormal or we need to change your treatment, we will call you to review the results.  Testing/Procedures: none  Follow-Up: In August [facility], you and your health needs are our priority.  As part of our continuing mission to provide you with exceptional heart care, we have created designated Provider Care Teams.  These Care Teams include your primary Cardiologist (physician) and Advanced Practice Providers (APPs -  Physician Assistants and Nurse Practitioners) who all work together to provide you with the care you need, when you need it.  Any Other Special Instructions Will Be Listed Below (If Applicable).  WEIGHT:  Please weigh patient daily.  If patient gains more than 3 lbs in 1 DAY or 5 lbs in 1 WEEK, Please call our office   DASH Eating Plan DASH stands for "Dietary Approaches to Stop Hypertension." The DASH eating plan is a healthy eating plan that has been shown to reduce high blood pressure (hypertension). It may also reduce your risk for type 2 diabetes, heart disease, and stroke. The DASH eating plan may also help with weight loss. What are tips for following this plan?  General guidelines  Avoid eating more than 2,300 mg (milligrams) of salt (sodium) a day. If you have hypertension, you may need to reduce your sodium intake to 1,500 mg a day.  Limit alcohol intake to no more than 1 drink a day for nonpregnant women and 2 drinks a day for men. One drink equals 12 oz of beer, 5 oz of wine, or 1 oz of hard liquor.  Work with your health care provider to maintain a healthy body  weight or to lose weight. Ask what an ideal weight is for you.  Get at least 30 minutes of exercise that causes your heart to beat faster (aerobic exercise) most days of the week. Activities may include walking, swimming, or biking.  Work with your health care provider or diet and nutrition specialist (dietitian) to adjust your eating plan to your individual calorie needs. Reading food labels   Check food labels for the amount of sodium per serving. Choose foods with less than 5 percent of the Daily Value of sodium. Generally, foods with less than 300 mg of sodium per serving fit into this eating plan.  To find whole grains, look for the word "whole" as the first word in the ingredient list. Shopping  Buy products labeled as "low-sodium" or "no salt added."  Buy fresh foods. Avoid canned foods and premade or frozen meals. Cooking  Avoid adding salt when cooking. Use salt-free seasonings or herbs instead of table salt or sea salt. Check with your health care provider or pharmacist before using salt substitutes.  Do not fry foods. Cook foods using healthy methods such as baking, boiling, grilling, and broiling instead.  Cook with heart-healthy oils, such as olive, canola, soybean, or sunflower oil. Meal planning  Eat a balanced diet that includes: ? 5 or more servings of fruits and vegetables each day. At each meal, try to fill half of your plate with fruits and vegetables. ? Up  to 6-8 servings of whole grains each day. ? Less than 6 oz of lean meat, poultry, or fish each day. A 3-oz serving of meat is about the same size as a deck of cards. One egg equals 1 oz. ? 2 servings of low-fat dairy each day. ? A serving of nuts, seeds, or beans 5 times each week. ? Heart-healthy fats. Healthy fats called Omega-3 fatty acids are found in foods such as flaxseeds and coldwater fish, like sardines, salmon, and mackerel.  Limit how much you eat of the following: ? Canned or prepackaged foods. ?  Food that is high in trans fat, such as fried foods. ? Food that is high in saturated fat, such as fatty meat. ? Sweets, desserts, sugary drinks, and other foods with added sugar. ? Full-fat dairy products.  Do not salt foods before eating.  Try to eat at least 2 vegetarian meals each week.  Eat more home-cooked food and less restaurant, buffet, and fast food.  When eating at a restaurant, ask that your food be prepared with less salt or no salt, if possible. What foods are recommended? The items listed may not be a complete list. Talk with your dietitian about what dietary choices are best for you. Grains Whole-grain or whole-wheat bread. Whole-grain or whole-wheat pasta. Brown rice. Orpah Cobb. Bulgur. Whole-grain and low-sodium cereals. Pita bread. Low-fat, low-sodium crackers. Whole-wheat flour tortillas. Vegetables Fresh or frozen vegetables (raw, steamed, roasted, or grilled). Low-sodium or reduced-sodium tomato and vegetable juice. Low-sodium or reduced-sodium tomato sauce and tomato paste. Low-sodium or reduced-sodium canned vegetables. Fruits All fresh, dried, or frozen fruit. Canned fruit in natural juice (without added sugar). Meat and other protein foods Skinless chicken or [place]. Ground chicken or [place]. Pork with fat trimmed off. Fish and seafood. Egg whites. Dried beans, peas, or lentils. Unsalted nuts, nut butters, and seeds. Unsalted canned beans. Lean cuts of beef with fat trimmed off. Low-sodium, lean deli meat. Dairy Low-fat (1%) or fat-free (skim) milk. Fat-free, low-fat, or reduced-fat cheeses. Nonfat, low-sodium ricotta or cottage cheese. Low-fat or nonfat yogurt. Low-fat, low-sodium cheese. Fats and oils Soft margarine without trans fats. Vegetable oil. Low-fat, reduced-fat, or light mayonnaise and salad dressings (reduced-sodium). Canola, safflower, olive, soybean, and sunflower oils. Avocado. Seasoning and other foods Herbs. Spices. Seasoning mixes without  salt. Unsalted popcorn and pretzels. Fat-free sweets. What foods are not recommended? The items listed may not be a complete list. Talk with your dietitian about what dietary choices are best for you. Grains Baked goods made with fat, such as croissants, muffins, or some breads. Dry pasta or rice meal packs. Vegetables Creamed or fried vegetables. Vegetables in a cheese sauce. Regular canned vegetables (not low-sodium or reduced-sodium). Regular canned tomato sauce and paste (not low-sodium or reduced-sodium). Regular tomato and vegetable juice (not low-sodium or reduced-sodium). Rosita Fire. Olives. Fruits Canned fruit in a light or heavy syrup. Fried fruit. Fruit in cream or butter sauce. Meat and other protein foods Fatty cuts of meat. Ribs. Fried meat. Tomasa Blase. Sausage. Bologna and other processed lunch meats. Salami. Fatback. Hotdogs. Bratwurst. Salted nuts and seeds. Canned beans with added salt. Canned or smoked fish. Whole eggs or egg yolks. Chicken or [place] with skin. Dairy Whole or 2% milk, cream, and half-and-half. Whole or full-fat cream cheese. Whole-fat or sweetened yogurt. Full-fat cheese. Nondairy creamers. Whipped toppings. Processed cheese and cheese spreads. Fats and oils Butter. Stick margarine. Lard. Shortening. Ghee. Bacon fat. Tropical oils, such as coconut, palm kernel, or palm oil. Seasoning  and other foods Salted popcorn and pretzels. Onion salt, garlic salt, seasoned salt, table salt, and sea salt. Worcestershire sauce. Tartar sauce. Barbecue sauce. Teriyaki sauce. Soy sauce, including reduced-sodium. Steak sauce. Canned and packaged gravies. Fish sauce. Oyster sauce. Cocktail sauce. Horseradish that you find on the shelf. Ketchup. Mustard. Meat flavorings and tenderizers. Bouillon cubes. Hot sauce and Tabasco sauce. Premade or packaged marinades. Premade or packaged taco seasonings. Relishes. Regular salad dressings. Where to find more information:  National Heart, Lung, and  Blood Institute: PopSteam.is  American Heart Association: www.heart.org Summary  The DASH eating plan is a healthy eating plan that has been shown to reduce high blood pressure (hypertension). It may also reduce your risk for type 2 diabetes, heart disease, and stroke.  With the DASH eating plan, you should limit salt (sodium) intake to 2,300 mg a day. If you have hypertension, you may need to reduce your sodium intake to 1,500 mg a day.  When on the DASH eating plan, aim to eat more fresh fruits and vegetables, whole grains, lean proteins, low-fat dairy, and heart-healthy fats.  Work with your health care provider or diet and nutrition specialist (dietitian) to adjust your eating plan to your individual calorie needs. This information is not intended to replace advice given to you by your health care provider. Make sure you discuss any questions you have with your health care provider. Document Released: 06/02/2011 Document Revised: 06/06/2016 Document Reviewed: 06/06/2016 Elsevier Interactive Patient Education  2019 [employer].
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
## 2018-10-08 ENCOUNTER — Ambulatory Visit: Payer: Medicare Other | Admitting: Cardiology

## 2018-11-21 ENCOUNTER — Encounter (HOSPITAL_COMMUNITY): Payer: Self-pay | Admitting: Emergency Medicine

## 2018-11-21 ENCOUNTER — Emergency Department (HOSPITAL_COMMUNITY): Payer: Medicare Other

## 2018-11-21 ENCOUNTER — Other Ambulatory Visit: Payer: Self-pay

## 2018-11-21 ENCOUNTER — Inpatient Hospital Stay (HOSPITAL_COMMUNITY)
Admission: EM | Admit: 2018-11-21 | Discharge: 2018-12-04 | DRG: 291 | Disposition: A | Discharge status: Home Health | Payer: Medicare Other | Attending: Internal Medicine | Admitting: Internal Medicine

## 2018-11-21 DIAGNOSIS — H409 Unspecified glaucoma: Secondary | ICD-10-CM | POA: Diagnosis present

## 2018-11-21 DIAGNOSIS — I959 Hypotension, unspecified: Secondary | ICD-10-CM | POA: Diagnosis present

## 2018-11-21 DIAGNOSIS — Z66 Do not resuscitate: Secondary | ICD-10-CM | POA: Diagnosis present

## 2018-11-21 DIAGNOSIS — Z20828 Contact with and (suspected) exposure to other viral communicable diseases: Secondary | ICD-10-CM | POA: Diagnosis present

## 2018-11-21 DIAGNOSIS — R7989 Other specified abnormal findings of blood chemistry: Secondary | ICD-10-CM | POA: Diagnosis present

## 2018-11-21 DIAGNOSIS — L89620 Pressure ulcer of left heel, unstageable: Secondary | ICD-10-CM | POA: Diagnosis present

## 2018-11-21 DIAGNOSIS — I43 Cardiomyopathy in diseases classified elsewhere: Secondary | ICD-10-CM | POA: Diagnosis present

## 2018-11-21 DIAGNOSIS — I2510 Atherosclerotic heart disease of native coronary artery without angina pectoris: Secondary | ICD-10-CM | POA: Diagnosis present

## 2018-11-21 DIAGNOSIS — N179 Acute kidney failure, unspecified: Secondary | ICD-10-CM | POA: Diagnosis not present

## 2018-11-21 DIAGNOSIS — I130 Hypertensive heart and chronic kidney disease with heart failure and stage 1 through stage 4 chronic kidney disease, or unspecified chronic kidney disease: Principal | ICD-10-CM | POA: Diagnosis present

## 2018-11-21 DIAGNOSIS — Z7189 Other specified counseling: Secondary | ICD-10-CM | POA: Diagnosis not present

## 2018-11-21 DIAGNOSIS — Z8673 Personal history of transient ischemic attack (TIA), and cerebral infarction without residual deficits: Secondary | ICD-10-CM

## 2018-11-21 DIAGNOSIS — I739 Peripheral vascular disease, unspecified: Secondary | ICD-10-CM | POA: Diagnosis present

## 2018-11-21 DIAGNOSIS — E43 Unspecified severe protein-calorie malnutrition: Secondary | ICD-10-CM

## 2018-11-21 DIAGNOSIS — Q382 Macroglossia: Secondary | ICD-10-CM

## 2018-11-21 DIAGNOSIS — L89610 Pressure ulcer of right heel, unstageable: Secondary | ICD-10-CM | POA: Diagnosis present

## 2018-11-21 DIAGNOSIS — J90 Pleural effusion, not elsewhere classified: Secondary | ICD-10-CM

## 2018-11-21 DIAGNOSIS — R54 Age-related physical debility: Secondary | ICD-10-CM | POA: Diagnosis present

## 2018-11-21 DIAGNOSIS — Z6821 Body mass index (BMI) 21.0-21.9, adult: Secondary | ICD-10-CM

## 2018-11-21 DIAGNOSIS — J918 Pleural effusion in other conditions classified elsewhere: Secondary | ICD-10-CM | POA: Diagnosis present

## 2018-11-21 DIAGNOSIS — I5043 Acute on chronic combined systolic (congestive) and diastolic (congestive) heart failure: Secondary | ICD-10-CM | POA: Diagnosis present

## 2018-11-21 DIAGNOSIS — E876 Hypokalemia: Secondary | ICD-10-CM | POA: Diagnosis not present

## 2018-11-21 DIAGNOSIS — D7589 Other specified diseases of blood and blood-forming organs: Secondary | ICD-10-CM | POA: Diagnosis present

## 2018-11-21 DIAGNOSIS — D696 Thrombocytopenia, unspecified: Secondary | ICD-10-CM | POA: Diagnosis present

## 2018-11-21 DIAGNOSIS — Z515 Encounter for palliative care: Secondary | ICD-10-CM | POA: Diagnosis present

## 2018-11-21 DIAGNOSIS — N183 Chronic kidney disease, stage 3 unspecified: Secondary | ICD-10-CM | POA: Diagnosis present

## 2018-11-21 DIAGNOSIS — E785 Hyperlipidemia, unspecified: Secondary | ICD-10-CM | POA: Diagnosis present

## 2018-11-21 DIAGNOSIS — L8990 Pressure ulcer of unspecified site, unspecified stage: Secondary | ICD-10-CM

## 2018-11-21 DIAGNOSIS — R0600 Dyspnea, unspecified: Secondary | ICD-10-CM

## 2018-11-21 DIAGNOSIS — Z79899 Other long term (current) drug therapy: Secondary | ICD-10-CM

## 2018-11-21 DIAGNOSIS — I472 Ventricular tachycardia: Secondary | ICD-10-CM | POA: Diagnosis not present

## 2018-11-21 DIAGNOSIS — Z951 Presence of aortocoronary bypass graft: Secondary | ICD-10-CM

## 2018-11-21 DIAGNOSIS — Z8249 Family history of ischemic heart disease and other diseases of the circulatory system: Secondary | ICD-10-CM

## 2018-11-21 DIAGNOSIS — Z955 Presence of coronary angioplasty implant and graft: Secondary | ICD-10-CM

## 2018-11-21 DIAGNOSIS — I441 Atrioventricular block, second degree: Secondary | ICD-10-CM | POA: Diagnosis present

## 2018-11-21 DIAGNOSIS — N132 Hydronephrosis with renal and ureteral calculous obstruction: Secondary | ICD-10-CM | POA: Diagnosis present

## 2018-11-21 DIAGNOSIS — R778 Other specified abnormalities of plasma proteins: Secondary | ICD-10-CM

## 2018-11-21 DIAGNOSIS — E854 Organ-limited amyloidosis: Secondary | ICD-10-CM | POA: Diagnosis present

## 2018-11-21 DIAGNOSIS — Z9889 Other specified postprocedural states: Secondary | ICD-10-CM

## 2018-11-21 DIAGNOSIS — R7303 Prediabetes: Secondary | ICD-10-CM | POA: Diagnosis present

## 2018-11-21 DIAGNOSIS — R5381 Other malaise: Secondary | ICD-10-CM | POA: Diagnosis not present

## 2018-11-21 DIAGNOSIS — N400 Enlarged prostate without lower urinary tract symptoms: Secondary | ICD-10-CM | POA: Diagnosis present

## 2018-11-21 DIAGNOSIS — K56609 Unspecified intestinal obstruction, unspecified as to partial versus complete obstruction: Secondary | ICD-10-CM | POA: Diagnosis present

## 2018-11-21 DIAGNOSIS — I10 Essential (primary) hypertension: Secondary | ICD-10-CM

## 2018-11-21 DIAGNOSIS — R109 Unspecified abdominal pain: Secondary | ICD-10-CM

## 2018-11-21 DIAGNOSIS — Z7902 Long term (current) use of antithrombotics/antiplatelets: Secondary | ICD-10-CM

## 2018-11-21 DIAGNOSIS — I509 Heart failure, unspecified: Secondary | ICD-10-CM

## 2018-11-21 DIAGNOSIS — I471 Supraventricular tachycardia: Secondary | ICD-10-CM | POA: Diagnosis not present

## 2018-11-21 DIAGNOSIS — R609 Edema, unspecified: Secondary | ICD-10-CM | POA: Diagnosis not present

## 2018-11-21 DIAGNOSIS — R2990 Unspecified symptoms and signs involving the nervous system: Secondary | ICD-10-CM | POA: Diagnosis present

## 2018-11-21 DIAGNOSIS — Z888 Allergy status to other drugs, medicaments and biological substances status: Secondary | ICD-10-CM

## 2018-11-21 DIAGNOSIS — Z8349 Family history of other endocrine, nutritional and metabolic diseases: Secondary | ICD-10-CM

## 2018-11-21 LAB — CBC
HCT: 49.2 % (ref 39.0–52.0)
HCT: 53.3 % — ABNORMAL HIGH (ref 39.0–52.0)
Hemoglobin: 15.8 g/dL (ref 13.0–17.0)
Hemoglobin: 17.4 g/dL — ABNORMAL HIGH (ref 13.0–17.0)
MCH: 33.3 pg (ref 26.0–34.0)
MCH: 32.9 pg (ref 26.0–34.0)
MCHC: 32.1 g/dL (ref 30.0–36.0)
MCHC: 32.6 g/dL (ref 30.0–36.0)
MCV: 103.8 fL — ABNORMAL HIGH (ref 80.0–100.0)
MCV: 100.8 fL — ABNORMAL HIGH (ref 80.0–100.0)
Platelets: 141 10*3/uL — ABNORMAL LOW (ref 150–400)
Platelets: 166 10*3/uL (ref 150–400)
RBC: 4.74 MIL/uL (ref 4.22–5.81)
RBC: 5.29 MIL/uL (ref 4.22–5.81)
RDW: 15.3 % (ref 11.5–15.5)
RDW: 15.5 % (ref 11.5–15.5)
WBC: 10.8 10*3/uL — ABNORMAL HIGH (ref 4.0–10.5)
WBC: 9.1 10*3/uL (ref 4.0–10.5)
nRBC: 0 % (ref 0.0–0.2)
nRBC: 0 % (ref 0.0–0.2)

## 2018-11-21 LAB — BASIC METABOLIC PANEL
Anion gap: 15 (ref 5–15)
BUN: 32 mg/dL — ABNORMAL HIGH (ref 8–23)
CO2: 23 mmol/L (ref 22–32)
Calcium: 10.3 mg/dL (ref 8.9–10.3)
Chloride: 101 mmol/L (ref 98–111)
Creatinine, Ser: 1.13 mg/dL (ref 0.61–1.24)
GFR calc Af Amer: >60 mL/min (ref >60)
GFR calc non Af Amer: >60 mL/min (ref >60)
Glucose, Bld: 88 mg/dL (ref 70–99)
Potassium: 4.7 mmol/L (ref 3.5–5.1)
Sodium: 139 mmol/L (ref 135–145)

## 2018-11-21 LAB — SARS CORONAVIRUS 2 BY RT PCR (HOSPITAL ORDER, PERFORMED IN ~~LOC~~ HOSPITAL LAB): SARS Coronavirus 2: NEGATIVE

## 2018-11-21 LAB — TROPONIN I: Troponin I: 0.23 ng/mL (ref <0.03)

## 2018-11-21 LAB — BRAIN NATRIURETIC PEPTIDE: B Natriuretic Peptide: 1761 pg/mL — ABNORMAL HIGH (ref 0.0–100.0)

## 2018-11-21 MED ORDER — LATANOPROST 0.005 % OP SOLN
1.0000 [drp] | Freq: Every day | OPHTHALMIC | Status: DC
  Administered 2018-11-22 – 2018-12-03 (×12): 1 [drp] via OPHTHALMIC
  Filled 2018-11-21: qty 2.5

## 2018-11-21 MED ORDER — FUROSEMIDE 10 MG/ML IJ SOLN
40.0000 mg | Freq: Once | INTRAMUSCULAR | Status: AC
  Administered 2018-11-21: 20:00:00 40 mg via INTRAVENOUS
  Filled 2018-11-21: qty 4

## 2018-11-21 MED ORDER — CLOPIDOGREL BISULFATE 75 MG PO TABS
75.0000 mg | ORAL_TABLET | Freq: Every day | ORAL | Status: DC
  Administered 2018-11-22 – 2018-12-04 (×13): 75 mg via ORAL
  Filled 2018-11-21 (×13): qty 1

## 2018-11-21 MED ORDER — ENOXAPARIN SODIUM 40 MG/0.4ML ~~LOC~~ SOLN
40.0000 mg | SUBCUTANEOUS | Status: DC
  Administered 2018-11-22 – 2018-11-30 (×9): 40 mg via SUBCUTANEOUS
  Filled 2018-11-21 (×9): qty 0.4

## 2018-11-21 MED ORDER — PRO-STAT SUGAR FREE PO LIQD
30.0000 mL | Freq: Two times a day (BID) | ORAL | Status: DC
  Administered 2018-11-22 – 2018-11-23 (×3): 30 mL via ORAL
  Filled 2018-11-21 (×3): qty 30

## 2018-11-21 MED ORDER — FUROSEMIDE 10 MG/ML IJ SOLN
40.0000 mg | Freq: Two times a day (BID) | INTRAMUSCULAR | Status: DC
  Administered 2018-11-22 – 2018-11-24 (×6): 40 mg via INTRAVENOUS
  Filled 2018-11-21 (×5): qty 4

## 2018-11-21 MED ORDER — ACETAMINOPHEN 650 MG RE SUPP: 650.0000 mg | Freq: Four times a day (QID) | RECTAL | Status: DC | PRN

## 2018-11-21 MED ORDER — MIDODRINE HCL 5 MG PO TABS
5.0000 mg | ORAL_TABLET | Freq: Two times a day (BID) | ORAL | Status: DC
  Administered 2018-11-22 – 2018-12-02 (×19): 5 mg via ORAL
  Filled 2018-11-21 (×21): qty 1

## 2018-11-21 MED ORDER — ACETAMINOPHEN 325 MG PO TABS
650.0000 mg | ORAL_TABLET | Freq: Four times a day (QID) | ORAL | Status: DC | PRN
  Administered 2018-11-25 – 2018-12-03 (×3): 650 mg via ORAL
  Filled 2018-11-21 (×4): qty 2

## 2018-11-21 MED ORDER — SPIRONOLACTONE 12.5 MG HALF TABLET
12.5000 mg | ORAL_TABLET | Freq: Every day | ORAL | Status: DC
  Administered 2018-11-22 – 2018-11-27 (×6): 12.5 mg via ORAL
  Filled 2018-11-21 (×7): qty 1

## 2018-11-21 NOTE — Progress Notes (Signed)
Pt stated he did not eat anything since at ED. Heart health diet was ordered. Offered  Food but pt requested Ensure instead.  ensure was given and prostart refused.

## 2018-11-21 NOTE — ED Provider Notes (Signed)
Care assumed from previous provider Dr. Lynelle Doctor. Please see note for further details. Case discussed, plan agreed upon.  Fully, patient is a 73 year old male with history of CHF who presented to the emergency department for shortness of breath.  Troponin of 0.23.  Does appear to have baseline elevations of his troponin, this is a tad higher than 2 months ago where values were 0.18 and 0.19.  EKG had no acute ischemic changes.  Previous provider did not feel elevated troponin was due to acute coronary syndrome.  Will follow up on pending BNP and chest x-ray.  Anticipating admission for CHF exacerbation.  BNP elevated at 1761. Chest x-ray with small moderate bilateral pleural effusions.   Hospitalist consulted who will admit.   Stanley [name], Chase Picket, PA-C 11/21/18 2025    Wynetta Fines, MD 11/27/18 [phone]

## 2018-11-21 NOTE — ED Triage Notes (Signed)
Pt here via [facility], reports increased bilat leg edema and intermittent SOB x 3 weeks, SOB is worse upon lying. Home health spoke to his PCP today who told him he needs Lasix. A&O x4, no apparent distress.

## 2018-11-21 NOTE — ED Notes (Signed)
Pt reports pain on sacrum, this RN assessed and did not see any skin breakdown, barrier cream and mepilex applied.
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
## 2018-11-22 ENCOUNTER — Inpatient Hospital Stay (HOSPITAL_COMMUNITY): Payer: Medicare Other

## 2018-11-22 DIAGNOSIS — R609 Edema, unspecified: Secondary | ICD-10-CM

## 2018-11-22 DIAGNOSIS — I5043 Acute on chronic combined systolic (congestive) and diastolic (congestive) heart failure: Secondary | ICD-10-CM

## 2018-11-22 LAB — CBC
HCT: 47 % (ref 39.0–52.0)
Hemoglobin: 15.5 g/dL (ref 13.0–17.0)
MCH: 33.3 pg (ref 26.0–34.0)
MCHC: 33 g/dL (ref 30.0–36.0)
MCV: 101.1 fL — ABNORMAL HIGH (ref 80.0–100.0)
Platelets: 153 10*3/uL (ref 150–400)
RBC: 4.65 MIL/uL (ref 4.22–5.81)
RDW: 15.2 % (ref 11.5–15.5)
WBC: 9.4 10*3/uL (ref 4.0–10.5)
nRBC: 0 % (ref 0.0–0.2)

## 2018-11-22 LAB — COMPREHENSIVE METABOLIC PANEL
ALT: 40 U/L (ref 0–44)
AST: 50 U/L — ABNORMAL HIGH (ref 15–41)
Albumin: 2.3 g/dL — ABNORMAL LOW (ref 3.5–5.0)
Alkaline Phosphatase: 138 U/L — ABNORMAL HIGH (ref 38–126)
Anion gap: 11 (ref 5–15)
BUN: 32 mg/dL — ABNORMAL HIGH (ref 8–23)
CO2: 30 mmol/L (ref 22–32)
Calcium: 9.8 mg/dL (ref 8.9–10.3)
Chloride: 103 mmol/L (ref 98–111)
Creatinine, Ser: 1.26 mg/dL — ABNORMAL HIGH (ref 0.61–1.24)
GFR calc Af Amer: >60 mL/min (ref >60)
GFR calc non Af Amer: 56 mL/min — ABNORMAL LOW (ref >60)
Glucose, Bld: 85 mg/dL (ref 70–99)
Potassium: 4.1 mmol/L (ref 3.5–5.1)
Sodium: 144 mmol/L (ref 135–145)
Total Bilirubin: 5.1 mg/dL — ABNORMAL HIGH (ref 0.3–1.2)
Total Protein: 6 g/dL — ABNORMAL LOW (ref 6.5–8.1)

## 2018-11-22 LAB — TSH: TSH: 1.678 u[IU]/mL (ref 0.350–4.500)

## 2018-11-22 LAB — BASIC METABOLIC PANEL
Anion gap: 11 (ref 5–15)
BUN: 33 mg/dL — ABNORMAL HIGH (ref 8–23)
CO2: 30 mmol/L (ref 22–32)
Calcium: 9.9 mg/dL (ref 8.9–10.3)
Chloride: 102 mmol/L (ref 98–111)
Creatinine, Ser: 1.2 mg/dL (ref 0.61–1.24)
GFR calc Af Amer: >60 mL/min (ref >60)
GFR calc non Af Amer: 60 mL/min — ABNORMAL LOW (ref >60)
Glucose, Bld: 128 mg/dL — ABNORMAL HIGH (ref 70–99)
Potassium: 4.4 mmol/L (ref 3.5–5.1)
Sodium: 143 mmol/L (ref 135–145)

## 2018-11-22 LAB — TROPONIN I
Troponin I: 0.25 ng/mL (ref <0.03)
Troponin I: 0.24 ng/mL (ref <0.03)
Troponin I: <0.03 ng/mL (ref <0.03)

## 2018-11-22 LAB — CREATININE, SERUM
Creatinine, Ser: 1.23 mg/dL (ref 0.61–1.24)
GFR calc Af Amer: >60 mL/min (ref >60)
GFR calc non Af Amer: 58 mL/min — ABNORMAL LOW (ref >60)

## 2018-11-22 LAB — MAGNESIUM: Magnesium: 2 mg/dL (ref 1.7–2.4)

## 2018-11-22 MED ORDER — SODIUM CHLORIDE 0.9 % IV SOLN
100.0000 mg | Freq: Two times a day (BID) | INTRAVENOUS | Status: DC
  Filled 2018-11-22: qty 100

## 2018-11-22 MED ORDER — ALBUTEROL SULFATE (2.5 MG/3ML) 0.083% IN NEBU: 2.5000 mg | INHALATION_SOLUTION | Freq: Four times a day (QID) | RESPIRATORY_TRACT | Status: DC | PRN

## 2018-11-22 MED ORDER — SODIUM CHLORIDE 0.9 % IV SOLN
100.0000 mg | Freq: Once | INTRAVENOUS | Status: AC
  Administered 2018-11-22: 100 mg via INTRAVENOUS
  Filled 2018-11-22: qty 100

## 2018-11-22 MED ORDER — ALBUTEROL SULFATE (2.5 MG/3ML) 0.083% IN NEBU: 2.5000 mg | INHALATION_SOLUTION | Freq: Four times a day (QID) | RESPIRATORY_TRACT | Status: DC

## 2018-11-22 MED ORDER — DOXYCYCLINE HYCLATE 100 MG PO TABS
100.0000 mg | ORAL_TABLET | Freq: Two times a day (BID) | ORAL | Status: AC
  Administered 2018-11-22 – 2018-11-28 (×13): 100 mg via ORAL
  Filled 2018-11-22 (×13): qty 1

## 2018-11-22 MED ORDER — SACCHAROMYCES BOULARDII 250 MG PO CAPS
250.0000 mg | ORAL_CAPSULE | Freq: Two times a day (BID) | ORAL | Status: DC
  Administered 2018-11-22 – 2018-11-30 (×17): 250 mg via ORAL
  Filled 2018-11-22 (×17): qty 1
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
## 2018-11-22 NOTE — Progress Notes (Signed)
Bilateral lower extremity venous duplex completed. Refer to "CV Proc" under chart review to view preliminary results.  11/22/2018 2:53 PM Gertie Fey, MHA, RVT, RDCS, RDMS
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
## 2018-11-23 DIAGNOSIS — L8990 Pressure ulcer of unspecified site, unspecified stage: Secondary | ICD-10-CM

## 2018-11-23 LAB — CBC WITH DIFFERENTIAL/PLATELET
Abs Immature Granulocytes: 0.04 10*3/uL (ref 0.00–0.07)
Basophils Absolute: 0 10*3/uL (ref 0.0–0.1)
Basophils Relative: 0 %
Eosinophils Absolute: 0.1 10*3/uL (ref 0.0–0.5)
Eosinophils Relative: 1 %
HCT: 45.4 % (ref 39.0–52.0)
Hemoglobin: 14.6 g/dL (ref 13.0–17.0)
Immature Granulocytes: 1 %
Lymphocytes Relative: 6 %
Lymphs Abs: 0.4 10*3/uL — ABNORMAL LOW (ref 0.7–4.0)
MCH: 33.1 pg (ref 26.0–34.0)
MCHC: 32.2 g/dL (ref 30.0–36.0)
MCV: 102.9 fL — ABNORMAL HIGH (ref 80.0–100.0)
Monocytes Absolute: 0.9 10*3/uL (ref 0.1–1.0)
Monocytes Relative: 13 %
Neutro Abs: 5.8 10*3/uL (ref 1.7–7.7)
Neutrophils Relative %: 79 %
Platelets: 147 10*3/uL — ABNORMAL LOW (ref 150–400)
RBC: 4.41 MIL/uL (ref 4.22–5.81)
RDW: 14.9 % (ref 11.5–15.5)
WBC: 7.3 10*3/uL (ref 4.0–10.5)
nRBC: 0 % (ref 0.0–0.2)

## 2018-11-23 LAB — RENAL FUNCTION PANEL
Albumin: 2.3 g/dL — ABNORMAL LOW (ref 3.5–5.0)
Anion gap: 12 (ref 5–15)
BUN: 36 mg/dL — ABNORMAL HIGH (ref 8–23)
CO2: 33 mmol/L — ABNORMAL HIGH (ref 22–32)
Calcium: 9.8 mg/dL (ref 8.9–10.3)
Chloride: 99 mmol/L (ref 98–111)
Creatinine, Ser: 1.15 mg/dL (ref 0.61–1.24)
GFR calc Af Amer: >60 mL/min (ref >60)
GFR calc non Af Amer: >60 mL/min (ref >60)
Glucose, Bld: 78 mg/dL (ref 70–99)
Phosphorus: 2.5 mg/dL (ref 2.5–4.6)
Potassium: 3.7 mmol/L (ref 3.5–5.1)
Sodium: 144 mmol/L (ref 135–145)

## 2018-11-23 LAB — MAGNESIUM: Magnesium: 1.8 mg/dL (ref 1.7–2.4)

## 2018-11-23 MED ORDER — AMIODARONE HCL 200 MG PO TABS
200.0000 mg | ORAL_TABLET | Freq: Two times a day (BID) | ORAL | Status: DC
  Administered 2018-11-23 – 2018-12-04 (×22): 200 mg via ORAL
  Filled 2018-11-23 (×22): qty 1

## 2018-11-23 MED ORDER — ADULT MULTIVITAMIN W/MINERALS CH
1.0000 | ORAL_TABLET | Freq: Every day | ORAL | Status: DC
  Administered 2018-11-23 – 2018-12-04 (×12): 1 via ORAL
  Filled 2018-11-23 (×12): qty 1

## 2018-11-23 MED ORDER — PRO-STAT SUGAR FREE PO LIQD
30.0000 mL | Freq: Three times a day (TID) | ORAL | Status: DC
  Administered 2018-11-23 – 2018-11-28 (×13): 30 mL via ORAL
  Filled 2018-11-23 (×12): qty 30

## 2018-11-23 MED ORDER — POTASSIUM CHLORIDE CRYS ER 20 MEQ PO TBCR
40.0000 meq | EXTENDED_RELEASE_TABLET | Freq: Once | ORAL | Status: AC
  Administered 2018-11-23: 40 meq via ORAL
  Filled 2018-11-23: qty 2

## 2018-11-23 MED ORDER — ENSURE ENLIVE PO LIQD
237.0000 mL | Freq: Every day | ORAL | Status: DC
  Administered 2018-11-23 – 2018-11-27 (×5): 237 mL via ORAL

## 2018-11-23 MED ORDER — FLUTICASONE PROPIONATE 50 MCG/ACT NA SUSP
1.0000 | Freq: Every day | NASAL | Status: DC
  Administered 2018-11-23 – 2018-12-04 (×12): 1 via NASAL
  Filled 2018-11-23: qty 16

## 2018-11-23 NOTE — Progress Notes (Signed)
RN received that pt have multiple runs of V-tach within a 3 min timeframe. RN checked on pt and reported findings to floor MD. Rana Snare via web links.  RN received call back from [place] to continue to monitor pt.  If abnormal pt experience a run of V-tach again, then notify MD, so lab draw can be implemented per MD.  Pt is currently resting, without any signs of distress noted. Cardiac monitoring sheets will be placed in pt. Chart.
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
## 2018-11-23 NOTE — Progress Notes (Signed)
Per CCMD pt had 28 bt run of V-tach. EKG from monitor printed and placed in pts chart.   Upon assessment pt denies chest pain or shortness of breath at this moment.  Vitals are as follows:    11/23/18 0811  Vitals  Temp (!) 97.4 F (36.3 C)  Temp Source Oral  BP 100/64  MAP (mmHg) 76  BP Location Right Arm  BP Method Automatic  Patient Position (if appropriate) Lying  Pulse Rate 76  Resp 18  Oxygen Therapy  SpO2 100 %  O2 Device Nasal Cannula  O2 Flow Rate (L/min) 2 L/min  MEWS Score  MEWS RR 0  MEWS Pulse 0  MEWS Systolic 1  MEWS LOC 0  MEWS Temp 0  MEWS Score 1  MEWS Score Color Green   Pt is currently lying in bed, while watching television and drinking ensure.  Irene Limbo PA paged to make aware.
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
## 2018-11-23 NOTE — Care Management Important Message (Signed)
Important Message  Patient Details  Name: Stanley [name] MRN: [id number] Date of Birth: Nov 18, 1945   Medicare Important Message Given:  Yes    Suzy Kugel 11/23/2018, 1:00 PM

## 2018-11-23 NOTE — Progress Notes (Addendum)
Per CCMD pt had a wide QRS  And 31 bt run SV tach at 1458.  Nishan MD paged to make aware. Pt is currently sitting in chair watching television and requested a ginger ale. Pt denies chest pain or shortness of breath.  Monitor EKG printed and placed in pt chart.

## 2018-11-23 NOTE — Progress Notes (Signed)
Spoke with pt's daughter Autumn Patty regarding pt's plan of care with pt's permission. Questions and concerns were answered. Leotis Shames stated that she will fax the POA papers within the next hour.
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
## 2018-11-23 NOTE — Progress Notes (Addendum)
Patient continues to have asymptomatic runs of NSVT Unable to use beta blockers with low BP on midodrine Warning for long QT on order but QT normal on recent ECG;s And patient has known CAD with low EF limits AAT. Will monitor ECG/QT start amiodarone 200 bid  Review of echo suggest possible amyloid. May have restrictive Physiology can consider Technetium scan when more stable   Charlton Haws

## 2018-11-24 ENCOUNTER — Inpatient Hospital Stay (HOSPITAL_COMMUNITY): Payer: Medicare Other

## 2018-11-24 LAB — CBC WITH DIFFERENTIAL/PLATELET
Abs Immature Granulocytes: 0.03 10*3/uL (ref 0.00–0.07)
Basophils Absolute: 0 10*3/uL (ref 0.0–0.1)
Basophils Relative: 0 %
Eosinophils Absolute: 0.1 10*3/uL (ref 0.0–0.5)
Eosinophils Relative: 1 %
HCT: 46.2 % (ref 39.0–52.0)
Hemoglobin: 15.3 g/dL (ref 13.0–17.0)
Immature Granulocytes: 1 %
Lymphocytes Relative: 7 %
Lymphs Abs: 0.4 10*3/uL — ABNORMAL LOW (ref 0.7–4.0)
MCH: 33.3 pg (ref 26.0–34.0)
MCHC: 33.1 g/dL (ref 30.0–36.0)
MCV: 100.7 fL — ABNORMAL HIGH (ref 80.0–100.0)
Monocytes Absolute: 0.6 10*3/uL (ref 0.1–1.0)
Monocytes Relative: 11 %
Neutro Abs: 4.5 10*3/uL (ref 1.7–7.7)
Neutrophils Relative %: 80 %
Platelets: 134 10*3/uL — ABNORMAL LOW (ref 150–400)
RBC: 4.59 MIL/uL (ref 4.22–5.81)
RDW: 14.6 % (ref 11.5–15.5)
WBC: 5.6 10*3/uL (ref 4.0–10.5)
nRBC: 0 % (ref 0.0–0.2)

## 2018-11-24 LAB — RENAL FUNCTION PANEL
Albumin: 2.2 g/dL — ABNORMAL LOW (ref 3.5–5.0)
Anion gap: 12 (ref 5–15)
BUN: 38 mg/dL — ABNORMAL HIGH (ref 8–23)
CO2: 30 mmol/L (ref 22–32)
Calcium: 9.5 mg/dL (ref 8.9–10.3)
Chloride: 100 mmol/L (ref 98–111)
Creatinine, Ser: 1.06 mg/dL (ref 0.61–1.24)
GFR calc Af Amer: >60 mL/min (ref >60)
GFR calc non Af Amer: >60 mL/min (ref >60)
Glucose, Bld: 103 mg/dL — ABNORMAL HIGH (ref 70–99)
Phosphorus: 2.4 mg/dL — ABNORMAL LOW (ref 2.5–4.6)
Potassium: 3.6 mmol/L (ref 3.5–5.1)
Sodium: 142 mmol/L (ref 135–145)

## 2018-11-24 LAB — MAGNESIUM: Magnesium: 1.8 mg/dL (ref 1.7–2.4)

## 2018-11-24 MED ORDER — FUROSEMIDE 10 MG/ML IJ SOLN
80.0000 mg | Freq: Two times a day (BID) | INTRAMUSCULAR | Status: DC
  Administered 2018-11-24 – 2018-11-26 (×5): 80 mg via INTRAVENOUS
  Filled 2018-11-24 (×5): qty 8

## 2018-11-24 NOTE — Progress Notes (Signed)
Patient complaining about lower abdominal discomfort.   Palpated abdomen-  Area feels firm over groin area.   Did a bladder scan- 254 ml max seen on scan.   Pt passed some gas.  Patient reported some relief.   Patient has had 2 small bowel movements today-  He cannot always feel BM  After cleaning 2nd BM- pt complaining  Burning around rectum.   Will apply barrier cream and continue to monitor

## 2018-11-24 NOTE — Progress Notes (Signed)
Progress Note  Patient Name: Stanley AzureRonald J Bellmore Date of Encounter: 11/24/2018  Primary Cardiologist: Armanda Magicraci Turner, MD   Subjective   No chest pain. Breathing is better.   Inpatient Medications    Scheduled Meds: . amiodarone  200 mg Oral BID  . clopidogrel  75 mg Oral Daily  . doxycycline  100 mg Oral Q12H  . enoxaparin (LOVENOX) injection  40 mg Subcutaneous Q24H  . feeding supplement (ENSURE ENLIVE)  237 mL Oral Q1500  . feeding supplement (PRO-STAT SUGAR FREE 64)  30 mL Oral TID WC  . fluticasone  1 spray Each Nare Daily  . furosemide  40 mg Intravenous Q12H  . latanoprost  1 drop Both Eyes QHS  . midodrine  5 mg Oral BID WC  . multivitamin with minerals  1 tablet Oral Daily  . saccharomyces boulardii  250 mg Oral BID  . spironolactone  12.5 mg Oral Daily   Continuous Infusions:  PRN Meds: acetaminophen **OR** acetaminophen, albuterol   Vital Signs    Vitals:   11/23/18 1223 11/23/18 2003 11/24/18 0611 11/24/18 0809  BP: 101/69 95/65 100/74 94/71  Pulse: 79 79 75 74  Resp: 17 20 20    Temp: (!) 97.3 F (36.3 C) (!) 97.5 F (36.4 C) 98.6 F (37 C)   TempSrc: Oral Oral Oral   SpO2: 100% 100% 100% 99%  Weight:      Height:        Intake/Output Summary (Last 24 hours) at 11/24/2018 0952 Last data filed at 11/24/2018 [id number] Gross per 24 hour  Intake 240 ml  Output 1500 ml  Net -1260 ml   Filed Weights   11/21/18 2134 11/22/18 0600 11/23/18 0500  Weight: 69.2 kg 68.5 kg 69.6 kg    Telemetry    Atrial tachycardia with 2:1 AV conduction - Personally Reviewed  ECG    Atrial tachy with 2:1 AV conduction Personally Reviewed  Physical Exam   GEN: No acute distress.   Neck: No JVD Cardiac: RRR, no murmurs, rubs, or gallops.  Respiratory: Clear to auscultation bilaterally. GI: Soft, nontender, non-distended  MS: No edema; No deformity. Neuro:  Nonfocal  Psych: Normal affect   Labs    Chemistry Recent Labs  Lab 11/22/18 1455 11/23/18 0542 11/24/18  0502  NA 144 144 142  K 4.1 3.7 3.6  CL 103 99 100  CO2 30 33* 30  GLUCOSE 85 78 103*  BUN 32* 36* 38*  CREATININE 1.26* 1.15 1.06  CALCIUM 9.8 9.8 9.5  PROT 6.0*  --   --   ALBUMIN 2.3* 2.3* 2.2*  AST 50*  --   --   ALT 40  --   --   ALKPHOS 138*  --   --   BILITOT 5.1*  --   --   GFRNONAA 56* >60 >60  GFRAA >60 >60 >60  ANIONGAP 11 12 12      Hematology Recent Labs  Lab 11/22/18 0235 11/23/18 0542 11/24/18 0502  WBC 9.4 7.3 5.6  RBC 4.65 4.41 4.59  HGB 15.5 14.6 15.3  HCT 47.0 45.4 46.2  MCV 101.1* 102.9* 100.7*  MCH 33.3 33.1 33.3  MCHC 33.0 32.2 33.1  RDW 15.2 14.9 14.6  PLT 153 147* 134*    Cardiac Enzymes Recent Labs  Lab 11/21/18 1703 11/21/18 2153 11/22/18 0235 11/22/18 0848  TROPONINI 0.23* 0.25* 0.24* <0.03   No results for input(s): TROPIPOC in the last 168 hours.   BNP Recent Labs  Lab 11/21/18 1704  BNP 1,761.0*     DDimer No results for input(s): DDIMER in the last 168 hours.   Radiology    Vas [place] Lower Extremity Venous (dvt)  Result Date: 11/22/2018  Lower Venous Study Indications: Swelling.  Limitations: Poor ultrasound/tissue interface and bandages. Performing Technologist: Gertie Fey MHA, RDMS, RVT, RDCS  Examination Guidelines: A complete evaluation includes B-mode imaging, spectral Doppler, color Doppler, and power Doppler as needed of all accessible portions of each vessel. Bilateral testing is considered an integral part of a complete examination. Limited examinations for reoccurring indications may be performed as noted.  +---------+---------------+---------+-----------+----------+--------------+ RIGHT    CompressibilityPhasicitySpontaneityPropertiesSummary        +---------+---------------+---------+-----------+----------+--------------+ CFV      Full           No       Yes                  pulsatile flow +---------+---------------+---------+-----------+----------+--------------+ SFJ      Full                                                         +---------+---------------+---------+-----------+----------+--------------+ FV Prox  Full                                                        +---------+---------------+---------+-----------+----------+--------------+ FV Mid   Full                                                        +---------+---------------+---------+-----------+----------+--------------+ FV DistalFull                                                        +---------+---------------+---------+-----------+----------+--------------+ PFV      Full                                                        +---------+---------------+---------+-----------+----------+--------------+ POP                                                   Not visualized +---------+---------------+---------+-----------+----------+--------------+ PTV                                                   Not visualized +---------+---------------+---------+-----------+----------+--------------+ PERO  Not visualized +---------+---------------+---------+-----------+----------+--------------+   +---------+---------------+---------+-----------+----------+--------------+ LEFT     CompressibilityPhasicitySpontaneityPropertiesSummary        +---------+---------------+---------+-----------+----------+--------------+ CFV      Full           No       Yes                  pulsatile flow +---------+---------------+---------+-----------+----------+--------------+ SFJ      Full                                                        +---------+---------------+---------+-----------+----------+--------------+ FV Prox  Full                                                        +---------+---------------+---------+-----------+----------+--------------+ FV Mid   Full                                                         +---------+---------------+---------+-----------+----------+--------------+ FV DistalFull                                                        +---------+---------------+---------+-----------+----------+--------------+ PFV      Full                                                        +---------+---------------+---------+-----------+----------+--------------+ POP      Full           No       Yes                  pulsatile flow +---------+---------------+---------+-----------+----------+--------------+ PTV      Full                                                        +---------+---------------+---------+-----------+----------+--------------+ PERO     Full                                                        +---------+---------------+---------+-----------+----------+--------------+     Summary: Right: There is no evidence of deep vein thrombosis in the lower extremity. However, portions of this examination were limited- see technologist comments above. No cystic structure found in the popliteal fossa. Pulsatile venous flow is suggestive of possible elevated right-sided heart pressure. Left: There is no evidence of deep vein thrombosis in the  lower extremity. No cystic structure found in the popliteal fossa. Pulsatile venous flow is suggestive of possible elevated right-sided heart pressure.  *See table(s) above for measurements and observations. Electronically signed by Lemar Livings MD on 11/22/2018 at 3:31:19 PM.    Final     Cardiac Studies   none  Patient Profile     73 y.o. male admitted with worsening chronic systolic heart failure.  Assessment & Plan    1. Acute on chronic systolic CHF - his weight is unchanged in 2 days. Consider increasing IV lasix to 80 bid. 2. NSVT - he appears to be tolerating his amiodarone. 3. Atrial tachycardia - he appears to have been in this rhythm for a couple of months. If he does not return to NSR on amiodarone, we will  plan to cardiovert or perform catheter ablation. I note that his atrial rate is around 150/min so a TEE would not be required.     For questions or updates, please contact [facility] Please consult [email or web address] for contact info under Cardiology/STEMI.      Signed, Lewayne Bunting, MD  11/24/2018, 9:52 AM  Patient ID: Stanley [name], male   DOB: 1945/07/24, 73 y.o.   MRN: [id number]
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
## 2018-11-25 ENCOUNTER — Inpatient Hospital Stay (HOSPITAL_COMMUNITY): Payer: Medicare Other

## 2018-11-25 LAB — CBC WITH DIFFERENTIAL/PLATELET
Abs Immature Granulocytes: 0.03 10*3/uL (ref 0.00–0.07)
Basophils Absolute: 0 10*3/uL (ref 0.0–0.1)
Basophils Relative: 0 %
Eosinophils Absolute: 0 10*3/uL (ref 0.0–0.5)
Eosinophils Relative: 0 %
HCT: 46.8 % (ref 39.0–52.0)
Hemoglobin: 15.2 g/dL (ref 13.0–17.0)
Immature Granulocytes: 0 %
Lymphocytes Relative: 6 %
Lymphs Abs: 0.4 10*3/uL — ABNORMAL LOW (ref 0.7–4.0)
MCH: 33 pg (ref 26.0–34.0)
MCHC: 32.5 g/dL (ref 30.0–36.0)
MCV: 101.7 fL — ABNORMAL HIGH (ref 80.0–100.0)
Monocytes Absolute: 0.9 10*3/uL (ref 0.1–1.0)
Monocytes Relative: 12 %
Neutro Abs: 5.8 10*3/uL (ref 1.7–7.7)
Neutrophils Relative %: 82 %
Platelets: 166 10*3/uL (ref 150–400)
RBC: 4.6 MIL/uL (ref 4.22–5.81)
RDW: 14.6 % (ref 11.5–15.5)
WBC: 7.1 10*3/uL (ref 4.0–10.5)
nRBC: 0 % (ref 0.0–0.2)

## 2018-11-25 LAB — GRAM STAIN

## 2018-11-25 LAB — AMYLASE, PLEURAL OR PERITONEAL FLUID: Amylase, Fluid: 22 U/L

## 2018-11-25 LAB — RENAL FUNCTION PANEL
Albumin: 2.2 g/dL — ABNORMAL LOW (ref 3.5–5.0)
Anion gap: 11 (ref 5–15)
BUN: 43 mg/dL — ABNORMAL HIGH (ref 8–23)
CO2: 32 mmol/L (ref 22–32)
Calcium: 10 mg/dL (ref 8.9–10.3)
Chloride: 99 mmol/L (ref 98–111)
Creatinine, Ser: 1.12 mg/dL (ref 0.61–1.24)
GFR calc Af Amer: >60 mL/min (ref >60)
GFR calc non Af Amer: >60 mL/min (ref >60)
Glucose, Bld: 143 mg/dL — ABNORMAL HIGH (ref 70–99)
Phosphorus: 1.7 mg/dL — ABNORMAL LOW (ref 2.5–4.6)
Potassium: 3.6 mmol/L (ref 3.5–5.1)
Sodium: 142 mmol/L (ref 135–145)

## 2018-11-25 LAB — PROTEIN, PLEURAL OR PERITONEAL FLUID: Total protein, fluid: <3 g/dL

## 2018-11-25 LAB — BODY FLUID CELL COUNT WITH DIFFERENTIAL
Lymphs, Fluid: 39 %
Monocyte-Macrophage-Serous Fluid: 51 % (ref 50–90)
Neutrophil Count, Fluid: 10 % (ref 0–25)
Total Nucleated Cell Count, Fluid: 23 cu mm (ref 0–1000)

## 2018-11-25 LAB — GLUCOSE, PLEURAL OR PERITONEAL FLUID: Glucose, Fluid: 141 mg/dL

## 2018-11-25 LAB — ALBUMIN, PLEURAL OR PERITONEAL FLUID: Albumin, Fluid: <1 g/dL

## 2018-11-25 LAB — MAGNESIUM: Magnesium: 1.8 mg/dL (ref 1.7–2.4)

## 2018-11-25 MED ORDER — LIDOCAINE HCL (PF) 1 % IJ SOLN
INTRAMUSCULAR | Status: AC
  Filled 2018-11-25: qty 30

## 2018-11-25 MED ORDER — POTASSIUM & SODIUM PHOSPHATES 280-160-250 MG PO PACK
1.0000 | PACK | Freq: Three times a day (TID) | ORAL | Status: AC
  Administered 2018-11-25 – 2018-11-26 (×2): 1 via ORAL
  Filled 2018-11-25 (×3): qty 1

## 2018-11-25 MED ORDER — DOCUSATE SODIUM 100 MG PO CAPS
100.0000 mg | ORAL_CAPSULE | Freq: Two times a day (BID) | ORAL | Status: DC
  Administered 2018-11-25 – 2018-11-30 (×12): 100 mg via ORAL
  Filled 2018-11-25 (×12): qty 1

## 2018-11-25 MED ORDER — BISACODYL 10 MG RE SUPP: 10.0000 mg | Freq: Every day | RECTAL | Status: DC | PRN

## 2018-11-25 MED ORDER — PROMETHAZINE HCL 25 MG/ML IJ SOLN
12.5000 mg | Freq: Once | INTRAMUSCULAR | Status: AC
  Administered 2018-11-25: 12.5 mg via INTRAVENOUS
  Filled 2018-11-25: qty 1

## 2018-11-25 MED ORDER — ONDANSETRON 4 MG PO TBDP
4.0000 mg | ORAL_TABLET | Freq: Three times a day (TID) | ORAL | Status: DC | PRN
  Administered 2018-11-25: 4 mg via ORAL
  Filled 2018-11-25: qty 1

## 2018-11-25 MED ORDER — POTASSIUM & SODIUM PHOSPHATES 280-160-250 MG PO PACK: 1.0000 | PACK | Freq: Three times a day (TID) | ORAL | Status: DC

## 2018-11-25 NOTE — Progress Notes (Signed)
IR called - post thoracentesis- Mr. Hogenson BP dropped 75/52.  Placed in trendelenburg position- BP went up some.  See vitals signs in computer.  Page Primary MD-  He is aware

## 2018-11-25 NOTE — Procedures (Signed)
PROCEDURE SUMMARY:  Successful US guided left thoracentesis. Yielded 1.45 L of clear yellow fluid. Patient tolerated procedure well. No immediate complications. EBL = trace  Specimen was sent for labs.  Post procedure chest X-ray pending.  [place] also confirms large right effusion.  Recommend right thoracentesis tomorrow.  Gwynneth Macleod PA-C 11/25/2018 12:38 PM
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
## 2018-11-25 NOTE — Progress Notes (Signed)
Patient returned to floor - BP 94/64, transitioned patient to supine from trendelenburg  BP 98/70.    Set up frequent vital for next 2 hours   Will continue to monitor patient.   Pain level is 4 out of 10.

## 2018-11-25 NOTE — Progress Notes (Signed)
Patient started vomiting this afternoon.  Held the pro-stat this time.   Paged MD  MD placed order for zofran PRN

## 2018-11-26 ENCOUNTER — Encounter (HOSPITAL_COMMUNITY): Payer: Self-pay | Admitting: Student

## 2018-11-26 ENCOUNTER — Inpatient Hospital Stay (HOSPITAL_COMMUNITY): Payer: Medicare Other

## 2018-11-26 DIAGNOSIS — R5381 Other malaise: Secondary | ICD-10-CM

## 2018-11-26 DIAGNOSIS — J90 Pleural effusion, not elsewhere classified: Secondary | ICD-10-CM

## 2018-11-26 DIAGNOSIS — N183 Chronic kidney disease, stage 3 (moderate): Secondary | ICD-10-CM

## 2018-11-26 HISTORY — PX: IR THORACENTESIS ASP PLEURAL SPACE W/IMG GUIDE: IMG5380

## 2018-11-26 LAB — CBC WITH DIFFERENTIAL/PLATELET
Abs Immature Granulocytes: 0.02 K/uL (ref 0.00–0.07)
Basophils Absolute: 0 K/uL (ref 0.0–0.1)
Basophils Relative: 0 %
Eosinophils Absolute: 0 K/uL (ref 0.0–0.5)
Eosinophils Relative: 1 %
HCT: 46.5 % (ref 39.0–52.0)
Hemoglobin: 15.1 g/dL (ref 13.0–17.0)
Immature Granulocytes: 0 %
Lymphocytes Relative: 7 %
Lymphs Abs: 0.5 K/uL — ABNORMAL LOW (ref 0.7–4.0)
MCH: 33.3 pg (ref 26.0–34.0)
MCHC: 32.5 g/dL (ref 30.0–36.0)
MCV: 102.6 fL — ABNORMAL HIGH (ref 80.0–100.0)
Monocytes Absolute: 0.8 K/uL (ref 0.1–1.0)
Monocytes Relative: 13 %
Neutro Abs: 5.1 K/uL (ref 1.7–7.7)
Neutrophils Relative %: 79 %
Platelets: 151 K/uL (ref 150–400)
RBC: 4.53 MIL/uL (ref 4.22–5.81)
RDW: 14.5 % (ref 11.5–15.5)
WBC: 6.5 K/uL (ref 4.0–10.5)
nRBC: 0 % (ref 0.0–0.2)

## 2018-11-26 LAB — MAGNESIUM: Magnesium: 1.9 mg/dL (ref 1.7–2.4)

## 2018-11-26 LAB — RENAL FUNCTION PANEL
Albumin: 2.1 g/dL — ABNORMAL LOW (ref 3.5–5.0)
Anion gap: 11 (ref 5–15)
BUN: 51 mg/dL — ABNORMAL HIGH (ref 8–23)
CO2: 34 mmol/L — ABNORMAL HIGH (ref 22–32)
Calcium: 10 mg/dL (ref 8.9–10.3)
Chloride: 98 mmol/L (ref 98–111)
Creatinine, Ser: 1.25 mg/dL — ABNORMAL HIGH (ref 0.61–1.24)
GFR calc Af Amer: >60 mL/min
GFR calc non Af Amer: 57 mL/min — ABNORMAL LOW
Glucose, Bld: 114 mg/dL — ABNORMAL HIGH (ref 70–99)
Phosphorus: 2.8 mg/dL (ref 2.5–4.6)
Potassium: 4.1 mmol/L (ref 3.5–5.1)
Sodium: 143 mmol/L (ref 135–145)

## 2018-11-26 MED ORDER — LIDOCAINE HCL 1 % IJ SOLN
INTRAMUSCULAR | Status: AC
  Filled 2018-11-26: qty 20

## 2018-11-26 MED ORDER — LIDOCAINE HCL 1 % IJ SOLN
INTRAMUSCULAR | Status: AC | PRN
  Administered 2018-11-26: 10 mL
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
## 2018-11-26 NOTE — Progress Notes (Signed)
Pt removed O2 because he said it was hurting his nose. O2 sats WNL, will drop to 88-89%, but goes straight back up to 90's.  Vomiting stopped after the phenergan, went from brown emesis to yellow.

## 2018-11-26 NOTE — Plan of Care (Signed)
  Problem: Education: Goal: Knowledge of General Education information will improve Description Including pain rating scale, medication(s)/side effects and non-pharmacologic comfort measures Outcome: Progressing   Problem: Health Behavior/Discharge Planning: Goal: Ability to manage health-related needs will improve Outcome: Progressing   
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
## 2018-11-26 NOTE — Procedures (Signed)
PROCEDURE SUMMARY:  Successful image-guided right thoracentesis. Yielded 2.1 liters of hazy amber fluid. Patient tolerated procedure well. No immediate complications. EBL = 0 mL.  Specimen was not sent for labs. CXR ordered.  Gordy Councilman Louk PA-C 11/26/2018 12:04 PM

## 2018-11-26 NOTE — Plan of Care (Signed)
  Problem: Education: Goal: Knowledge of General Education information will improve Description: Including pain rating scale, medication(s)/side effects and non-pharmacologic comfort measures Outcome: Progressing   Problem: Health Behavior/Discharge Planning: Goal: Ability to manage health-related needs will improve Outcome: Progressing   Problem: Pain Managment: Goal: General experience of comfort will improve Outcome: Progressing   

## 2018-11-26 NOTE — Progress Notes (Signed)
Called to fluid clinic room by PA as pt is not very responsive and listless. Pt does respond to loud voice call. Pt states that he is not in pain, Pt does appear to be short of breath after thoracentesis which 2.1L fluid removed. Pt HR 60 RR 30 BP 69/47 SPO2 95% on 2L[place]. Placed pt in trendelenburg position. Pt bp improved to 93/63. Called for portable Cxr. Will call report to floor RN
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
## 2018-11-26 NOTE — Progress Notes (Signed)
Patient vomited dark brown emesis, gave phenergan, patient became a little drowsy, but took his medications when he said he was ready. Pt is requesting/tolerating ice and water. Pt later on vomited yellow emesis. Will continue to monitor, pt is now resting.

## 2018-11-27 MED ORDER — TORSEMIDE 20 MG PO TABS
20.0000 mg | ORAL_TABLET | Freq: Every day | ORAL | Status: DC
  Administered 2018-11-27: 20 mg via ORAL
  Filled 2018-11-27 (×2): qty 1
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
## 2018-11-27 NOTE — Progress Notes (Signed)
Patient in bed, heels elevated.

## 2018-11-28 ENCOUNTER — Telehealth: Payer: Self-pay

## 2018-11-28 LAB — RENAL FUNCTION PANEL
Albumin: 2.1 g/dL — ABNORMAL LOW (ref 3.5–5.0)
Anion gap: 12 (ref 5–15)
BUN: 72 mg/dL — ABNORMAL HIGH (ref 8–23)
CO2: 32 mmol/L (ref 22–32)
Calcium: 9.8 mg/dL (ref 8.9–10.3)
Chloride: 99 mmol/L (ref 98–111)
Creatinine, Ser: 1.55 mg/dL — ABNORMAL HIGH (ref 0.61–1.24)
GFR calc Af Amer: 51 mL/min — ABNORMAL LOW (ref >60)
GFR calc non Af Amer: 44 mL/min — ABNORMAL LOW (ref >60)
Glucose, Bld: 105 mg/dL — ABNORMAL HIGH (ref 70–99)
Phosphorus: 3.4 mg/dL (ref 2.5–4.6)
Potassium: 3.9 mmol/L (ref 3.5–5.1)
Sodium: 143 mmol/L (ref 135–145)

## 2018-11-28 LAB — CBC WITH DIFFERENTIAL/PLATELET
Abs Immature Granulocytes: 0.04 10*3/uL (ref 0.00–0.07)
Basophils Absolute: 0 10*3/uL (ref 0.0–0.1)
Basophils Relative: 0 %
Eosinophils Absolute: 0.1 10*3/uL (ref 0.0–0.5)
Eosinophils Relative: 1 %
HCT: 48.8 % (ref 39.0–52.0)
Hemoglobin: 15.6 g/dL (ref 13.0–17.0)
Immature Granulocytes: 1 %
Lymphocytes Relative: 5 %
Lymphs Abs: 0.3 10*3/uL — ABNORMAL LOW (ref 0.7–4.0)
MCH: 32.8 pg (ref 26.0–34.0)
MCHC: 32 g/dL (ref 30.0–36.0)
MCV: 102.5 fL — ABNORMAL HIGH (ref 80.0–100.0)
Monocytes Absolute: 0.6 10*3/uL (ref 0.1–1.0)
Monocytes Relative: 10 %
Neutro Abs: 5.5 10*3/uL (ref 1.7–7.7)
Neutrophils Relative %: 83 %
Platelets: 163 10*3/uL (ref 150–400)
RBC: 4.76 MIL/uL (ref 4.22–5.81)
RDW: 14 % (ref 11.5–15.5)
WBC: 6.6 10*3/uL (ref 4.0–10.5)
nRBC: 0 % (ref 0.0–0.2)

## 2018-11-28 LAB — MAGNESIUM: Magnesium: 1.8 mg/dL (ref 1.7–2.4)

## 2018-11-28 MED ORDER — PRO-STAT SUGAR FREE PO LIQD
30.0000 mL | Freq: Two times a day (BID) | ORAL | Status: DC
  Administered 2018-11-28 – 2018-11-30 (×3): 30 mL via ORAL
  Filled 2018-11-28 (×4): qty 30

## 2018-11-28 MED ORDER — ENSURE ENLIVE PO LIQD
237.0000 mL | Freq: Two times a day (BID) | ORAL | Status: DC
  Administered 2018-11-28 – 2018-12-04 (×9): 237 mL via ORAL

## 2018-11-28 NOTE — Telephone Encounter (Signed)
Left vm for pts daughter Leotis Shames that pt has a hospital stroke follow up appt on June 8 at 0915 with Shanda Bumps NP.. I stated it will be video due to COVID 19. I also stated pt has been admitted to hospital since 5/27 and should appt be cancel and r/s.
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
## 2018-11-28 NOTE — Care Management Important Message (Signed)
Important Message  Patient Details  Name: Stanley [name] MRN: [id number] Date of Birth: 19-Mar-1946   Medicare Important Message Given:  Yes    Dorena Bodo 11/28/2018, 2:04 PM
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
## 2018-11-28 NOTE — Plan of Care (Signed)
  Problem: Clinical Measurements: Goal: Will remain free from infection Outcome: Progressing Goal: Respiratory complications will improve Outcome: Progressing   Problem: Activity: Goal: Risk for activity intolerance will decrease Outcome: Progressing   

## 2018-11-29 ENCOUNTER — Inpatient Hospital Stay (HOSPITAL_COMMUNITY): Payer: Medicare Other

## 2018-11-29 DIAGNOSIS — I471 Supraventricular tachycardia: Secondary | ICD-10-CM

## 2018-11-29 LAB — BASIC METABOLIC PANEL
Anion gap: 14 (ref 5–15)
BUN: 83 mg/dL — ABNORMAL HIGH (ref 8–23)
CO2: 32 mmol/L (ref 22–32)
Calcium: 10.1 mg/dL (ref 8.9–10.3)
Chloride: 97 mmol/L — ABNORMAL LOW (ref 98–111)
Creatinine, Ser: 1.71 mg/dL — ABNORMAL HIGH (ref 0.61–1.24)
GFR calc Af Amer: 45 mL/min — ABNORMAL LOW (ref >60)
GFR calc non Af Amer: 39 mL/min — ABNORMAL LOW (ref >60)
Glucose, Bld: 96 mg/dL (ref 70–99)
Potassium: 4.5 mmol/L (ref 3.5–5.1)
Sodium: 143 mmol/L (ref 135–145)

## 2018-11-29 LAB — HEPATIC FUNCTION PANEL
ALT: 35 U/L (ref 0–44)
AST: 57 U/L — ABNORMAL HIGH (ref 15–41)
Albumin: 2.2 g/dL — ABNORMAL LOW (ref 3.5–5.0)
Alkaline Phosphatase: 147 U/L — ABNORMAL HIGH (ref 38–126)
Bilirubin, Direct: 2.3 mg/dL — ABNORMAL HIGH (ref 0.0–0.2)
Indirect Bilirubin: 2.5 mg/dL — ABNORMAL HIGH (ref 0.3–0.9)
Total Bilirubin: 4.8 mg/dL — ABNORMAL HIGH (ref 0.3–1.2)
Total Protein: 6.2 g/dL — ABNORMAL LOW (ref 6.5–8.1)

## 2018-11-29 LAB — LACTIC ACID, PLASMA: Lactic Acid, Venous: 2.3 mmol/L (ref 0.5–1.9)

## 2018-11-29 LAB — LIPASE, BLOOD: Lipase: 35 U/L (ref 11–51)

## 2018-11-29 MED ORDER — SODIUM CHLORIDE 0.9 % IV BOLUS
250.0000 mL | Freq: Once | INTRAVENOUS | Status: AC
  Administered 2018-11-29: 250 mL via INTRAVENOUS
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
## 2018-11-29 NOTE — Progress Notes (Signed)
Pt had 7 bts Vtach, upon assessment, pt denies Chest pain, no lightheadedness, no complains.vs as follows BP 104/71 (BP Location: Left Arm)   Pulse (!) 107   Temp 98.4 F (36.9 C) (Oral)   Resp 20   Ht 5\' 6"  (1.676 m)   Wt 61.7 kg Comment: scale a  SpO2 100%   BMI 21.95 kg/m    Will monitor.
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
## 2018-11-29 NOTE — Telephone Encounter (Signed)
Unable to reach pts son Alycia Rossetti to state appt for his father will be cancel. VM was full could not leave message. Pt is in the hospital.Pt will need to contact our office to r/s.

## 2018-11-29 NOTE — Progress Notes (Addendum)
   Mr. Mickley had asked me to call his daughter Leotis Shames to give her an update. Leotis Shames shares my concern over his rapid decline over the past year and a half, particularly accelerating in January 2020. I shared with her my concerns about potential diagnosis of amyloid and our plans for testing.   Lauren and I also talked about the patient's tendency to minimalize symptoms as he has traditionally been a "people pleaser." She said she heard from her dad's roommate that he would get all dressed up for his virtual visits or previous office visits as if he was feeling fine, then come home and collapse the rest of the day. He went from swimming regularly to using a walker and barely being able to make it until mid day before having to rest due to fatigue. She also said he began feeling like he wasn't tolerating his medicines well and so noncompliance was in question. His appetite has also been very poor. The patient apparently has been having abdominal pain for 2 days but did not tell his medical team because he did not want to bother them. He assumed everything was being all worked up already, so no need to complicate the picture.  Lauren was very grateful for call and also asked me to thank Dr. Allena Katz for his great care and listening to her concerns as well.  Haidynn Almendarez PA-C

## 2018-11-30 ENCOUNTER — Inpatient Hospital Stay (HOSPITAL_COMMUNITY): Payer: Medicare Other

## 2018-11-30 DIAGNOSIS — E43 Unspecified severe protein-calorie malnutrition: Secondary | ICD-10-CM

## 2018-11-30 LAB — CULTURE, BODY FLUID W GRAM STAIN -BOTTLE: Culture: NO GROWTH

## 2018-11-30 LAB — BASIC METABOLIC PANEL
Anion gap: 11 (ref 5–15)
BUN: 86 mg/dL — ABNORMAL HIGH (ref 8–23)
CO2: 35 mmol/L — ABNORMAL HIGH (ref 22–32)
Calcium: 10.3 mg/dL (ref 8.9–10.3)
Chloride: 96 mmol/L — ABNORMAL LOW (ref 98–111)
Creatinine, Ser: 2.01 mg/dL — ABNORMAL HIGH (ref 0.61–1.24)
GFR calc Af Amer: 37 mL/min — ABNORMAL LOW (ref >60)
GFR calc non Af Amer: 32 mL/min — ABNORMAL LOW (ref >60)
Glucose, Bld: 98 mg/dL (ref 70–99)
Potassium: 4.1 mmol/L (ref 3.5–5.1)
Sodium: 142 mmol/L (ref 135–145)

## 2018-11-30 LAB — BRAIN NATRIURETIC PEPTIDE: B Natriuretic Peptide: 2311.5 pg/mL — ABNORMAL HIGH (ref 0.0–100.0)

## 2018-11-30 MED ORDER — ENOXAPARIN SODIUM 30 MG/0.3ML ~~LOC~~ SOLN: 30.0000 mg | SUBCUTANEOUS | Status: DC

## 2018-11-30 MED ORDER — BISACODYL 10 MG RE SUPP: 10.0000 mg | Freq: Once | RECTAL | Status: DC

## 2018-11-30 MED ORDER — TECHNETIUM TC 99M PYROPHOSPHATE
21.6000 | Freq: Once | INTRAVENOUS | Status: AC | PRN
  Administered 2018-11-30: 21.6 via INTRAVENOUS
  Filled 2018-11-30: qty 22

## 2018-11-30 MED ORDER — PRO-STAT SUGAR FREE PO LIQD
30.0000 mL | Freq: Three times a day (TID) | ORAL | Status: DC
  Administered 2018-12-02 – 2018-12-04 (×7): 30 mL via ORAL
  Filled 2018-11-30 (×8): qty 30

## 2018-11-30 MED ORDER — LIDOCAINE HCL URETHRAL/MUCOSAL 2 % EX GEL
1.0000 "application " | Freq: Once | CUTANEOUS | Status: AC
  Administered 2018-11-30: 1 via TOPICAL
  Filled 2018-11-30: qty 5

## 2018-11-30 MED ORDER — LIDOCAINE HCL URETHRAL/MUCOSAL 2 % EX GEL
1.0000 "application " | Freq: Once | CUTANEOUS | Status: AC
  Filled 2018-11-30 (×2): qty 5

## 2018-11-30 NOTE — Progress Notes (Signed)
Enema completed per order, patient on BSC.

## 2018-11-30 NOTE — Progress Notes (Signed)
PT Cancellation Note  Patient Details Name: Stanley [name] MRN: [id number] DOB: [birth year]   Cancelled Treatment:    Reason Eval/Treat Not Completed: Patient at procedure or test/unavailable.  Pt was out of room and will return at another time.   Ivar Drape 11/30/2018, 1:52 PM  Samul Dada, PT MS Acute Rehab Dept. Number: [facility] [id number] and [facility] [phone]

## 2018-11-30 NOTE — Progress Notes (Addendum)
Tried to insert Foley per order however, unable to do so due to foley coiling up. Paged MD Charissa Bash and informed. Asked if possible to order coude for patient since he was complaining of pain as well

## 2018-11-30 NOTE — Progress Notes (Signed)
Verbal order  With read back given per MD Allena Katz for coude catheter, order placed. Supplies ordered.
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
## 2018-11-30 NOTE — Progress Notes (Addendum)
Have spoken with Dr. Gala Romney about patient with request for CHF team to see. Appreciate their care as I have great concern for patient's prognosis and likelihood of continuing to decline. Rise in Cr is hopefully explained by findings of bladder distention and hydronephrosis, but his BP remains too soft to re-add any CHF medication back to his regimen at present time and I worry about his likelihood of bouncing back once he leaves the hospital, without adequate HF support.  Dayna Dunn PA-C
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
## 2018-11-30 NOTE — Progress Notes (Addendum)
Coude catheter placed by two RN's. Urine output dark red/bloody. Notified MD. Will continue to observe.

## 2018-12-01 LAB — BASIC METABOLIC PANEL
Anion gap: 10 (ref 5–15)
BUN: 78 mg/dL — ABNORMAL HIGH (ref 8–23)
CO2: 37 mmol/L — ABNORMAL HIGH (ref 22–32)
Calcium: 10.2 mg/dL (ref 8.9–10.3)
Chloride: 101 mmol/L (ref 98–111)
Creatinine, Ser: 1.87 mg/dL — ABNORMAL HIGH (ref 0.61–1.24)
GFR calc Af Amer: 40 mL/min — ABNORMAL LOW (ref >60)
GFR calc non Af Amer: 35 mL/min — ABNORMAL LOW (ref >60)
Glucose, Bld: 85 mg/dL (ref 70–99)
Potassium: 3.6 mmol/L (ref 3.5–5.1)
Sodium: 148 mmol/L — ABNORMAL HIGH (ref 135–145)

## 2018-12-01 MED ORDER — POLYETHYLENE GLYCOL 3350 17 G PO PACK
17.0000 g | PACK | Freq: Every day | ORAL | Status: DC
  Administered 2018-12-01 – 2018-12-03 (×3): 17 g via ORAL
  Filled 2018-12-01 (×4): qty 1

## 2018-12-01 MED ORDER — SENNOSIDES-DOCUSATE SODIUM 8.6-50 MG PO TABS
1.0000 | ORAL_TABLET | Freq: Two times a day (BID) | ORAL | Status: DC
  Administered 2018-12-01 – 2018-12-04 (×5): 1 via ORAL
  Filled 2018-12-01 (×7): qty 1

## 2018-12-01 MED ORDER — SIMETHICONE 80 MG PO CHEW
80.0000 mg | CHEWABLE_TABLET | Freq: Four times a day (QID) | ORAL | Status: DC
  Administered 2018-12-01 – 2018-12-04 (×12): 80 mg via ORAL
  Filled 2018-12-01 (×14): qty 1

## 2018-12-01 NOTE — Plan of Care (Signed)
  Problem: Activity: Goal: Risk for activity intolerance will decrease Outcome: Progressing   Problem: Elimination: Goal: Will not experience complications related to bowel motility Outcome: Progressing   Problem: Pain Managment: Goal: General experience of comfort will improve Outcome: Progressing   Problem: Safety: Goal: Ability to remain free from injury will improve Outcome: Progressing   

## 2018-12-01 NOTE — Progress Notes (Signed)
Enema completed per order, resulting with small BM. Will continue to monitor.

## 2018-12-01 NOTE — Progress Notes (Signed)
Patient with 12 beats of VT, pt asymptomatic. MD notified
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
## 2018-12-01 NOTE — Progress Notes (Signed)
Daughter called (HPOA) and asked nurse to remove patient friend Rosary Lively from his contact list. Nurse spoke with patient before removing this contact since patient is alert and oriented. Patient stated to keep and do not remove this contact until he tells [place]. Stated that if friend wants to call we are free to share information and he is going to talk to her. Nurse decided to keep this contact on the list per patient request.

## 2018-12-01 NOTE — Progress Notes (Signed)
Patient with poor appetite, refusing  Ensure

## 2018-12-02 DIAGNOSIS — Z515 Encounter for palliative care: Secondary | ICD-10-CM

## 2018-12-02 DIAGNOSIS — Z7189 Other specified counseling: Secondary | ICD-10-CM

## 2018-12-02 LAB — BASIC METABOLIC PANEL
Anion gap: 11 (ref 5–15)
BUN: 56 mg/dL — ABNORMAL HIGH (ref 8–23)
CO2: 34 mmol/L — ABNORMAL HIGH (ref 22–32)
Calcium: 10 mg/dL (ref 8.9–10.3)
Chloride: 101 mmol/L (ref 98–111)
Creatinine, Ser: 1.47 mg/dL — ABNORMAL HIGH (ref 0.61–1.24)
GFR calc Af Amer: 54 mL/min — ABNORMAL LOW (ref >60)
GFR calc non Af Amer: 47 mL/min — ABNORMAL LOW (ref >60)
Glucose, Bld: 86 mg/dL (ref 70–99)
Potassium: 3.3 mmol/L — ABNORMAL LOW (ref 3.5–5.1)
Sodium: 146 mmol/L — ABNORMAL HIGH (ref 135–145)

## 2018-12-02 MED ORDER — MIDODRINE HCL 5 MG PO TABS: 5.0000 mg | ORAL_TABLET | Freq: Three times a day (TID) | ORAL | Status: DC

## 2018-12-02 MED ORDER — SPIRONOLACTONE 12.5 MG HALF TABLET
12.5000 mg | ORAL_TABLET | Freq: Every day | ORAL | Status: DC
  Administered 2018-12-02 – 2018-12-04 (×3): 12.5 mg via ORAL
  Filled 2018-12-02 (×3): qty 1

## 2018-12-02 MED ORDER — MIDODRINE HCL 5 MG PO TABS
5.0000 mg | ORAL_TABLET | Freq: Once | ORAL | Status: AC
  Administered 2018-12-02: 5 mg via ORAL
  Filled 2018-12-02: qty 1

## 2018-12-02 MED ORDER — POTASSIUM CHLORIDE CRYS ER 20 MEQ PO TBCR
40.0000 meq | EXTENDED_RELEASE_TABLET | Freq: Once | ORAL | Status: AC
  Administered 2018-12-02: 40 meq via ORAL
  Filled 2018-12-02: qty 2

## 2018-12-02 MED ORDER — LACTULOSE 10 GM/15ML PO SOLN
20.0000 g | Freq: Once | ORAL | Status: AC
  Administered 2018-12-02: 20 g via ORAL
  Filled 2018-12-02: qty 30

## 2018-12-02 MED ORDER — MIDODRINE HCL 5 MG PO TABS
10.0000 mg | ORAL_TABLET | Freq: Three times a day (TID) | ORAL | Status: DC
  Administered 2018-12-02 – 2018-12-04 (×8): 10 mg via ORAL
  Filled 2018-12-02 (×9): qty 2
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
## 2018-12-02 NOTE — Plan of Care (Signed)
  Problem: Health Behavior/Discharge Planning: Goal: Ability to manage health-related needs will improve Outcome: Progressing   Problem: Clinical Measurements: Goal: Will remain free from infection Outcome: Progressing   Problem: Safety: Goal: Ability to remain free from injury will improve Outcome: Progressing   Problem: Activity: Goal: Capacity to carry out activities will improve Outcome: Progressing

## 2018-12-02 NOTE — Progress Notes (Signed)
Advanced Heart Failure Rounding Note   Subjective:    Feels better. But remains weak. No SOB, orthopnea or PND. SBPs in 80s. Creatinine improving slowly. Weight down 20 pounds.   Objective:   Weight Range:  Vital Signs:   Temp:  [97.2 F (36.2 C)-97.7 F (36.5 C)] 97.7 F (36.5 C) (06/07 0816) Pulse Rate:  [60-67] 61 (06/07 0811) Resp:  [18-20] 18 (06/07 0811) BP: (79-91)/(40-64) 87/57 (06/07 0811) SpO2:  [94 %-100 %] 100 % (06/07 0811) Weight:  [60.6 kg] 60.6 kg (06/07 0518) Last BM Date: 12/01/18  Weight change: Filed Weights   11/30/18 0949 12/01/18 0427 12/02/18 0518  Weight: 62.3 kg 61.5 kg 60.6 kg    Intake/Output:   Intake/Output Summary (Last 24 hours) at 12/02/2018 1125 Last data filed at 12/02/2018 1012 Gross per 24 hour  Intake 840 ml  Output 1075 ml  Net -235 ml     Physical Exam: General:  Weak appearing. No resp difficulty HEENT: normal Neck: supple. JVP jaw . Carotids 2+ bilat; no bruits. No lymphadenopathy or thryomegaly appreciated. Cor: PMI nondisplaced. Regular rate & rhythm. No rubs, gallops or murmurs. Lungs: clear decreased at bases Abdomen: soft, nontender, nondistended. No hepatosplenomegaly. No bruits or masses. Good bowel sounds. Extremities: no cyanosis, clubbing, rash, 2+ edema Neuro: alert & orientedx3, cranial nerves grossly intact. moves all 4 extremities w/o difficulty. Affect pleasant  Telemetry:  NSR 60s Personally reviewed   Labs: Basic Metabolic Panel: Recent Labs  Lab 11/26/18 0625 11/28/18 0454 11/29/18 0654 11/30/18 0428 12/01/18 0420 12/02/18 0509  NA 143 143 143 142 148* 146*  K 4.1 3.9 4.5 4.1 3.6 3.3*  CL 98 99 97* 96* 101 101  CO2 34* 32 32 35* 37* 34*  GLUCOSE 114* 105* 96 98 85 86  BUN 51* 72* 83* 86* 78* 56*  CREATININE 1.25* 1.55* 1.71* 2.01* 1.87* 1.47*  CALCIUM 10.0 9.8 10.1 10.3 10.2 10.0  MG 1.9 1.8  --   --   --   --   PHOS 2.8 3.4  --   --   --   --     Liver Function Tests: Recent Labs   Lab 11/26/18 0625 11/28/18 0454 11/29/18 1128  AST  --   --  57*  ALT  --   --  35  ALKPHOS  --   --  147*  BILITOT  --   --  4.8*  PROT  --   --  6.2*  ALBUMIN 2.1* 2.1* 2.2*   Recent Labs  Lab 11/29/18 1128  LIPASE 35   No results for input(s): AMMONIA in the last 168 hours.  CBC: Recent Labs  Lab 11/26/18 0625 11/28/18 0454  WBC 6.5 6.6  NEUTROABS 5.1 5.5  HGB 15.1 15.6  HCT 46.5 48.8  MCV 102.6* 102.5*  PLT 151 163    Cardiac Enzymes: No results for input(s): CKTOTAL, CKMB, CKMBINDEX, TROPONINI in the last 168 hours.  BNP: BNP (last 3 results) Recent Labs    09/08/18 1124 11/21/18 1704 11/30/18 0428  BNP 1,619.6* 1,761.0* 2,311.5*    ProBNP (last 3 results) Recent Labs    12/05/17 0909 12/19/17 1236 01/31/18 1139  PROBNP 1,776* 1,961* 2,225*      Other results:  Imaging: Nm Cardiac Amyloid Tumor Loc Inflam Spect 1 Day  Result Date: 11/30/2018 CLINICAL DATA:  HEART FAILURE. CONCERN FOR CARDIAC AMYLOIDOSIS. Chronic systolic heart failure EXAM: NUCLEAR MEDICINE TUMOR LOCALIZATION. PYP CARDIAC AMYLOIDOSIS SCAN WITH SPECT TECHNIQUE: Following intravenous  administration of radiopharmaceutical, anterior planar images of the chest were obtained. Regions of interest were placed on the heart and contralateral chest wall for quantitative assessment. Additional SPECT imaging of the chest was obtained. RADIOPHARMACEUTICALS:  21.6 mCi TECHNETIUM 99 PYROPHOSPHATE FINDINGS: Planar Visual assessment: Anterior planar imaging demonstrates radiotracer uptake within the heart greater than uptake within the adjacent ribs (Grade 2). Quantitative assessment : Quantitative assessment of the cardiac uptake compared to the contralateral chest wall is equal to 2.4 (H/CL = 2.4). SPECT assessment: SPECT imaging of the chest demonstrates clear radiotracer accumulation within the LEFT ventricle. IMPRESSION: Visual and quantitative assessment (grade 2, H/CLL equal 2.4) are strongly  suggestive of transthyretin amyloidosis. Electronically Signed   By: Genevive BiStewart  Edmunds M.D.   On: 11/30/2018 15:18      Medications:     Scheduled Medications: . amiodarone  200 mg Oral BID  . clopidogrel  75 mg Oral Daily  . feeding supplement (ENSURE ENLIVE)  237 mL Oral BID BM  . feeding supplement (PRO-STAT SUGAR FREE 64)  30 mL Oral TID WC  . fluticasone  1 spray Each Nare Daily  . latanoprost  1 drop Both Eyes QHS  . midodrine  5 mg Oral BID WC  . multivitamin with minerals  1 tablet Oral Daily  . polyethylene glycol  17 g Oral Daily  . senna-docusate  1 tablet Oral BID  . simethicone  80 mg Oral QID     Infusions:   PRN Medications:  acetaminophen **OR** acetaminophen, albuterol, bisacodyl, ondansetron   Assessment/Plan:   1. A/C Systolic HF with biventricular dysfunction due to advanced TTR cardiac amyloidosis - EF has fallen from 55-60% in 2017 to 30-35% in 2020. In 2002 he had stent to RCA. He had myoview in 2019 that was negative for ischemia. Concern for amyloidsis with conduction issues, hypotension, and LVH.  - He has been diuresising with IV lasix but this has been held due to worsening renal function.  - Myeloma Panel pending. - PYP strongly positive - BP too low HF meds. Continue midodrine will increase to 10 tid - No dig with amyloid. No ARNI with elevated creatinine.  - Hold diuretics one more day - Will need genetic testing in am for TTR amyloid - Suspect he is too advanced to benefit from tafamadis.   2. AKI  -Creatinine starting to improve1.1>2.0->-> 1.47 -Follow bmet daily.   3. Pleural Effusion  -S/P Thoracentesis on 5/31 and 6/1  4. Weight loss -Over the last few months he has lost > 25 pounds. Due to amyloid>H  5. CAD Stent RCA 2002  Lexiscan Myoview 12/2017 showed no ischemia.  He is intolerant statin zetia due to myalgias and PCSK9 inhibitor cost prohibitive. No chest pain.   6. H/O Stroke 09/2018 -plavix   7. NSVT  8.  Possible A tach on admit  - quiescent on amio. Decrease to 20 daily  9. Hypokalemia - will supp.add spiro 12.5  Family meeting with Palliative Care team today. He is now DNR/DNI. Will plan for home with HHPT/NNRN in the next day or two ([facility]. Will follow closely in HF program. Can transition to Hospice as needed.   Time spent ~90 minutes,    Length of Stay: 11   Itamar Mcgowan MD 12/02/2018, 11:25 AM  Advanced Heart Failure Team Pager [phone] (M-F; 7a - 4p)  Please contact [facility] for night-coverage after hours (4p -7a ) and weekends on [email or web address]

## 2018-12-02 NOTE — Progress Notes (Signed)
Pt c/o film in mouth, no taste to food sore on upper lip and throat sore, p aged Dr Posey Pronto to advise

## 2018-12-03 ENCOUNTER — Inpatient Hospital Stay: Payer: Medicare Other | Admitting: Adult Health

## 2018-12-03 ENCOUNTER — Encounter: Payer: Self-pay | Admitting: Internal Medicine

## 2018-12-03 LAB — BASIC METABOLIC PANEL
Anion gap: 9 (ref 5–15)
BUN: 44 mg/dL — ABNORMAL HIGH (ref 8–23)
CO2: 32 mmol/L (ref 22–32)
Calcium: 10.1 mg/dL (ref 8.9–10.3)
Chloride: 103 mmol/L (ref 98–111)
Creatinine, Ser: 1.27 mg/dL — ABNORMAL HIGH (ref 0.61–1.24)
GFR calc Af Amer: >60 mL/min (ref >60)
GFR calc non Af Amer: 56 mL/min — ABNORMAL LOW (ref >60)
Glucose, Bld: 109 mg/dL — ABNORMAL HIGH (ref 70–99)
Potassium: 3.5 mmol/L (ref 3.5–5.1)
Sodium: 144 mmol/L (ref 135–145)

## 2018-12-03 LAB — MULTIPLE MYELOMA PANEL, SERUM
Albumin SerPl Elph-Mcnc: 2.4 g/dL — ABNORMAL LOW (ref 2.9–4.4)
Albumin/Glob SerPl: 1 (ref 0.7–1.7)
Alpha 1: 0.4 g/dL (ref 0.0–0.4)
Alpha2 Glob SerPl Elph-Mcnc: 0.8 g/dL (ref 0.4–1.0)
B-Globulin SerPl Elph-Mcnc: 0.8 g/dL (ref 0.7–1.3)
Gamma Glob SerPl Elph-Mcnc: 0.6 g/dL (ref 0.4–1.8)
Globulin, Total: 2.6 g/dL (ref 2.2–3.9)
IgA: 167 mg/dL (ref 61–437)
IgG (Immunoglobin G), Serum: 754 mg/dL (ref 603–1613)
IgM (Immunoglobulin M), Srm: 79 mg/dL (ref 15–143)
Total Protein ELP: 5 g/dL — ABNORMAL LOW (ref 6.0–8.5)

## 2018-12-03 NOTE — TOC Initial Note (Signed)
Transition of Care [facility]) - Initial/Assessment Note  Marvetta Gibbons RN,BSN Transitions of Care Cross Coverage 3E - RN Case Manager [phone]   Patient Details  Name: Stanley [name] MRN: [id number] Date of Birth: [birth year]  Transition of Care [facility]) CM/SW Contact:    Dawayne Patricia, RN Phone Number: 12/03/2018, 4:31 PM  Clinical Narrative:                 Pt admitted with acute on chronic HF, orders have been placed for [facility] and DME needs- PTA pt was active with [facility], CM spoke with pt at bedside list provided for [facility] choice per Per CMS guidelines from medicare.gov website with star ratings (copy placed in shadow chart)- confirmed with pt he wants to stay with [facility], pt also needs hospital bed and 3n1 which have been ordered- states daughter at home for delivery. Call made to [place] with [facility] and msg left for resumption of [place] services.   Expected Discharge Plan: [place] Barriers to Discharge: Continued Medical Work up   Patient Goals and CMS Choice Patient states their goals for this hospitalization and ongoing recovery are:: "ready to return home" CMS Medicare.gov Compare Post Acute Care list provided to:: Patient Choice offered to / list presented to : Patient  Expected Discharge Plan and Services Expected Discharge Plan: [place] In-house Referral: Clinical Social Work Discharge Planning Services: CM Consult Post Acute Care Choice: Durable Medical Equipment, Home Health Living arrangements for the past 2 months: [place]                 DME Arranged: 3-N-1, Hospital bed DME Agency: [employer] Date DME Agency Contacted: 12/03/18 Time DME Agency Contacted: 1300 Representative spoke with at DME Agency: Andree Coss HH Arranged: RN, PT, OT, Nurse's Aide, Social Work [employer] Agency: [facility] [facility] [employer] Date [address]: 12/03/18 Time [place]: 1623 Representative spoke with at [place]:  Joen Laura  Prior Living Arrangements/Services Living arrangements for the past 2 months: [place] with:: Self, Adult Children Patient language and need for interpreter reviewed:: Yes Do you feel safe going back to the place where you live?: Yes      Need for Family Participation in Patient Care: Yes (Comment) Care giver support system in place?: Yes (comment) Current home services: Home RN, Home OT, Home PT Criminal Activity/Legal Involvement Pertinent to Current Situation/Hospitalization: No - Comment as needed  Activities of Daily Living Home Assistive Devices/Equipment: None ADL Screening (condition at time of admission) Patient's cognitive ability adequate to safely complete daily activities?: Yes Is the patient deaf or have difficulty hearing?: No Does the patient have difficulty seeing, even when wearing glasses/contacts?: No Does the patient have difficulty concentrating, remembering, or making decisions?: No Patient able to express need for assistance with ADLs?: Yes Does the patient have difficulty dressing or bathing?: No Independently performs ADLs?: No Communication: Independent Dressing (OT): Independent Grooming: Independent Feeding: Independent Bathing: Needs assistance Is this a change from baseline?: Pre-admission baseline Toileting: Needs assistance Is this a change from baseline?: Pre-admission baseline In/Out Bed: Needs assistance Is this a change from baseline?: Pre-admission baseline Walks in Home: Independent Does the patient have difficulty walking or climbing stairs?: Yes Weakness of Legs: Both Weakness of Arms/Hands: None  Permission Sought/Granted Permission sought to share information with : Facility Sport and exercise psychologist, Family Supports Permission granted to share information with : Yes, Verbal Permission Granted  Share Information  with NAME: Letta MoynahanLauren Lawrance  Permission granted to share info w AGENCY: [facility]  Permission granted to share info w Relationship: daughter     Emotional Assessment Appearance:: Appears stated age Attitude/Demeanor/Rapport: Engaged Affect (typically observed): Appropriate Orientation: : Oriented to Self, Oriented to Situation, Oriented to Place, Oriented to  Time Alcohol / Substance Use: Not Applicable Psych Involvement: No (comment)  Admission diagnosis:  Elevated troponin [R79.89] Congestive heart failure, unspecified HF chronicity, unspecified heart failure type (HCC) [I50.9] Acute CHF (congestive heart failure) (HCC) [I50.9] Patient Active Problem List   Diagnosis Date Noted  . Protein-calorie malnutrition, severe 11/30/2018  . Pressure injury of skin 11/23/2018  . Acute CHF (congestive heart failure) (HCC) 11/21/2018  . Rotator cuff disorder, right 09/28/2018  . Stroke-like episode (HCC) s/p tPA, unable to get MRI to confirm/refute 09/26/2018  . Lactic acidosis 09/08/2018  . Anasarca 09/07/2018  . Elevated LFTs 09/07/2018  . CHF exacerbation (HCC) 09/07/2018  . Acute on chronic combined systolic and diastolic CHF (congestive heart failure) (HCC) 09/07/2018  . Prolonged QT interval 09/07/2018  . Cellulitis 09/07/2018  . Malnutrition of moderate degree 07/04/2018  . Ventral hernia with bowel obstruction 07/03/2018  . Incarcerated epigastric hernia 07/02/2018  . SBO (small bowel obstruction) (HCC) 07/02/2018  . CKD (chronic kidney disease) stage 3, GFR 30-59 ml/min (HCC) 07/02/2018  . Essential hypertension   . Coronary artery disease due to lipid rich plaque   . Peripheral arterial disease (HCC) 10/26/2017  . Morbid obesity due to excess calories (HCC) 01/25/2017  . Upper airway cough syndrome 01/24/2017  . CAD (coronary artery disease), native coronary artery 03/21/2016  . Chronic combined systolic and diastolic CHF (congestive heart failure) (HCC)   . Dyspnea on exertion   . Thrombocytopenia (HCC)   . Elevated troponin 12/05/2015  .  Hyperlipidemia   . Essential hypertension, benign 10/16/2013  . Encounter for long-term (current) use of other medications 10/16/2013  . Nevus, non-neoplastic 07/25/2012  . Varicose veins of lower extremities with other complications 06/25/2012  . Leg pain, bilateral 06/25/2012  . Swelling of limb 03/12/2012  . Cholelithiasis 02/10/2011   PCP:  Daisy Florooss, Charles Alan, MD Pharmacy:   [facility] [phone][id number] [address]- [place], [place] - [phone] [facility] [address] [facility] [address]  [place] [id number] Phone: [phone] Fax: [phone]  [facility] [place] - [id number] [place] [address] [place] [id number] Phone: [phone] Fax: [phone]     Social Determinants of Health (SDOH) Interventions    Readmission Risk Interventions No flowsheet data found.

## 2018-12-03 NOTE — Progress Notes (Signed)
   Durable Medical Equipment (From admission, onward)       Start     Ordered  12/03/18 1253   For home use only DME Bedside commode  Once   Question:  Patient needs a bedside commode to treat with the following condition  Answer:  CHF (congestive heart failure) ([place])  12/03/18 1253  12/03/18 1252   For home use only DME Hospital bed  Once   Question Answer Comment Length of Need Lifetime  Patient has (list medical condition): decomensated CHF  The above medical condition requires: Patient requires the ability to reposition frequently  Head must be elevated greater than: 30 degrees  Bed type Semi-electric  Support Surface: Gel Overlay    12/03/18 1251
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
## 2018-12-03 NOTE — Care Management Important Message (Signed)
Important Message  Patient Details  Name: Stanley [name] MRN: [id number] Date of Birth: Oct 28, 1945   Medicare Important Message Given:  Yes    [name] 12/03/2018, 11:59 AM

## 2018-12-03 NOTE — Progress Notes (Signed)
Advanced Heart Failure Rounding Note   Subjective:    Feels better. But remains weak. No SOB, orthopnea or PND. SBPs in 80s. Creatinine improving slowly. Weight down 20 pounds.   Objective:   Weight Range:  Vital Signs:   Temp:  [97.4 F (36.3 C)-97.6 F (36.4 C)] 97.4 F (36.3 C) (06/08 1127) Pulse Rate:  [58-71] 67 (06/08 1127) Resp:  [16-18] 16 (06/08 1127) BP: (98-109)/(60-74) 98/61 (06/08 1127) SpO2:  [98 %-100 %] 98 % (06/08 1127) Weight:  [61.9 kg] 61.9 kg (06/08 0600) Last BM Date: 12/03/18  Weight change: Filed Weights   12/01/18 0427 12/02/18 0518 12/03/18 0600  Weight: 61.5 kg 60.6 kg 61.9 kg    Intake/Output:   Intake/Output Summary (Last 24 hours) at 12/03/2018 1207 Last data filed at 12/03/2018 1000 Gross per 24 hour  Intake 840 ml  Output 850 ml  Net -10 ml     Physical Exam: General:  Weak appearing. No resp difficulty HEENT: normal Neck: supple. no JVP 7-8 . Carotids 2+ bilat; no bruits. No lymphadenopathy or thryomegaly appreciated. Cor: PMI nondisplaced. Regular rate & rhythm. No rubs, gallops or murmurs. Lungs: clear Abdomen: soft, nontender, nondistended. No hepatosplenomegaly. No bruits or masses. Good bowel sounds. Extremities: no cyanosis, clubbing, rash, 1+ edema Neuro: alert & orientedx3, cranial nerves grossly intact. moves all 4 extremities w/o difficulty. Affect pleasant   Telemetry:  NSR 60s Personally reviewed   Labs: Basic Metabolic Panel: Recent Labs  Lab 11/28/18 0454 11/29/18 0654 11/30/18 0428 12/01/18 0420 12/02/18 0509 12/03/18 0515  NA 143 143 142 148* 146* 144  K 3.9 4.5 4.1 3.6 3.3* 3.5  CL 99 97* 96* 101 101 103  CO2 32 32 35* 37* 34* 32  GLUCOSE 105* 96 98 85 86 109*  BUN 72* 83* 86* 78* 56* 44*  CREATININE 1.55* 1.71* 2.01* 1.87* 1.47* 1.27*  CALCIUM 9.8 10.1 10.3 10.2 10.0 10.1  MG 1.8  --   --   --   --   --   PHOS 3.4  --   --   --   --   --     Liver Function Tests: Recent Labs  Lab 11/28/18  0454 11/29/18 1128  AST  --  57*  ALT  --  35  ALKPHOS  --  147*  BILITOT  --  4.8*  PROT  --  6.2*  ALBUMIN 2.1* 2.2*   Recent Labs  Lab 11/29/18 1128  LIPASE 35   No results for input(s): AMMONIA in the last 168 hours.  CBC: Recent Labs  Lab 11/28/18 0454  WBC 6.6  NEUTROABS 5.5  HGB 15.6  HCT 48.8  MCV 102.5*  PLT 163    Cardiac Enzymes: No results for input(s): CKTOTAL, CKMB, CKMBINDEX, TROPONINI in the last 168 hours.  BNP: BNP (last 3 results) Recent Labs    09/08/18 1124 11/21/18 1704 11/30/18 0428  BNP 1,619.6* 1,761.0* 2,311.5*    ProBNP (last 3 results) Recent Labs    12/05/17 0909 12/19/17 1236 01/31/18 1139  PROBNP 1,776* 1,961* 2,225*      Other results:  Imaging: No results found.   Medications:     Scheduled Medications: . amiodarone  200 mg Oral BID  . clopidogrel  75 mg Oral Daily  . feeding supplement (ENSURE ENLIVE)  237 mL Oral BID BM  . feeding supplement (PRO-STAT SUGAR FREE 64)  30 mL Oral TID WC  . fluticasone  1 spray Each Nare Daily  .  latanoprost  1 drop Both Eyes QHS  . midodrine  10 mg Oral TID WC  . multivitamin with minerals  1 tablet Oral Daily  . polyethylene glycol  17 g Oral Daily  . senna-docusate  1 tablet Oral BID  . simethicone  80 mg Oral QID  . spironolactone  12.5 mg Oral Daily    Infusions:   PRN Medications: acetaminophen **OR** acetaminophen, albuterol, bisacodyl, ondansetron   Assessment/Plan:   1. A/C Systolic HF with biventricular dysfunction due to advanced TTR cardiac amyloidosis - EF has fallen from 55-60% in 2017 to 30-35% in 2020. In 2002 he had stent to RCA. He had myoview in 2019 that was negative for ischemia. Concern for amyloidsis with conduction issues, hypotension, and LVH.  - He has been diuresising with IV lasix but this has been held due to worsening renal function.  - Myeloma Panel pending. - PYP strongly positive - BP too low HF meds. Continue midodrine 10 tid -  No dig with amyloid. No ARNI with elevated creatinine.  - Hold diuretics one more day - Will need genetic testing in am for TTR amyloid - Suspect he is too advanced to benefit from tafamadis.  - start torsemide 20 daily tomorrow. - He is stable to go home today or tomorrow. [facility]   2. AKI  -Creatinine starting to improve1.1>2.0->-> 1.47-> 1.27  3. Pleural Effusion  -S/P Thoracentesis on 5/31 and 6/1  4. Weight loss -Over the last few months he has lost > 25 pounds. Due to amyloid>H  5. CAD Stent RCA 2002  Lexiscan Myoview 12/2017 showed no ischemia.  He is intolerant statin zetia due to myalgias and PCSK9 inhibitor cost prohibitive. No chest pain.   6. H/O Stroke 09/2018 -plavix   7. NSVT  8. Possible A tach on admit  - quiescent on amio. Continue 200mg  daily  9. Hypokalemia - will supp. - continue spiro 12.5  HF meds on d/c  Spiro 12.5 daily Torsemide 20 daily (take extra as needed) Amiodarone 200 mg daily  Midodrine 10 tid Plavix 75 mg daily  F/u HF clinic next week.   Family meeting with Palliative Care team yesterday. He is now DNR/DNI. Will plan for home with HHPT/NNRN in the next day or two ([facility]. Will follow closely in HF program. Can transition to Hospice as needed.   Time spent ~90 minutes,    Length of Stay: 12   Arvilla Meresaniel Tiwan Schnitker MD 12/03/2018, 12:07 PM  Advanced Heart Failure Team Pager [phone] (M-F; 7a - 4p)  Please contact [facility] for night-coverage after hours (4p -7a ) and weekends on [email or web address]

## 2018-12-03 NOTE — Plan of Care (Signed)
  Problem: Elimination: Goal: Will not experience complications related to bowel motility Outcome: Progressing   Problem: Safety: Goal: Ability to remain free from injury will improve Outcome: Progressing   
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
## 2018-12-04 LAB — BASIC METABOLIC PANEL
Anion gap: 10 (ref 5–15)
BUN: 41 mg/dL — ABNORMAL HIGH (ref 8–23)
CO2: 31 mmol/L (ref 22–32)
Calcium: 10.1 mg/dL (ref 8.9–10.3)
Chloride: 104 mmol/L (ref 98–111)
Creatinine, Ser: 1.24 mg/dL (ref 0.61–1.24)
GFR calc Af Amer: >60 mL/min (ref >60)
GFR calc non Af Amer: 57 mL/min — ABNORMAL LOW (ref >60)
Glucose, Bld: 91 mg/dL (ref 70–99)
Potassium: 4.7 mmol/L (ref 3.5–5.1)
Sodium: 145 mmol/L (ref 135–145)

## 2018-12-04 MED ORDER — FLUTICASONE PROPIONATE 50 MCG/ACT NA SUSP: 1.0000 | Freq: Every day | NASAL | 0 refills | Status: AC

## 2018-12-04 MED ORDER — FUROSEMIDE 40 MG PO TABS: 40.0000 mg | ORAL_TABLET | Freq: Every day | ORAL | 0 refills | Status: DC | PRN

## 2018-12-04 MED ORDER — POLYETHYLENE GLYCOL 3350 17 G PO PACK: 17.0000 g | PACK | Freq: Every day | ORAL | 0 refills | Status: AC

## 2018-12-04 MED ORDER — MIDODRINE HCL 10 MG PO TABS: 10.0000 mg | ORAL_TABLET | Freq: Three times a day (TID) | ORAL | 0 refills | Status: AC

## 2018-12-04 MED ORDER — SIMETHICONE 80 MG PO CHEW: 80.0000 mg | CHEWABLE_TABLET | Freq: Four times a day (QID) | ORAL | 0 refills | Status: AC | PRN
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
## 2018-12-04 NOTE — TOC Transition Note (Signed)
Transition of Care [facility]) - CM/SW Discharge Note   Patient Details  Name: Stanley [name] MRN: [id number] Date of Birth: [birth year]  Transition of Care [facility]) CM/SW Contact:  Sherrilyn Rist Phone Number: [phone] 12/04/2018, 3:03 PM   Clinical Narrative:    CM talked to [place] with [employer], hospital bed and 3:1 is scheduled to be delivered to the home today; [place] arranged with [facility] at home.   Final next level of care: [place] Barriers to Discharge: Continued Medical Work up   Patient Goals and CMS Choice Patient states their goals for this hospitalization and ongoing recovery are:: "ready to return home" CMS Medicare.gov Compare Post Acute Care list provided to:: Patient Choice offered to / list presented to : Patient  Discharge Placement                       Discharge Plan and Services In-house Referral: Clinical Social Work Discharge Planning Services: CM Consult Post Acute Care Choice: Durable Medical Equipment, Home Health          DME Arranged: 3-N-1, Hospital bed DME Agency: [employer] Date DME Agency Contacted: 12/03/18 Time DME Agency Contacted: 1300 Representative spoke with at DME Agency: [place]: RN, PT, OT, Nurse's Aide, Social Work [employer] Agency: [facility] [employer] [facility] [employer] Date [place]: 12/03/18 Time [place]: 1623 Representative spoke with at [place]: [place] ([place]) Interventions     Readmission Risk Interventions No flowsheet data found.

## 2018-12-04 NOTE — Discharge Summary (Signed)
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  +---------+---------------+---------+-----------+----------+--------------+  SFJ       Full                                                             +---------+---------------+---------+-----------+----------+--------------+  FV Prox   Full                                                             +---------+---------------+---------+-----------+----------+--------------+  FV Mid    Full                                                             +---------+---------------+---------+-----------+----------+--------------+  FV Distal Full                                                             +---------+---------------+---------+-----------+----------+--------------+  PFV       Full                                                              +---------+---------------+---------+-----------+----------+--------------+  POP                                                        Not visualized  +---------+---------------+---------+-----------+----------+--------------+  PTV                                                        Not visualized  +---------+---------------+---------+-----------+----------+--------------+  PERO                                                       Not visualized  +---------+---------------+---------+-----------+----------+--------------+   +---------+---------------+---------+-----------+----------+--------------+  LEFT      Compressibility Phasicity Spontaneity Properties Summary         +---------+---------------+---------+-----------+----------+--------------+  CFV       Full            No        Yes                    pulsatile flow  +---------+---------------+---------+-----------+----------+--------------+  SFJ       Full                                                             +---------+---------------+---------+-----------+----------+--------------+  FV Prox   Full                                                             +---------+---------------+---------+-----------+----------+--------------+  FV Mid    Full                                                             +---------+---------------+---------+-----------+----------+--------------+  FV  Distal Full                                                             +---------+---------------+---------+-----------+----------+--------------+  PFV       Full                                                             +---------+---------------+---------+-----------+----------+--------------+  POP       Full            No        Yes                    pulsatile flow  +---------+---------------+---------+-----------+----------+--------------+  PTV       Full                                                              +---------+---------------+---------+-----------+----------+--------------+  PERO      Full                                                             +---------+---------------+---------+-----------+----------+--------------+     Summary: Right: There is no evidence of deep vein thrombosis in the lower extremity. However, portions of this examination were limited- see technologist comments above. No cystic structure found in the popliteal fossa. Pulsatile venous flow is suggestive of possible elevated right-sided heart pressure. Left: There is no evidence of deep vein thrombosis in the lower extremity. No cystic structure found in the popliteal fossa. Pulsatile venous flow is suggestive of possible elevated right-sided heart pressure.  *See table(s) above for measurements and observations. Electronically signed by Lemar LivingsBrandon Cain MD on 11/22/2018 at 3:31:19 PM.    Final    Ir Thoracentesis Asp Pleural Space W/img Guide  Result Date: 11/26/2018 INDICATION: Patient with history of acute on chronic systolic/diastolic HF and bilateral pleural effusions (left side drained 11/25/2018). Request is made for therapeutic right thoracentesis. EXAM: ULTRASOUND GUIDED THERAPEUTIC RIGHT THORACENTESIS MEDICATIONS: 10 mL 1% lidocaine COMPLICATIONS: None immediate. PROCEDURE: An ultrasound guided thoracentesis was thoroughly discussed with the patient and questions answered. The benefits, risks, alternatives and complications were also discussed. The patient understands and wishes to proceed with the procedure. Written consent was obtained. Ultrasound was performed to localize and mark an adequate pocket of fluid in the right chest. The area was then prepped and draped in the normal sterile fashion. 1% Lidocaine was used for local anesthesia. Under ultrasound guidance a 6 Fr Safe-T-Centesis catheter was introduced. Thoracentesis was performed. The catheter was removed and a dressing applied. FINDINGS: A total of approximately 2.1 L  of hazy amber fluid was removed. IMPRESSION: Successful ultrasound guided right thoracentesis yielding 2.1 L of pleural fluid.  Read by: Elwin Mocha, PA-C Electronically Signed   By: Judie Petit.  Shick M.D.   On: 11/26/2018 12:06   US Thoracentesis Asp Pleural Space W/img Guide  Result Date: 11/25/2018 INDICATION: Acute on chronic systolic and diastolic congestive heart failure. Bilateral pleural effusions. Request for diagnostic and therapeutic thoracentesis. EXAM: ULTRASOUND GUIDED LEFT THORACENTESIS MEDICATIONS: 1% lidocaine 10 mL COMPLICATIONS: None immediate. PROCEDURE: An ultrasound guided thoracentesis was thoroughly discussed with the patient and questions answered. The benefits, risks, alternatives and complications were also discussed. The patient understands and wishes to proceed with the procedure. Written consent was obtained. Ultrasound was performed to localize and mark an adequate pocket of fluid in the left chest. The area was then prepped and draped in the normal sterile fashion. 1% Lidocaine was used for local anesthesia. Under ultrasound guidance a 6 Fr Safe-T-Centesis catheter was introduced. Thoracentesis was performed. The catheter was removed and a dressing applied. FINDINGS: A total of approximately 1.45 L of clear yellow fluid was removed. Samples were sent to the laboratory as requested by the clinical team. IMPRESSION: Successful ultrasound guided left thoracentesis yielding 1.45 L of pleural fluid. No pneumothorax on post-procedure chest x-ray. Read by: Corrin Parker, PA-C Electronically Signed   By: Jolaine Click M.D.   On: 11/25/2018 13:38    Microbiology: Recent Results (from the past 240 hour(s))  Culture, body fluid-bottle     Status: None   Collection Time: 11/25/18 12:43 PM  Result Value Ref Range Status   Specimen Description PLEURAL LEFT  Final   Special Requests NONE  Final   Culture   Final    NO GROWTH 5 DAYS Performed at [facility], [address],  [place], [place] [address]    Report Status 11/30/2018 FINAL  Final  Gram stain     Status: None   Collection Time: 11/25/18 12:43 PM  Result Value Ref Range Status   Specimen Description PLEURAL LEFT  Final   Special Requests NONE  Final   Gram Stain   Final    RARE WBC PRESENT, PREDOMINANTLY PMN NO ORGANISMS SEEN Performed at [facility], [address] [place], [place] [address]    Report Status 11/25/2018 FINAL  Final     Labs: CBC: Recent Labs  Lab 11/28/18 0454  WBC 6.6  NEUTROABS 5.5  HGB 15.6  HCT 48.8  MCV 102.5*  PLT 163   Basic Metabolic Panel: Recent Labs  Lab 11/28/18 0454  11/30/18 0428 12/01/18 0420 12/02/18 0509 12/03/18 0515 12/04/18 0319  NA 143   < > 142 148* 146* 144 145  K 3.9   < > 4.1 3.6 3.3* 3.5 4.7  CL 99   < > 96* 101 101 103 104  CO2 32   < > 35* 37* 34* 32 31  GLUCOSE 105*   < > 98 85 86 109* 91  BUN 72*   < > 86* 78* 56* 44* 41*  CREATININE 1.55*   < > 2.01* 1.87* 1.47* 1.27* 1.24  CALCIUM 9.8   < > 10.3 10.2 10.0 10.1 10.1  MG 1.8  --   --   --   --   --   --   PHOS 3.4  --   --   --   --   --   --    < > = values in this interval not displayed.   Liver Function Tests: Recent Labs  Lab 11/28/18 0454 11/29/18 1128  AST  --  57*  ALT  --  35  ALKPHOS  --  147*  BILITOT  --  4.8*  PROT  --  6.2*  ALBUMIN 2.1* 2.2*   Recent Labs  Lab 11/29/18 1128  LIPASE 35   No results for input(s): AMMONIA in the last 168 hours. Cardiac Enzymes: No results for input(s): CKTOTAL, CKMB, CKMBINDEX, TROPONINI in the last 168 hours. BNP (last 3 results) Recent Labs    09/08/18 1124 11/21/18 1704 11/30/18 0428  BNP 1,619.6* 1,761.0* 2,311.5*   CBG: No results for input(s): GLUCAP in the last 168 hours. Time spent: 35 minutes  Signed:  Berle Mull  [facility]  12/04/2018

## 2018-12-04 NOTE — Progress Notes (Signed)
Advanced Heart Failure Rounding Note   Subjective:    Feels good. Says it has been a busy day but he is positive about it. Weak at times but denies SOB, orthopnea or PND.   Weight down a pound.   Objective:   Weight Range:  Vital Signs:   Temp:  [97.6 F (36.4 C)-98 F (36.7 C)] 97.6 F (36.4 C) (06/09 1111) Pulse Rate:  [60-73] 73 (06/09 1111) Resp:  [16-18] 16 (06/09 1111) BP: (92-97)/(60-62) 97/62 (06/09 1111) SpO2:  [97 %-99 %] 97 % (06/09 1111) Weight:  [61.2 kg] 61.2 kg (06/09 0335) Last BM Date: 12/03/18  Weight change: Filed Weights   12/02/18 0518 12/03/18 0600 12/04/18 0335  Weight: 60.6 kg 61.9 kg 61.2 kg    Intake/Output:   Intake/Output Summary (Last 24 hours) at 12/04/2018 1952 Last data filed at 12/04/2018 1420 Gross per 24 hour  Intake 677 ml  Output 625 ml  Net 52 ml     Physical Exam: General:  Lying flat in bed  No resp difficulty HEENT: normal Neck: supple. JVP 7. Carotids 2+ bilat; no bruits. No lymphadenopathy or thryomegaly appreciated. Cor: PMI nondisplaced. Regular rate & rhythm. 2/6 TR. Lungs: clear Abdomen: soft, nontender, nondistended. No hepatosplenomegaly. No bruits or masses. Good bowel sounds. Extremities: no cyanosis, clubbing, rash, 1+ edema Neuro: alert & orientedx3, cranial nerves grossly intact. moves all 4 extremities w/o difficulty. Affect pleasant    Telemetry:  NSR 60-70s Personally reviewed   Labs: Basic Metabolic Panel: Recent Labs  Lab 11/28/18 0454  11/30/18 0428 12/01/18 0420 12/02/18 0509 12/03/18 0515 12/04/18 0319  NA 143   < > 142 148* 146* 144 145  K 3.9   < > 4.1 3.6 3.3* 3.5 4.7  CL 99   < > 96* 101 101 103 104  CO2 32   < > 35* 37* 34* 32 31  GLUCOSE 105*   < > 98 85 86 109* 91  BUN 72*   < > 86* 78* 56* 44* 41*  CREATININE 1.55*   < > 2.01* 1.87* 1.47* 1.27* 1.24  CALCIUM 9.8   < > 10.3 10.2 10.0 10.1 10.1  MG 1.8  --   --   --   --   --   --   PHOS 3.4  --   --   --   --   --   --    < > = values in this interval not displayed.    Liver Function Tests: Recent Labs  Lab 11/28/18 0454 11/29/18 1128  AST  --  57*  ALT  --  35  ALKPHOS  --  147*  BILITOT  --  4.8*  PROT  --  6.2*  ALBUMIN 2.1* 2.2*   Recent Labs  Lab 11/29/18 1128  LIPASE 35   No results for input(s): AMMONIA in the last 168 hours.  CBC: Recent Labs  Lab 11/28/18 0454  WBC 6.6  NEUTROABS 5.5  HGB 15.6  HCT 48.8  MCV 102.5*  PLT 163    Cardiac Enzymes: No results for input(s): CKTOTAL, CKMB, CKMBINDEX, TROPONINI in the last 168 hours.  BNP: BNP (last 3 results) Recent Labs    09/08/18 1124 11/21/18 1704 11/30/18 0428  BNP 1,619.6* 1,761.0* 2,311.5*    ProBNP (last 3 results) Recent Labs    12/05/17 0909 12/19/17 1236 01/31/18 1139  PROBNP 1,776* 1,961* 2,225*      Other results:  Imaging: No results found.  Medications:     Scheduled Medications: . amiodarone  200 mg Oral BID  . clopidogrel  75 mg Oral Daily  . feeding supplement (ENSURE ENLIVE)  237 mL Oral BID BM  . feeding supplement (PRO-STAT SUGAR FREE 64)  30 mL Oral TID WC  . fluticasone  1 spray Each Nare Daily  . latanoprost  1 drop Both Eyes QHS  . midodrine  10 mg Oral TID WC  . multivitamin with minerals  1 tablet Oral Daily  . polyethylene glycol  17 g Oral Daily  . senna-docusate  1 tablet Oral BID  . simethicone  80 mg Oral QID  . spironolactone  12.5 mg Oral Daily    Infusions:   PRN Medications: acetaminophen **OR** acetaminophen, albuterol, bisacodyl, ondansetron   Assessment/Plan:   1. 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 Genetic testing has been sent - BP too low HF meds. Continue midodrine 10 tid - No dig with amyloid. No ARNI with elevated creatinine.  - Volume status  ok - Suspect he is too advanced to benefit from tafamadis.  - Start torsemide 20 daily today. - He is stable to go home today,. Would enroll in [place] HF program   2. AKI  -Creatinine improved to 1.2  3. Pleural Effusion  -S/P Thoracentesis on 5/31 and 6/1  4. CAD Stent RCA 2002  Lexiscan Myoview 12/2017 showed no ischemia.  He is intolerant statin zetia due to myalgias and PCSK9 inhibitor cost prohibitive. No s/s ischmia  6. H/O Stroke 09/2018 -plavix   7. NSVT  8. Possible A tach on admit  - quiescent on amio. Continue 200mg  daily  9. DNR/DNI  Ok for d/c today on  Spiro 12.5 daily Torsemide 20 daily (take extra as needed) Amiodarone 200 mg daily  Midodrine 10 tid Plavix 75 mg daily  F/u HF clinic next week with labs  Length of Stay: 13   Darriona Dehaas MD 12/04/2018, 7:52 PM  Advanced Heart Failure Team Pager [phone] (M-F; 7a - 4p)  Please contact [facility] for night-coverage after hours (4p -7a ) and weekends on [email or web address]

## 2018-12-04 NOTE — TOC Transition Note (Signed)
Transition of Care [facility]) - CM/SW Discharge Note   Patient Details  Name: Stanley [name] MRN: [id number] Date of Birth: Nov 25, 1945  Transition of Care [facility]) CM/SW Contact:  Sherrilyn Rist Phone Number: [phone] 12/04/2018, 4:43 PM   Clinical Narrative:    CM talked to patient about Outpatient Palliative Care, he is agreeable; choice offered, pt chose [facility]; Referral placed as requested; CM talked to Jaclynn Major with Hospice for arrangements of Outpatient services; [place] arranged with [facility].   Final next level of care: [place] Barriers to Discharge: Continued Medical Work up   Patient Goals and CMS Choice Patient states their goals for this hospitalization and ongoing recovery are:: "ready to return home" CMS Medicare.gov Compare Post Acute Care list provided to:: Patient Choice offered to / list presented to : Patient  Discharge Placement                       Discharge Plan and Services In-house Referral: Clinical Social Work Discharge Planning Services: CM Consult Post Acute Care Choice: Durable Medical Equipment, Home Health          DME Arranged: 3-N-1, Hospital bed DME Agency: [employer] Date DME Agency Contacted: 12/03/18 Time DME Agency Contacted: 1300 Representative spoke with at DME Agency: [place]: RN, PT, OT, Nurse's Aide, Social Work [employer] Agency: [facility] [employer] [facility] [employer] Date [place]: 12/03/18 Time [place]: 1623 Representative spoke with at [place]: [place] ([place]) Interventions     Readmission Risk Interventions No flowsheet data found.

## 2018-12-06 ENCOUNTER — Encounter: Payer: Self-pay | Admitting: *Deleted

## 2018-12-06 DIAGNOSIS — Z006 Encounter for examination for normal comparison and control in clinical research program: Secondary | ICD-10-CM
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
## 2018-12-10 ENCOUNTER — Ambulatory Visit (HOSPITAL_COMMUNITY)
Admission: RE | Admit: 2018-12-10 | Discharge: 2018-12-10 | Disposition: A | Discharge status: Home / Self Care | Payer: Medicare Other | Source: Ambulatory Visit | Attending: Internal Medicine | Admitting: Internal Medicine

## 2018-12-10 ENCOUNTER — Other Ambulatory Visit: Payer: Self-pay

## 2018-12-10 ENCOUNTER — Encounter (HOSPITAL_COMMUNITY): Payer: Self-pay | Admitting: Internal Medicine

## 2018-12-10 VITALS — BP 100/70 | HR 70 | Wt 138.0 lb

## 2018-12-10 DIAGNOSIS — I5042 Chronic combined systolic (congestive) and diastolic (congestive) heart failure: Secondary | ICD-10-CM

## 2018-12-10 DIAGNOSIS — H409 Unspecified glaucoma: Secondary | ICD-10-CM | POA: Diagnosis not present

## 2018-12-10 DIAGNOSIS — R7303 Prediabetes: Secondary | ICD-10-CM | POA: Diagnosis not present

## 2018-12-10 DIAGNOSIS — I739 Peripheral vascular disease, unspecified: Secondary | ICD-10-CM | POA: Diagnosis not present

## 2018-12-10 DIAGNOSIS — Z7902 Long term (current) use of antithrombotics/antiplatelets: Secondary | ICD-10-CM | POA: Insufficient documentation

## 2018-12-10 DIAGNOSIS — Z8673 Personal history of transient ischemic attack (TIA), and cerebral infarction without residual deficits: Secondary | ICD-10-CM | POA: Diagnosis not present

## 2018-12-10 DIAGNOSIS — E854 Organ-limited amyloidosis: Secondary | ICD-10-CM | POA: Insufficient documentation

## 2018-12-10 DIAGNOSIS — Z955 Presence of coronary angioplasty implant and graft: Secondary | ICD-10-CM | POA: Insufficient documentation

## 2018-12-10 DIAGNOSIS — Z7951 Long term (current) use of inhaled steroids: Secondary | ICD-10-CM | POA: Diagnosis not present

## 2018-12-10 DIAGNOSIS — I472 Ventricular tachycardia: Secondary | ICD-10-CM | POA: Insufficient documentation

## 2018-12-10 DIAGNOSIS — I110 Hypertensive heart disease with heart failure: Secondary | ICD-10-CM | POA: Insufficient documentation

## 2018-12-10 DIAGNOSIS — J90 Pleural effusion, not elsewhere classified: Secondary | ICD-10-CM

## 2018-12-10 DIAGNOSIS — Z888 Allergy status to other drugs, medicaments and biological substances status: Secondary | ICD-10-CM | POA: Diagnosis not present

## 2018-12-10 DIAGNOSIS — I5022 Chronic systolic (congestive) heart failure: Secondary | ICD-10-CM | POA: Diagnosis not present

## 2018-12-10 DIAGNOSIS — Z8249 Family history of ischemic heart disease and other diseases of the circulatory system: Secondary | ICD-10-CM | POA: Diagnosis not present

## 2018-12-10 DIAGNOSIS — N179 Acute kidney failure, unspecified: Secondary | ICD-10-CM | POA: Diagnosis not present

## 2018-12-10 DIAGNOSIS — I10 Essential (primary) hypertension: Secondary | ICD-10-CM

## 2018-12-10 DIAGNOSIS — I43 Cardiomyopathy in diseases classified elsewhere: Secondary | ICD-10-CM | POA: Diagnosis not present

## 2018-12-10 DIAGNOSIS — M791 Myalgia, unspecified site: Secondary | ICD-10-CM | POA: Diagnosis not present

## 2018-12-10 DIAGNOSIS — Z66 Do not resuscitate: Secondary | ICD-10-CM | POA: Insufficient documentation

## 2018-12-10 DIAGNOSIS — Z79899 Other long term (current) drug therapy: Secondary | ICD-10-CM | POA: Diagnosis not present

## 2018-12-10 DIAGNOSIS — I2510 Atherosclerotic heart disease of native coronary artery without angina pectoris: Secondary | ICD-10-CM | POA: Insufficient documentation

## 2018-12-10 DIAGNOSIS — I5023 Acute on chronic systolic (congestive) heart failure: Secondary | ICD-10-CM | POA: Diagnosis not present

## 2018-12-10 LAB — BASIC METABOLIC PANEL
Anion gap: 9 (ref 5–15)
BUN: 29 mg/dL — ABNORMAL HIGH (ref 8–23)
CO2: 32 mmol/L (ref 22–32)
Calcium: 10.4 mg/dL — ABNORMAL HIGH (ref 8.9–10.3)
Chloride: 96 mmol/L — ABNORMAL LOW (ref 98–111)
Creatinine, Ser: 1.43 mg/dL — ABNORMAL HIGH (ref 0.61–1.24)
GFR calc Af Amer: 56 mL/min — ABNORMAL LOW (ref >60)
GFR calc non Af Amer: 48 mL/min — ABNORMAL LOW (ref >60)
Glucose, Bld: 101 mg/dL — ABNORMAL HIGH (ref 70–99)
Potassium: 4.4 mmol/L (ref 3.5–5.1)
Sodium: 137 mmol/L (ref 135–145)

## 2018-12-10 MED ORDER — TORSEMIDE 20 MG PO TABS: 40.0000 mg | ORAL_TABLET | Freq: Two times a day (BID) | ORAL | 6 refills | Status: AC

## 2018-12-10 NOTE — Patient Instructions (Addendum)
STOP Lasix  START Torsemide 40mg  (2 tabs) daily  Chest Xray today: Office will call when xray has resulted  Labs today and in 1 week with [facility] We will only contact you if something comes back abnormal or we need to make some changes. Otherwise no news is good news!  We will order [employer] for [facility] to apply  You have been referred to [facility]. They will call you with an appointment date and time.   Your physician recommends that you schedule a follow-up appointment in: 1 month with Dr Haroldine Laws
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
## 2018-12-10 NOTE — Progress Notes (Signed)
Prescription for labs and unna boots faxed to [facility].  Referral form faxed to [facility]

## 2018-12-10 NOTE — Progress Notes (Signed)
Error

## 2018-12-12 ENCOUNTER — Other Ambulatory Visit: Payer: Self-pay | Admitting: *Deleted

## 2018-12-12 ENCOUNTER — Encounter: Payer: Self-pay | Admitting: *Deleted

## 2018-12-12 ENCOUNTER — Encounter: Payer: Medicare Other | Admitting: Cardiothoracic Surgery

## 2018-12-12 DIAGNOSIS — J90 Pleural effusion, not elsewhere classified: Secondary | ICD-10-CM

## 2018-12-17 NOTE — Pre-Procedure Instructions (Signed)
Stanley AzureRonald J [name]  12/17/2018      [id number] [address]- [place], [place] - [id number] AT [facility] [address] [place] [id number] Phone: [phone] Fax: [phone]  [facility] [place] - [id number] [place] [id number] Phone: [phone] Fax: [phone]    Your procedure is scheduled on December 21, 2018.  [facility] at 530 AM.  Call this number if you have problems the morning of surgery:  [phone]   Remember:  Do not eat or drink after midnight.    Take these medicines the morning of surgery with A SIP OF WATER  Midodrine Flonase nasal spray-if needed Eye drops-if needed Nitrostat-if needed for chest pain  Follow your surgeon's instructions on when to hold/resume Plavix.  If no instructions were given call the office to determine how they would like to you take Plavix  Beginning now, STOP taking any Aspirin (unless otherwise instructed by your surgeon), Aleve, Naproxen, Ibuprofen, Motrin, Advil, Goody's, BC's, all herbal medications, fish oil, and all vitamins    Do not wear jewelry  Do not wear lotions, powders, or colognes, or deodorant.  Men may shave face and neck.  Do not bring valuables to the hospital.  [facility] is not responsible for any belongings or valuables.  Contacts, dentures or bridgework may not be worn into surgery.  Leave your suitcase in the car.  After surgery it may be brought to your room.  For patients admitted to the hospital, discharge time will be determined by your treatment team.  Patients discharged the day of surgery will not be allowed to drive home.    [facility]- Preparing For Surgery  Before surgery, you can play an important role. Because skin is not sterile, your skin needs to be as free of germs as possible. You can reduce the number of germs on your skin by washing with CHG (chlorahexidine  gluconate) Soap before surgery.  CHG is an antiseptic cleaner which kills germs and bonds with the skin to continue killing germs even after washing.    Oral Hygiene is also important to reduce your risk of infection.  Remember - BRUSH YOUR TEETH THE MORNING OF SURGERY WITH YOUR REGULAR TOOTHPASTE  Please do not use if you have an allergy to CHG or antibacterial soaps. If your skin becomes reddened/irritated stop using the CHG.  Do not shave (including legs and underarms) for at least 48 hours prior to first CHG shower. It is OK to shave your face.  Please follow these instructions carefully.   1. Shower the NIGHT BEFORE SURGERY and the MORNING OF SURGERY with CHG.   2. If you chose to wash your hair, wash your hair first as usual with your normal shampoo.  3. After you shampoo, rinse your hair and body thoroughly to remove the shampoo.  4. Use CHG as you would any other liquid soap. You can apply CHG directly to the skin and wash gently with a scrungie or a clean washcloth.   5. Apply the CHG Soap to your body ONLY FROM THE NECK DOWN.  Do not use on open wounds or open sores. Avoid contact with your eyes, ears, mouth and genitals (private parts). Wash Face and genitals (private parts)  with your normal soap.  6. Wash thoroughly, paying special attention to the area where your surgery will be performed.  7. Thoroughly rinse your body  with warm water from the neck down.  8. DO NOT shower/wash with your normal soap after using and rinsing off the CHG Soap.  9. Pat yourself dry with a CLEAN TOWEL.  10. Wear CLEAN PAJAMAS to bed the night before surgery, wear comfortable clothes the morning of surgery  11. Place CLEAN SHEETS on your bed the night of your first shower and DO NOT SLEEP WITH PETS.  Day of Surgery:  Do not apply any deodorants/lotions.  Please wear clean clothes to the hospital/surgery center.   Remember to brush your teeth WITH YOUR REGULAR TOOTHPASTE.   Please read  over the following fact sheets that you were given.

## 2018-12-18 ENCOUNTER — Other Ambulatory Visit (HOSPITAL_COMMUNITY)
Admission: RE | Admit: 2018-12-18 | Discharge: 2018-12-18 | Disposition: A | Discharge status: Home / Self Care | Payer: Medicare Other | Source: Ambulatory Visit | Attending: Cardiothoracic Surgery | Admitting: Cardiothoracic Surgery

## 2018-12-18 ENCOUNTER — Other Ambulatory Visit: Payer: Self-pay

## 2018-12-18 ENCOUNTER — Ambulatory Visit (HOSPITAL_COMMUNITY)
Admission: RE | Admit: 2018-12-18 | Discharge: 2018-12-18 | Disposition: A | Discharge status: Home / Self Care | Payer: Medicare Other | Source: Ambulatory Visit | Attending: Cardiothoracic Surgery | Admitting: Cardiothoracic Surgery

## 2018-12-18 ENCOUNTER — Encounter (HOSPITAL_COMMUNITY)
Admission: RE | Admit: 2018-12-18 | Discharge: 2018-12-18 | Disposition: A | Discharge status: Home / Self Care | Payer: Medicare Other | Source: Ambulatory Visit | Attending: Cardiothoracic Surgery | Admitting: Cardiothoracic Surgery

## 2018-12-18 DIAGNOSIS — I739 Peripheral vascular disease, unspecified: Secondary | ICD-10-CM | POA: Diagnosis not present

## 2018-12-18 DIAGNOSIS — H409 Unspecified glaucoma: Secondary | ICD-10-CM | POA: Insufficient documentation

## 2018-12-18 DIAGNOSIS — Z01818 Encounter for other preprocedural examination: Secondary | ICD-10-CM | POA: Diagnosis not present

## 2018-12-18 DIAGNOSIS — J90 Pleural effusion, not elsewhere classified: Secondary | ICD-10-CM | POA: Insufficient documentation

## 2018-12-18 DIAGNOSIS — Z951 Presence of aortocoronary bypass graft: Secondary | ICD-10-CM | POA: Diagnosis not present

## 2018-12-18 DIAGNOSIS — E785 Hyperlipidemia, unspecified: Secondary | ICD-10-CM | POA: Insufficient documentation

## 2018-12-18 DIAGNOSIS — I5042 Chronic combined systolic (congestive) and diastolic (congestive) heart failure: Secondary | ICD-10-CM | POA: Diagnosis not present

## 2018-12-18 DIAGNOSIS — I130 Hypertensive heart and chronic kidney disease with heart failure and stage 1 through stage 4 chronic kidney disease, or unspecified chronic kidney disease: Secondary | ICD-10-CM | POA: Insufficient documentation

## 2018-12-18 DIAGNOSIS — Z79899 Other long term (current) drug therapy: Secondary | ICD-10-CM | POA: Diagnosis not present

## 2018-12-18 DIAGNOSIS — N189 Chronic kidney disease, unspecified: Secondary | ICD-10-CM | POA: Diagnosis not present

## 2018-12-18 DIAGNOSIS — Z955 Presence of coronary angioplasty implant and graft: Secondary | ICD-10-CM | POA: Diagnosis not present

## 2018-12-18 DIAGNOSIS — E213 Hyperparathyroidism, unspecified: Secondary | ICD-10-CM | POA: Diagnosis not present

## 2018-12-18 DIAGNOSIS — Z7902 Long term (current) use of antithrombotics/antiplatelets: Secondary | ICD-10-CM | POA: Diagnosis not present

## 2018-12-18 DIAGNOSIS — Z1159 Encounter for screening for other viral diseases: Secondary | ICD-10-CM | POA: Insufficient documentation

## 2018-12-18 DIAGNOSIS — R7303 Prediabetes: Secondary | ICD-10-CM | POA: Diagnosis not present

## 2018-12-18 DIAGNOSIS — I2510 Atherosclerotic heart disease of native coronary artery without angina pectoris: Secondary | ICD-10-CM | POA: Diagnosis not present

## 2018-12-18 HISTORY — DX: Chronic kidney disease, unspecified: N18.9

## 2018-12-18 HISTORY — DX: Chronic systolic (congestive) heart failure: I50.22

## 2018-12-18 LAB — COMPREHENSIVE METABOLIC PANEL
ALT: 43 U/L (ref 0–44)
AST: 68 U/L — ABNORMAL HIGH (ref 15–41)
Albumin: 2.6 g/dL — ABNORMAL LOW (ref 3.5–5.0)
Alkaline Phosphatase: 232 U/L — ABNORMAL HIGH (ref 38–126)
Anion gap: 11 (ref 5–15)
BUN: 26 mg/dL — ABNORMAL HIGH (ref 8–23)
CO2: 35 mmol/L — ABNORMAL HIGH (ref 22–32)
Calcium: 10.2 mg/dL (ref 8.9–10.3)
Chloride: 94 mmol/L — ABNORMAL LOW (ref 98–111)
Creatinine, Ser: 1.62 mg/dL — ABNORMAL HIGH (ref 0.61–1.24)
GFR calc Af Amer: 48 mL/min — ABNORMAL LOW (ref >60)
GFR calc non Af Amer: 41 mL/min — ABNORMAL LOW (ref >60)
Glucose, Bld: 111 mg/dL — ABNORMAL HIGH (ref 70–99)
Potassium: 3.5 mmol/L (ref 3.5–5.1)
Sodium: 140 mmol/L (ref 135–145)
Total Bilirubin: 3.5 mg/dL — ABNORMAL HIGH (ref 0.3–1.2)
Total Protein: 7.4 g/dL (ref 6.5–8.1)

## 2018-12-18 LAB — CBC
HCT: 44.9 % (ref 39.0–52.0)
Hemoglobin: 14.5 g/dL (ref 13.0–17.0)
MCH: 32.6 pg (ref 26.0–34.0)
MCHC: 32.3 g/dL (ref 30.0–36.0)
MCV: 100.9 fL — ABNORMAL HIGH (ref 80.0–100.0)
Platelets: 291 10*3/uL (ref 150–400)
RBC: 4.45 MIL/uL (ref 4.22–5.81)
RDW: 13.5 % (ref 11.5–15.5)
WBC: 6 10*3/uL (ref 4.0–10.5)
nRBC: 0 % (ref 0.0–0.2)

## 2018-12-18 LAB — PROTIME-INR
INR: 1.1 (ref 0.8–1.2)
Prothrombin Time: 14.2 seconds (ref 11.4–15.2)

## 2018-12-18 LAB — SURGICAL PCR SCREEN
MRSA, PCR: NEGATIVE
Staphylococcus aureus: POSITIVE — AB

## 2018-12-18 LAB — SARS CORONAVIRUS 2 (TAT 6-24 HRS): SARS Coronavirus 2: NEGATIVE

## 2018-12-18 LAB — APTT: aPTT: 30 seconds (ref 24–36)

## 2018-12-18 NOTE — Progress Notes (Addendum)
PCP - Melinda Crutch, MD Cardiologist - Fransico Him, MD  Chest x-ray -  12/18/2018 at PAT EKG - 10/2018 in EPIC  Stress Test - 01/22/18 under Media tab ECHO - 09/26/2018 in EPIC  Cardiac Cath - 2002  Sleep Study - NO CPAP - n/a  Fasting Blood Sugar - n/a Checks Blood Sugar _____ times a day-n/a  Blood Thinner Instructions:  Plavix continue per Dr. Prescott Gum Aspirin Instructions: n/a  Anesthesia review: Yes, EKG  Patient denies shortness of breath, fever, cough and chest pain at PAT appointment  Patient verbalized understanding of instructions that were given to them at the PAT appointment. Patient was also instructed that they will need to review over the PAT instructions again at home before surgery.

## 2018-12-19 ENCOUNTER — Encounter: Payer: Self-pay | Admitting: Cardiothoracic Surgery

## 2018-12-19 ENCOUNTER — Encounter (HOSPITAL_COMMUNITY): Payer: Self-pay

## 2018-12-19 ENCOUNTER — Encounter: Payer: Medicare Other | Admitting: Cardiothoracic Surgery

## 2018-12-19 ENCOUNTER — Institutional Professional Consult (permissible substitution) (INDEPENDENT_AMBULATORY_CARE_PROVIDER_SITE_OTHER): Payer: Medicare Other | Admitting: Cardiothoracic Surgery

## 2018-12-19 VITALS — BP 89/55 | HR 54 | Temp 96.6°F | Resp 16 | Ht 66.0 in | Wt 138.0 lb

## 2018-12-19 DIAGNOSIS — I5043 Acute on chronic combined systolic (congestive) and diastolic (congestive) heart failure: Secondary | ICD-10-CM

## 2018-12-19 DIAGNOSIS — J90 Pleural effusion, not elsewhere classified: Secondary | ICD-10-CM | POA: Diagnosis not present
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
## 2018-12-19 NOTE — Anesthesia Preprocedure Evaluation (Addendum)
Anesthesia Evaluation  Patient identified by MRN, date of birth, ID band Patient awake    Reviewed: Allergy & Precautions, NPO status , Patient's Chart, lab work & pertinent test results  Airway Mallampati: II  TM Distance: >3 FB Neck ROM: Full    Dental  (+) Teeth Intact, Dental Advisory Given   Pulmonary     + decreased breath sounds      Cardiovascular hypertension,  Rhythm:Regular Rate:Normal     Neuro/Psych    GI/Hepatic   Endo/Other    Renal/GU      Musculoskeletal   Abdominal   Peds  Hematology   Anesthesia Other Findings   Reproductive/Obstetrics                            Anesthesia Physical Anesthesia Plan  ASA: III  Anesthesia Plan:    Post-op Pain Management:    Induction: Intravenous  PONV Risk Score and Plan: Ondansetron  Airway Management Planned: Natural Airway and Simple Face Mask  Additional Equipment:   Intra-op Plan:   Post-operative Plan:   Informed Consent: I have reviewed the patients History and Physical, chart, labs and discussed the procedure including the risks, benefits and alternatives for the proposed anesthesia with the patient or authorized representative who has indicated his/her understanding and acceptance.     Dental advisory given  Plan Discussed with: CRNA and Anesthesiologist  Anesthesia Plan Comments: (See PAT note written 12/19/2018 by Myra Gianotti, PA-C. 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 HF and hospice following. 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 On Plavix for CVA-type symptoms 09/2018. On midodrine for hypotension.  )       Anesthesia Quick Evaluation
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
## 2018-12-21 ENCOUNTER — Ambulatory Visit (HOSPITAL_COMMUNITY): Payer: Medicare Other | Admitting: Certified Registered"

## 2018-12-21 ENCOUNTER — Encounter (HOSPITAL_COMMUNITY)
Admission: RE | Disposition: A | Discharge status: Home / Self Care | Payer: Self-pay | Source: Home / Self Care | Attending: Cardiothoracic Surgery

## 2018-12-21 ENCOUNTER — Ambulatory Visit (HOSPITAL_COMMUNITY): Payer: Medicare Other | Admitting: Vascular Surgery

## 2018-12-21 ENCOUNTER — Telehealth: Payer: Self-pay

## 2018-12-21 ENCOUNTER — Ambulatory Visit (HOSPITAL_COMMUNITY): Payer: Medicare Other

## 2018-12-21 ENCOUNTER — Ambulatory Visit (HOSPITAL_COMMUNITY)
Admission: RE | Admit: 2018-12-21 | Discharge: 2018-12-21 | Disposition: A | Discharge status: Home / Self Care | Payer: Medicare Other | Attending: Cardiothoracic Surgery | Admitting: Cardiothoracic Surgery

## 2018-12-21 ENCOUNTER — Encounter (HOSPITAL_COMMUNITY): Payer: Self-pay | Admitting: *Deleted

## 2018-12-21 DIAGNOSIS — I2510 Atherosclerotic heart disease of native coronary artery without angina pectoris: Secondary | ICD-10-CM | POA: Diagnosis not present

## 2018-12-21 DIAGNOSIS — E859 Amyloidosis, unspecified: Secondary | ICD-10-CM | POA: Diagnosis not present

## 2018-12-21 DIAGNOSIS — Z951 Presence of aortocoronary bypass graft: Secondary | ICD-10-CM | POA: Insufficient documentation

## 2018-12-21 DIAGNOSIS — J90 Pleural effusion, not elsewhere classified: Secondary | ICD-10-CM | POA: Insufficient documentation

## 2018-12-21 DIAGNOSIS — Z79899 Other long term (current) drug therapy: Secondary | ICD-10-CM | POA: Insufficient documentation

## 2018-12-21 DIAGNOSIS — I739 Peripheral vascular disease, unspecified: Secondary | ICD-10-CM | POA: Insufficient documentation

## 2018-12-21 DIAGNOSIS — Z7902 Long term (current) use of antithrombotics/antiplatelets: Secondary | ICD-10-CM | POA: Diagnosis not present

## 2018-12-21 DIAGNOSIS — N189 Chronic kidney disease, unspecified: Secondary | ICD-10-CM | POA: Diagnosis not present

## 2018-12-21 DIAGNOSIS — I5032 Chronic diastolic (congestive) heart failure: Secondary | ICD-10-CM | POA: Insufficient documentation

## 2018-12-21 DIAGNOSIS — I130 Hypertensive heart and chronic kidney disease with heart failure and stage 1 through stage 4 chronic kidney disease, or unspecified chronic kidney disease: Secondary | ICD-10-CM | POA: Diagnosis not present

## 2018-12-21 DIAGNOSIS — I429 Cardiomyopathy, unspecified: Secondary | ICD-10-CM | POA: Insufficient documentation

## 2018-12-21 DIAGNOSIS — Z419 Encounter for procedure for purposes other than remedying health state, unspecified: Secondary | ICD-10-CM

## 2018-12-21 DIAGNOSIS — Z9689 Presence of other specified functional implants: Secondary | ICD-10-CM

## 2018-12-21 HISTORY — PX: CHEST TUBE INSERTION: SHX231

## 2018-12-21 SURGERY — INSERTION, PLEURAL DRAINAGE CATHETER
Anesthesia: Monitor Anesthesia Care | Site: Chest | Laterality: Bilateral

## 2018-12-21 MED ORDER — CEFAZOLIN SODIUM-DEXTROSE 2-4 GM/100ML-% IV SOLN
2.0000 g | INTRAVENOUS | Status: AC
  Administered 2018-12-21: 2 g via INTRAVENOUS

## 2018-12-21 MED ORDER — PROPOFOL 500 MG/50ML IV EMUL
INTRAVENOUS | Status: DC | PRN
  Administered 2018-12-21: 20 ug/kg/min via INTRAVENOUS

## 2018-12-21 MED ORDER — 0.9 % SODIUM CHLORIDE (POUR BTL) OPTIME
TOPICAL | Status: DC | PRN
  Administered 2018-12-21: 1000 mL

## 2018-12-21 MED ORDER — OXYCODONE HCL 5 MG PO TABS: 5.0000 mg | ORAL_TABLET | ORAL | Status: DC | PRN

## 2018-12-21 MED ORDER — TRAMADOL HCL 50 MG PO TABS: 50.0000 mg | ORAL_TABLET | Freq: Four times a day (QID) | ORAL | 0 refills | Status: AC | PRN

## 2018-12-21 MED ORDER — PROPOFOL 10 MG/ML IV BOLUS
INTRAVENOUS | Status: AC
  Filled 2018-12-21: qty 20

## 2018-12-21 MED ORDER — FENTANYL CITRATE (PF) 100 MCG/2ML IJ SOLN: 25.0000 ug | INTRAMUSCULAR | Status: DC | PRN

## 2018-12-21 MED ORDER — OXYCODONE HCL 5 MG/5ML PO SOLN: 5.0000 mg | Freq: Once | ORAL | Status: DC | PRN

## 2018-12-21 MED ORDER — LIDOCAINE HCL (PF) 1 % IJ SOLN
INTRAMUSCULAR | Status: AC
  Filled 2018-12-21: qty 30

## 2018-12-21 MED ORDER — LACTATED RINGERS IV SOLN
INTRAVENOUS | Status: DC | PRN
  Administered 2018-12-21: 07:00:00 via INTRAVENOUS

## 2018-12-21 MED ORDER — CEFAZOLIN SODIUM-DEXTROSE 2-4 GM/100ML-% IV SOLN
INTRAVENOUS | Status: AC
  Filled 2018-12-21: qty 100

## 2018-12-21 MED ORDER — ACETAMINOPHEN 500 MG PO TABS: 1000.0000 mg | ORAL_TABLET | Freq: Four times a day (QID) | ORAL | Status: DC

## 2018-12-21 MED ORDER — SODIUM CHLORIDE 0.9 % IV SOLN
INTRAVENOUS | Status: DC | PRN
  Administered 2018-12-21: 25 ug/min via INTRAVENOUS

## 2018-12-21 MED ORDER — FENTANYL CITRATE (PF) 250 MCG/5ML IJ SOLN
INTRAMUSCULAR | Status: AC
  Filled 2018-12-21: qty 5

## 2018-12-21 MED ORDER — LIDOCAINE HCL 1 % IJ SOLN
INTRAMUSCULAR | Status: DC | PRN
  Administered 2018-12-21: 22 mL

## 2018-12-21 MED ORDER — SODIUM CHLORIDE 0.9% FLUSH: 3.0000 mL | INTRAVENOUS | Status: DC | PRN

## 2018-12-21 MED ORDER — SODIUM CHLORIDE 0.9 % IV SOLN: 250.0000 mL | INTRAVENOUS | Status: DC | PRN

## 2018-12-21 MED ORDER — SODIUM CHLORIDE 0.9% FLUSH: 3.0000 mL | Freq: Two times a day (BID) | INTRAVENOUS | Status: DC

## 2018-12-21 MED ORDER — OXYCODONE HCL 5 MG PO TABS: 5.0000 mg | ORAL_TABLET | Freq: Once | ORAL | Status: DC | PRN

## 2018-12-21 MED ORDER — ACETAMINOPHEN 650 MG RE SUPP: 650.0000 mg | RECTAL | Status: DC | PRN

## 2018-12-21 MED ORDER — ACETAMINOPHEN 325 MG PO TABS: 650.0000 mg | ORAL_TABLET | ORAL | Status: DC | PRN

## 2018-12-21 SURGICAL SUPPLY — 32 items
ADH SKN CLS APL DERMABOND .7 (GAUZE/BANDAGES/DRESSINGS) ×1
CANISTER SUCT 3000ML PPV (MISCELLANEOUS) ×3 IMPLANT
COVER SURGICAL LIGHT HANDLE (MISCELLANEOUS) ×3 IMPLANT
COVER WAND RF STERILE (DRAPES) ×3 IMPLANT
DERMABOND ADVANCED (GAUZE/BANDAGES/DRESSINGS) ×2
DERMABOND ADVANCED .7 DNX12 (GAUZE/BANDAGES/DRESSINGS) ×1 IMPLANT
DRAPE C-ARM 42X72 X-RAY (DRAPES) ×3 IMPLANT
DRAPE LAPAROSCOPIC ABDOMINAL (DRAPES) ×3 IMPLANT
ELECT REM PT RETURN 9FT ADLT (ELECTROSURGICAL) ×3
ELECTRODE REM PT RTRN 9FT ADLT (ELECTROSURGICAL) ×1 IMPLANT
GLOVE BIO SURGEON STRL SZ7.5 (GLOVE) ×6 IMPLANT
GLOVE INDICATOR 7.5 STRL GRN (GLOVE) ×2 IMPLANT
GOWN STRL REUS W/ TWL LRG LVL3 (GOWN DISPOSABLE) ×2 IMPLANT
GOWN STRL REUS W/TWL LRG LVL3 (GOWN DISPOSABLE) ×6
KIT BASIN OR (CUSTOM PROCEDURE TRAY) ×3 IMPLANT
KIT PLEURX DRAIN CATH 1000ML (MISCELLANEOUS) ×5 IMPLANT
KIT PLEURX DRAIN CATH 15.5FR (DRAIN) ×5 IMPLANT
KIT TURNOVER KIT B (KITS) ×3 IMPLANT
NDL HYPO 25GX1X1/2 BEV (NEEDLE) ×1 IMPLANT
NEEDLE HYPO 25GX1X1/2 BEV (NEEDLE) ×3 IMPLANT
NS IRRIG 1000ML POUR BTL (IV SOLUTION) ×3 IMPLANT
PACK GENERAL/GYN (CUSTOM PROCEDURE TRAY) ×3 IMPLANT
PAD ARMBOARD 7.5X6 YLW CONV (MISCELLANEOUS) ×6 IMPLANT
SET DRAINAGE LINE (MISCELLANEOUS) IMPLANT
SUT ETHILON 3 0 PS 1 (SUTURE) ×5 IMPLANT
SUT SILK 2 0 SH (SUTURE) ×5 IMPLANT
SUT VIC AB 3-0 SH 8-18 (SUTURE) ×3 IMPLANT
SYR CONTROL 10ML LL (SYRINGE) ×3 IMPLANT
TOWEL GREEN STERILE (TOWEL DISPOSABLE) ×3 IMPLANT
TOWEL GREEN STERILE FF (TOWEL DISPOSABLE) ×3 IMPLANT
VALVE REPLACEMENT CAP (MISCELLANEOUS) IMPLANT
WATER STERILE IRR 1000ML POUR (IV SOLUTION) ×3 IMPLANT

## 2018-12-21 NOTE — Progress Notes (Signed)
CT surgery  CXR postop shows cleared effusions Patient in controlled atrial flutter- has past hx atrial tachycardia on po amiodarone One dose of iv aniodarone ordered and patients cardiologist notified

## 2018-12-21 NOTE — Progress Notes (Signed)
Pre Procedure note for inpatients:   Stanley [name] has been scheduled for Procedure(s): INSERTION PLEURAL DRAINAGE CATHETER (Bilateral) today. The various methods of treatment have been discussed with the patient. After consideration of the risks, benefits and treatment options the patient has consented to the planned procedure.   The patient has been seen and labs reviewed. There are no changes in the patient's condition to prevent proceeding with the planned procedure today.  Recent labs:  Lab Results  Component Value Date   WBC 6.0 12/18/2018   HGB 14.5 12/18/2018   HCT 44.9 12/18/2018   PLT 291 12/18/2018   GLUCOSE 111 (H) 12/18/2018   CHOL 156 09/27/2018   TRIG 62 09/27/2018   HDL 28 (L) 09/27/2018   LDLDIRECT 182 (H) 09/05/2016   LDLCALC 116 (H) 09/27/2018   ALT 43 12/18/2018   AST 68 (H) 12/18/2018   NA 140 12/18/2018   K 3.5 12/18/2018   CL 94 (L) 12/18/2018   CREATININE 1.62 (H) 12/18/2018   BUN 26 (H) 12/18/2018   CO2 35 (H) 12/18/2018   TSH 1.678 11/21/2018   INR 1.1 12/18/2018   HGBA1C 5.3 09/27/2018    Len Childs, MD 12/21/2018 7:26 AM

## 2018-12-21 NOTE — Telephone Encounter (Signed)
Patient's son, Thurmond Butts notified of prescription Tramadol called into [facility] pharmacy on [address] in [place] per Dr. Prescott Gum.

## 2018-12-21 NOTE — Anesthesia Postprocedure Evaluation (Signed)
Anesthesia Post Note  Patient: Stanley [name]  Procedure(s) Performed: INSERTION PLEURAL DRAINAGE CATHETER (Bilateral Chest)     Patient location during evaluation: PACU Anesthesia Type: MAC Level of consciousness: awake and alert and oriented Pain management: pain level controlled Respiratory status: spontaneous breathing, nonlabored ventilation, respiratory function stable and patient connected to nasal cannula oxygen Cardiovascular status: stable and blood pressure returned to baseline Postop Assessment: no apparent nausea or vomiting Anesthetic complications: no    Last Vitals:  Vitals:   12/21/18 1030 12/21/18 1040  BP:  90/61  Pulse:  (!) 56  Resp: 20 18  Temp: (!) 36.1 C   SpO2:  99%    Last Pain:  Vitals:   12/21/18 1040  TempSrc:   PainSc: 0-No pain                 Carles Florea COKER

## 2018-12-21 NOTE — Op Note (Signed)
NAME: Stanley [name], Stanley [name]. MEDICAL RECORD [id number] ACCOUNT [id number] DATE OF BIRTH:1946/01/13 FAC[facility] LOCATION: [facility] PHYSICIAN:PETER VAN TRIGT III, MD  OPERATIVE REPORT  DATE OF PROCEDURE:  12/21/2018  OPERATION:  Placement of bilateral PleurX catheters for recurrent pleural effusion.  SURGEON:  Tharon Aquas Trigt III, MD  ANESTHESIA:  IV monitored conscious sedation and local 1% lidocaine.  PREOPERATIVE DIAGNOSIS:  Cardiomyopathy from cardiac amyloid disease with diastolic heart failure and recurrent bilateral pleural effusions, status post prior thoracentesis procedures.  POSTOPERATIVE DIAGNOSIS:  Cardiomyopathy from cardiac amyloid disease with diastolic heart failure and recurrent bilateral pleural effusions, status post prior thoracentesis procedures.  DESCRIPTION OF PROCEDURE:  The patient was brought from preop holding where informed consent was documented and the patient was marked on the proper site of surgery (both sides).  All final questions were addressed.  The most recent image of his chest  x-ray was personally reviewed.  The patient was brought to the operating room and placed supine on the operating table.  He was monitored by anesthesia and given IV sedation.  First, the right chest was prepped and draped as a sterile field and a proper time-out was performed.  Local  1% lidocaine was infiltrated in the anterior axillary line at the 5th interspace.  A small incision was made, and using the Seldinger technique, a guidewire passed into the right pleural space.  This was confirmed by C-arm fluoroscopy.  Over the  guidewire, a PleurX catheter was placed through a tear-away sheath, and the catheter tunneled out a separate exit site approximately 5 cm inferiorly.  The catheter drained 1 L of amber fluid.  It was capped and the sterile dressing was applied after the  incisions were closed in a typical fashion.  Next, the left chest was exposed and prepped and draped  as a sterile field.  A proper time-out was performed.  Lidocaine 1% was infiltrated in the anterior axillary line at the 5th interspace and then at the exit site at the costal margin for 4-5 cm  inferiorly.  Two incisions were made in these areas.  Through the upper incision, a guidewire was passed under the Seldinger technique into the left pleural space.  Over the guidewire, a dilator then dilator sheath was inserted.  Through the dilator  sheath, a PleurX catheter was placed after it was tunneled from the lower incision to the upper incision.  It drained 1.0 L of amber fluid.  Both incisions were closed in a standard fashion and sterile dressings applied.  The patient was returned to  recovery room for further observation.  LN/NUANCE  D:12/21/2018 T:12/21/2018 JOB:[id number]

## 2018-12-21 NOTE — Transfer of Care (Signed)
Immediate Anesthesia Transfer of Care Note  Patient: Stanley [name]  Procedure(s) Performed: INSERTION PLEURAL DRAINAGE CATHETER (Bilateral Chest)  Patient Location: PACU  Anesthesia Type:MAC  Level of Consciousness: awake, alert , oriented and drowsy  Airway & Oxygen Therapy: Patient Spontanous Breathing and Patient connected to face mask oxygen  Post-op Assessment: Report given to RN, Post -op Vital signs reviewed and stable and Patient moving all extremities X 4  Post vital signs: Reviewed and stable  Last Vitals:  Vitals Value Taken Time  BP 97/62 12/21/18 0857  Temp    Pulse    Resp 28 12/21/18 0857  SpO2    Vitals shown include unvalidated device data.  Last Pain:  Vitals:   12/21/18 0620  TempSrc:   PainSc: 3          Complications: No apparent anesthesia complications

## 2018-12-21 NOTE — Discharge Instructions (Signed)
Resume plavix 6-28 Take tylenol for pain  Office will call for appt in 1-2 weeks May shower in 48 hrs

## 2018-12-21 NOTE — Brief Op Note (Signed)
12/21/2018  8:58 AM  PATIENT:  Maye Hides  73 y.o. male  PRE-OPERATIVE DIAGNOSIS:  BILATERAL PLEUREX CATHETER  POST-OPERATIVE DIAGNOSIS:  Bilateral Pleural Effusions.  Drainage of Bilateral Effusions- 1 Liter each side PROCEDURE:  Procedure(s): INSERTION PLEURAL DRAINAGE CATHETER (Bilateral)  SURGEON:  Surgeon(s) and Role:    Ivin Poot, MD - Primary  PHYSICIAN ASSISTANT: NA  ASSISTANTS: none   ANESTHESIA:   local and IV sedation  EBL:  5 ml  BLOOD ADMINISTERED:none  DRAINS: bilateral pleurx catheters   LOCAL MEDICATIONS USED:  LIDOCAINE  and Amount: 15 ml  SPECIMEN:  No Specimen  DISPOSITION OF SPECIMEN:  N/A  COUNTS:  YES  TOURNIQUET:  * No tourniquets in log *  DICTATION: .Dragon Dictation  PLAN OF CARE: Discharge to home after PACU  PATIENT DISPOSITION:  PACU - hemodynamically stable.   Delay start of Pharmacological VTE agent (>24hrs) due to surgical blood loss or risk of bleeding: no

## 2018-12-21 NOTE — TOC Transition Note (Signed)
Transition of Care [facility]) - CM/SW Discharge Note   Patient Details  Name: Stanley [name] MRN: [id number] Date of Birth: [birth year]  [facility] CM/SW Contact:  Fuller Mandril, RN Phone Number: 12/21/2018, 11:01 AM   Clinical Narrative:     [facility] with [facility].  Resumption of care requested. Tiffany of [facility]  notified.  DME needs identified at this time include plurex drainage kits, of which pt will be discharged with a case.   Final next level of care: [place] Barriers to Discharge: Barriers Resolved   Patient Goals and CMS Choice Patient states their goals for this hospitalization and ongoing recovery are:: to breath better CMS Medicare.gov Compare Post Acute Care list provided to:: Patient Choice offered to / list presented to : Patient  Discharge Placement                       Discharge Plan and Services In-house Referral: NA Discharge Planning Services: CM Consult Post Acute Care Choice: Durable Medical Equipment, Home Health          DME Arranged: Chest tube pluerex DME Agency: [facility]) Date DME Agency Contacted: 12/21/18 Time DME Agency Contacted: [phone] Representative spoke with at DME Agency: [place]: RN [place] Agency: [facility] (now [facility]) Date [place]: 12/21/18 Time [place]: 1030 Representative spoke with at [place]: [place] Interventions     Readmission Risk Interventions No flowsheet data found.

## 2018-12-21 NOTE — TOC Initial Note (Signed)
Transition of Care [facility]) - Initial/Assessment Note    Patient Details  Name: Stanley [name] MRN: [id number] Date of Birth: 1946/05/17  [facility] of [facility]) CM/SW Contact:    Fuller Mandril, RN Phone Number: 12/21/2018, 10:58 AM  Clinical Narrative:                 [facility] consulted to assist with home health and DME services.  Expected Discharge Plan: [place] Barriers to Discharge: Equipment Delay   Patient Goals and CMS Choice Patient states their goals for this hospitalization and ongoing recovery are:: to breath better CMS Medicare.gov Compare Post Acute Care list provided to:: Patient Choice offered to / list presented to : Patient  Expected Discharge Plan and Services Expected Discharge Plan: [place] In-house Referral: NA Discharge Planning Services: CM Consult Post Acute Care Choice: Durable Medical Equipment, Home Health Living arrangements for the past 2 months: Single Family Home Expected Discharge Date: 12/21/18               DME Arranged: Chest tube pluerex DME Agency: [facility] (now [facility] Date DME Agency Contacted: 12/21/18   Representative spoke with at DME Agency: [place]: RN [place] Agency: [facility] (now [facility]) Date [place]: 12/21/18   Representative spoke with at [place]: [place]  Prior Living Arrangements/Services Living arrangements for the past 2 months: [place] with:: Self   Do you feel safe going back to the place where you live?: Yes          Current home services: Home RN    Activities of Daily Living      Permission Sought/Granted Permission sought to share information with : [facility] liaison) Permission granted to share information with : Yes, Verbal Permission Granted              Emotional Assessment   Attitude/Demeanor/Rapport: Lethargic(post surgery) Affect (typically observed): Appropriate Orientation: :  Oriented to Self, Oriented to Place, Oriented to  Time, Oriented to Situation Alcohol / Substance Use: Not Applicable Psych Involvement: No (comment)  Admission diagnosis:  BILATERAL [place] Patient Active Problem List   Diagnosis Date Noted  . Protein-calorie malnutrition, severe 11/30/2018  . Pressure injury of skin 11/23/2018  . Acute CHF (congestive heart failure) ([place]) 11/21/2018  . Rotator cuff disorder, right 09/28/2018  . Stroke-like episode ([place]) s/p tPA, unable to get MRI to confirm/refute 09/26/2018  . Lactic acidosis 09/08/2018  . Anasarca 09/07/2018  . Elevated LFTs 09/07/2018  . CHF exacerbation ([place]) 09/07/2018  . Acute on chronic combined systolic and diastolic CHF (congestive heart failure) ([place]) 09/07/2018  . Prolonged QT interval 09/07/2018  . Cellulitis 09/07/2018  . Malnutrition of moderate degree 07/04/2018  . Ventral hernia with bowel obstruction 07/03/2018  . Incarcerated epigastric hernia 07/02/2018  . SBO (small bowel obstruction) ([place]) 07/02/2018  . CKD (chronic kidney disease) stage 3, GFR 30-59 ml/min (HCC) 07/02/2018  . Essential hypertension   . Coronary artery disease due to lipid rich plaque   . Peripheral arterial disease ([place]) 10/26/2017  . Morbid obesity due to excess calories ([place]) 01/25/2017  . Upper airway cough syndrome 01/24/2017  . CAD (coronary artery disease), native coronary artery 03/21/2016  . Chronic combined systolic and diastolic CHF (congestive heart failure) ([place])   . Dyspnea on exertion   . Thrombocytopenia ([place])   . Elevated troponin 12/05/2015  . Hyperlipidemia   . Essential hypertension,  benign 10/16/2013  . Encounter for long-term (current) use of other medications 10/16/2013  . Nevus, non-neoplastic 07/25/2012  . Varicose veins of lower extremities with other complications 06/25/2012  . Leg pain, bilateral 06/25/2012  . Swelling of limb 03/12/2012  . Cholelithiasis 02/10/2011   PCP:  Daisy Florooss, Charles Alan,  MD Pharmacy:   [phone] [place] Otto[place], [place] - [phone] [facility] [address] [facility] [address] [place] [address] Phone: [phone] Fax: [phone]  [facility] [place] - [id number] [facility] [address] [place] [address] [place] [address] Phone: [phone] Fax: [phone]     Social Determinants of Health (SDOH) Interventions    Readmission Risk Interventions No flowsheet data found.

## 2018-12-22 ENCOUNTER — Encounter (HOSPITAL_COMMUNITY): Payer: Self-pay | Admitting: Cardiothoracic Surgery

## 2018-12-24 ENCOUNTER — Other Ambulatory Visit: Payer: Self-pay

## 2018-12-24 ENCOUNTER — Encounter (HOSPITAL_COMMUNITY): Payer: Self-pay | Admitting: Emergency Medicine

## 2018-12-24 ENCOUNTER — Emergency Department (HOSPITAL_COMMUNITY)
Admission: EM | Admit: 2018-12-24 | Discharge: 2018-12-24 | Disposition: A | Discharge status: Home / Self Care | Payer: Medicare Other | Attending: Emergency Medicine | Admitting: Emergency Medicine

## 2018-12-24 ENCOUNTER — Emergency Department (HOSPITAL_COMMUNITY): Payer: Medicare Other

## 2018-12-24 DIAGNOSIS — Y929 Unspecified place or not applicable: Secondary | ICD-10-CM | POA: Insufficient documentation

## 2018-12-24 DIAGNOSIS — Z79899 Other long term (current) drug therapy: Secondary | ICD-10-CM | POA: Diagnosis not present

## 2018-12-24 DIAGNOSIS — X58XXXA Exposure to other specified factors, initial encounter: Secondary | ICD-10-CM | POA: Diagnosis not present

## 2018-12-24 DIAGNOSIS — I5042 Chronic combined systolic (congestive) and diastolic (congestive) heart failure: Secondary | ICD-10-CM | POA: Insufficient documentation

## 2018-12-24 DIAGNOSIS — N183 Chronic kidney disease, stage 3 (moderate): Secondary | ICD-10-CM | POA: Diagnosis not present

## 2018-12-24 DIAGNOSIS — I130 Hypertensive heart and chronic kidney disease with heart failure and stage 1 through stage 4 chronic kidney disease, or unspecified chronic kidney disease: Secondary | ICD-10-CM | POA: Diagnosis not present

## 2018-12-24 DIAGNOSIS — S91302A Unspecified open wound, left foot, initial encounter: Secondary | ICD-10-CM | POA: Diagnosis not present

## 2018-12-24 DIAGNOSIS — Y999 Unspecified external cause status: Secondary | ICD-10-CM | POA: Diagnosis not present

## 2018-12-24 DIAGNOSIS — Z7902 Long term (current) use of antithrombotics/antiplatelets: Secondary | ICD-10-CM | POA: Diagnosis not present

## 2018-12-24 DIAGNOSIS — N390 Urinary tract infection, site not specified: Secondary | ICD-10-CM | POA: Insufficient documentation

## 2018-12-24 DIAGNOSIS — Z960 Presence of urogenital implants: Secondary | ICD-10-CM | POA: Diagnosis not present

## 2018-12-24 DIAGNOSIS — Y939 Activity, unspecified: Secondary | ICD-10-CM | POA: Diagnosis not present

## 2018-12-24 DIAGNOSIS — I2510 Atherosclerotic heart disease of native coronary artery without angina pectoris: Secondary | ICD-10-CM | POA: Insufficient documentation

## 2018-12-24 LAB — CBC WITH DIFFERENTIAL/PLATELET
Abs Immature Granulocytes: 0.03 10*3/uL (ref 0.00–0.07)
Basophils Absolute: 0 10*3/uL (ref 0.0–0.1)
Basophils Relative: 0 %
Eosinophils Absolute: 0.1 10*3/uL (ref 0.0–0.5)
Eosinophils Relative: 1 %
HCT: 46.8 % (ref 39.0–52.0)
Hemoglobin: 15.3 g/dL (ref 13.0–17.0)
Immature Granulocytes: 0 %
Lymphocytes Relative: 7 %
Lymphs Abs: 0.5 10*3/uL — ABNORMAL LOW (ref 0.7–4.0)
MCH: 33.5 pg (ref 26.0–34.0)
MCHC: 32.7 g/dL (ref 30.0–36.0)
MCV: 102.4 fL — ABNORMAL HIGH (ref 80.0–100.0)
Monocytes Absolute: 0.9 10*3/uL (ref 0.1–1.0)
Monocytes Relative: 12 %
Neutro Abs: 6 10*3/uL (ref 1.7–7.7)
Neutrophils Relative %: 80 %
Platelets: 238 10*3/uL (ref 150–400)
RBC: 4.57 MIL/uL (ref 4.22–5.81)
RDW: 13.5 % (ref 11.5–15.5)
WBC: 7.6 10*3/uL (ref 4.0–10.5)
nRBC: 0 % (ref 0.0–0.2)

## 2018-12-24 LAB — URINALYSIS, ROUTINE W REFLEX MICROSCOPIC
Bilirubin Urine: NEGATIVE
Glucose, UA: NEGATIVE mg/dL
Ketones, ur: NEGATIVE mg/dL
Nitrite: NEGATIVE
Protein, ur: 100 mg/dL — AB
RBC / HPF: >50 RBC/hpf — ABNORMAL HIGH (ref 0–5)
Specific Gravity, Urine: 1.011 (ref 1.005–1.030)
WBC, UA: >50 WBC/hpf — ABNORMAL HIGH (ref 0–5)
pH: 5 (ref 5.0–8.0)

## 2018-12-24 LAB — COMPREHENSIVE METABOLIC PANEL
ALT: 18 U/L (ref 0–44)
AST: 56 U/L — ABNORMAL HIGH (ref 15–41)
Albumin: 2.6 g/dL — ABNORMAL LOW (ref 3.5–5.0)
Alkaline Phosphatase: 235 U/L — ABNORMAL HIGH (ref 38–126)
Anion gap: 14 (ref 5–15)
BUN: 42 mg/dL — ABNORMAL HIGH (ref 8–23)
CO2: 31 mmol/L (ref 22–32)
Calcium: 10.3 mg/dL (ref 8.9–10.3)
Chloride: 94 mmol/L — ABNORMAL LOW (ref 98–111)
Creatinine, Ser: 1.6 mg/dL — ABNORMAL HIGH (ref 0.61–1.24)
GFR calc Af Amer: 49 mL/min — ABNORMAL LOW (ref >60)
GFR calc non Af Amer: 42 mL/min — ABNORMAL LOW (ref >60)
Glucose, Bld: 132 mg/dL — ABNORMAL HIGH (ref 70–99)
Potassium: 3.7 mmol/L (ref 3.5–5.1)
Sodium: 139 mmol/L (ref 135–145)
Total Bilirubin: 2.9 mg/dL — ABNORMAL HIGH (ref 0.3–1.2)
Total Protein: 7.4 g/dL (ref 6.5–8.1)

## 2018-12-24 MED ORDER — CEPHALEXIN 500 MG PO CAPS: 500.0000 mg | ORAL_CAPSULE | Freq: Four times a day (QID) | ORAL | 0 refills | Status: DC

## 2018-12-24 MED ORDER — SODIUM CHLORIDE 0.9 % IV SOLN
1.0000 g | Freq: Once | INTRAVENOUS | Status: AC
  Administered 2018-12-24: 18:00:00 1 g via INTRAVENOUS
  Filled 2018-12-24: qty 10

## 2018-12-24 MED ORDER — SODIUM CHLORIDE 0.9 % IV BOLUS
1000.0000 mL | Freq: Once | INTRAVENOUS | Status: AC
  Administered 2018-12-24: 1000 mL via INTRAVENOUS

## 2018-12-24 NOTE — ED Notes (Signed)
Bed: WA02 Expected date:  Expected time:  Means of arrival:  Comments: 73 yo L-knee pain, fall

## 2018-12-24 NOTE — ED Notes (Signed)
PTAR called  

## 2018-12-24 NOTE — ED Triage Notes (Signed)
Patient presents via EMS with hematuria seen in his foley catheter. He has not had a bowel movement in 2 -3 days and he is on opoid pain medication. The patient complains of bilateral leg pain. He has a wound on the left foot which had maggots in it until recently. EMS noted positive orthostatic vitals and lethargy.      EMS vitals: 107/75 BP 80 HR  30 Resp Rate 98% O2 sat on room air 97.5 Temp

## 2018-12-24 NOTE — ED Notes (Signed)
RN attempted to update family of patient discharge, but could not get through.

## 2018-12-24 NOTE — Discharge Instructions (Addendum)
Follow-up with your family doctor later this week for recheck

## 2018-12-24 NOTE — ED Notes (Signed)
Patient left with PTAR prior to Covid swabbing.
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
## 2018-12-26 LAB — URINE CULTURE: Culture: >=100000 — AB

## 2018-12-27 ENCOUNTER — Telehealth: Payer: Self-pay | Admitting: *Deleted

## 2018-12-27 NOTE — Progress Notes (Signed)
ED Antimicrobial Stewardship Positive Culture Follow Up   Stanley [name] is an 73 y.o. male who presented to [facility] on 12/24/2018 with a chief complaint of  Chief Complaint  Patient presents with  . Leg Pain  . Hematuria  . foot wound  . Fatigue    Recent Results (from the past 720 hour(s))  Surgical pcr screen     Status: Abnormal   Collection Time: 12/18/18  2:03 PM   Specimen: Nasal Mucosa; Nasal Swab  Result Value Ref Range Status   MRSA, PCR NEGATIVE NEGATIVE Final   Staphylococcus aureus POSITIVE (A) NEGATIVE Final    Comment: (NOTE) The Xpert SA Assay (FDA approved for NASAL specimens in patients 71 years of age and older), is one component of a comprehensive surveillance program. It is not intended to diagnose infection nor to guide or monitor treatment. Performed at [facility], [place] [address], [place], [place] [address]   SARS Coronavirus 2 (Performed in [facility] hospital lab)     Status: None   Collection Time: 12/18/18  2:53 PM   Specimen: Nasal Swab  Result Value Ref Range Status   SARS Coronavirus 2 NEGATIVE NEGATIVE Final    Comment: (NOTE) SARS-CoV-2 target nucleic acids are NOT DETECTED. The SARS-CoV-2 RNA is generally detectable in upper and lower respiratory specimens during the acute phase of infection. Negative results do not preclude SARS-CoV-2 infection, do not rule out co-infections with other pathogens, and should not be used as the sole basis for treatment or other patient management decisions. Negative results must be combined with clinical observations, patient history, and epidemiological information. The expected result is Negative. Fact Sheet for Patients: TrashEliminator.se Fact Sheet for Healthcare Providers: WhoisBlogging.ch This test is not yet approved or cleared by the [place] FDA and  has been authorized for detection and/or diagnosis of SARS-CoV-2 by FDA under an  Emergency Use Authorization (EUA). This EUA will remain  in effect (meaning this test can be used) for the duration of the COVID-19 declaration under Section 56 4(b)(1) of the Act, 21 U.S.C. section 360bbb-3(b)(1), unless the authorization is terminated or revoked sooner. Performed at [facility], [place] [address], [place], [place] [address]   Urine culture     Status: Abnormal   Collection Time: 12/24/18  4:42 PM   Specimen: Urine, Random  Result Value Ref Range Status   Specimen Description   Final    URINE, RANDOM Performed at [place] [address] [place],  [address]    Special Requests   Final    NONE Performed at [facility], [place] [address] [place], [place] [address]    Culture >=100,000 COLONIES/mL PSEUDOMONAS AERUGINOSA (A)  Final   Report Status 12/26/2018 FINAL  Final   Organism ID, Bacteria PSEUDOMONAS AERUGINOSA (A)  Final      Susceptibility   Pseudomonas aeruginosa - MIC*    CEFTAZIDIME 16 INTERMEDIATE Intermediate     CIPROFLOXACIN <=0.25 SENSITIVE Sensitive     GENTAMICIN 2 SENSITIVE Sensitive     IMIPENEM 1 SENSITIVE Sensitive     PIP/TAZO 32 SENSITIVE Sensitive     CEFEPIME INTERMEDIATE Intermediate     * >=100,000 COLONIES/mL PSEUDOMONAS AERUGINOSA    [x]  Treated with cephalexin, organism resistant to prescribed antimicrobial  New antibiotic prescription: fosfomycin 3 g PO once daily every 3rd day for a total of 2 doses. No refills.   When contacting patient, please instruct him to call back  if unable to obtain medication or if it is too expensive.   ED Provider: Claude MangesJohana Soto, PA-C  Cindi CarbonMary M Jadira Nierman, PharmD 12/27/2018, 12:05 PM Clinical Pharmacist [phone]

## 2018-12-27 NOTE — Telephone Encounter (Signed)
Post ED Visit - Positive Culture Follow-up: Successful Patient Follow-Up  Culture assessed and recommendations reviewed by:  []  Elenor Quinones, Pharm.D. []  Heide Guile, Pharm.D., BCPS AQ-ID []  Parks Neptune, Pharm.D., BCPS []  Alycia Rossetti, Pharm.D., BCPS []  [place], [place].D., BCPS, AAHIVP []  Legrand Como, Pharm.D., BCPS, AAHIVP []  Salome Arnt, PharmD, BCPS []  Johnnette Gourd, PharmD, BCPS []  Hughes Better, PharmD, BCPS []  Leeroy Cha, PharmD Rebekah [place], Pharm D  Positive urine culture  []  Patient discharged without antimicrobial prescription and treatment is now indicated [x]  Organism is resistant to prescribed ED discharge antimicrobial []  Patient with positive blood cultures  Changes discussed with ED provider Janeece Fitting, PA-C New antibiotic prescription Fosfomycin 3g po 1 dose every 3rd day x 2 doses. Called to [place], [place]  Contacted patient, date 12/27/2018, time [place], [place] 12/27/2018, 12:30 PM

## 2019-01-01 ENCOUNTER — Other Ambulatory Visit: Payer: Self-pay

## 2019-01-01 NOTE — Patient Outreach (Signed)
Telephone outreach to patient to obtain mRs was successfully completed. mRs= 5. 

## 2019-01-02 ENCOUNTER — Other Ambulatory Visit: Payer: Self-pay | Admitting: Cardiothoracic Surgery

## 2019-01-02 DIAGNOSIS — I509 Heart failure, unspecified: Secondary | ICD-10-CM

## 2019-01-03 ENCOUNTER — Other Ambulatory Visit: Payer: Self-pay

## 2019-01-03 ENCOUNTER — Encounter: Payer: Self-pay | Admitting: Cardiothoracic Surgery

## 2019-01-03 ENCOUNTER — Ambulatory Visit: Admitting: Cardiothoracic Surgery

## 2019-01-03 ENCOUNTER — Ambulatory Visit
Admission: RE | Admit: 2019-01-03 | Discharge: 2019-01-03 | Disposition: A | Discharge status: Home / Self Care | Payer: Medicare Other | Source: Ambulatory Visit | Attending: Cardiothoracic Surgery | Admitting: Cardiothoracic Surgery

## 2019-01-03 VITALS — BP 67/36 | HR 61 | Temp 97.3°F | Resp 16 | Ht 66.0 in

## 2019-01-03 DIAGNOSIS — I5043 Acute on chronic combined systolic (congestive) and diastolic (congestive) heart failure: Secondary | ICD-10-CM

## 2019-01-03 DIAGNOSIS — I509 Heart failure, unspecified: Secondary | ICD-10-CM

## 2019-01-03 DIAGNOSIS — J90 Pleural effusion, not elsewhere classified: Secondary | ICD-10-CM | POA: Diagnosis not present
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
## 2019-01-07 ENCOUNTER — Telehealth: Payer: Self-pay

## 2019-01-07 ENCOUNTER — Telehealth: Payer: Self-pay | Admitting: *Deleted

## 2019-01-07 DIAGNOSIS — Z006 Encounter for examination for normal comparison and control in clinical research program: Secondary | ICD-10-CM

## 2019-01-07 NOTE — Telephone Encounter (Deleted)
Patient stated he uses all IP and threw IP box in trash.

## 2019-01-07 NOTE — Telephone Encounter (Signed)
Patient's son, Thurmond Butts contacted the office with questions about his PleurX drainage catheter. Questions answered and advised to give the office a call back with further questions.  He acknowledged receipt.

## 2019-01-07 NOTE — Telephone Encounter (Addendum)
Spoke with patient via phone. He does not wish to restart IP, but will remain in the study off IP. Patient stated he used all IP and disposed of the box and used syrings.

## 2019-01-14 ENCOUNTER — Encounter (HOSPITAL_COMMUNITY): Payer: Self-pay | Admitting: Internal Medicine

## 2019-01-14 ENCOUNTER — Other Ambulatory Visit: Payer: Self-pay

## 2019-01-14 ENCOUNTER — Ambulatory Visit (HOSPITAL_COMMUNITY)
Admission: RE | Admit: 2019-01-14 | Discharge: 2019-01-14 | Disposition: A | Discharge status: Home / Self Care | Source: Ambulatory Visit | Attending: Internal Medicine | Admitting: Internal Medicine

## 2019-01-14 VITALS — BP 94/63 | HR 57 | Wt 124.2 lb

## 2019-01-14 DIAGNOSIS — I130 Hypertensive heart and chronic kidney disease with heart failure and stage 1 through stage 4 chronic kidney disease, or unspecified chronic kidney disease: Secondary | ICD-10-CM | POA: Insufficient documentation

## 2019-01-14 DIAGNOSIS — Z7902 Long term (current) use of antithrombotics/antiplatelets: Secondary | ICD-10-CM | POA: Insufficient documentation

## 2019-01-14 DIAGNOSIS — R7303 Prediabetes: Secondary | ICD-10-CM | POA: Diagnosis not present

## 2019-01-14 DIAGNOSIS — Z8673 Personal history of transient ischemic attack (TIA), and cerebral infarction without residual deficits: Secondary | ICD-10-CM | POA: Diagnosis not present

## 2019-01-14 DIAGNOSIS — Z8249 Family history of ischemic heart disease and other diseases of the circulatory system: Secondary | ICD-10-CM | POA: Insufficient documentation

## 2019-01-14 DIAGNOSIS — Z66 Do not resuscitate: Secondary | ICD-10-CM | POA: Insufficient documentation

## 2019-01-14 DIAGNOSIS — Z79899 Other long term (current) drug therapy: Secondary | ICD-10-CM | POA: Insufficient documentation

## 2019-01-14 DIAGNOSIS — N189 Chronic kidney disease, unspecified: Secondary | ICD-10-CM | POA: Diagnosis not present

## 2019-01-14 DIAGNOSIS — I5042 Chronic combined systolic (congestive) and diastolic (congestive) heart failure: Secondary | ICD-10-CM

## 2019-01-14 DIAGNOSIS — H409 Unspecified glaucoma: Secondary | ICD-10-CM | POA: Diagnosis not present

## 2019-01-14 DIAGNOSIS — Z955 Presence of coronary angioplasty implant and graft: Secondary | ICD-10-CM | POA: Diagnosis not present

## 2019-01-14 DIAGNOSIS — I739 Peripheral vascular disease, unspecified: Secondary | ICD-10-CM | POA: Insufficient documentation

## 2019-01-14 DIAGNOSIS — E854 Organ-limited amyloidosis: Secondary | ICD-10-CM

## 2019-01-14 DIAGNOSIS — I43 Cardiomyopathy in diseases classified elsewhere: Secondary | ICD-10-CM

## 2019-01-14 DIAGNOSIS — J90 Pleural effusion, not elsewhere classified: Secondary | ICD-10-CM | POA: Diagnosis not present

## 2019-01-14 DIAGNOSIS — I2510 Atherosclerotic heart disease of native coronary artery without angina pectoris: Secondary | ICD-10-CM | POA: Diagnosis not present

## 2019-01-14 DIAGNOSIS — Z888 Allergy status to other drugs, medicaments and biological substances status: Secondary | ICD-10-CM | POA: Insufficient documentation

## 2019-01-14 DIAGNOSIS — I5023 Acute on chronic systolic (congestive) heart failure: Secondary | ICD-10-CM | POA: Diagnosis present

## 2019-01-14 DIAGNOSIS — N179 Acute kidney failure, unspecified: Secondary | ICD-10-CM | POA: Diagnosis not present

## 2019-01-14 LAB — CBC
HCT: 42.1 % (ref 39.0–52.0)
Hemoglobin: 13.8 g/dL (ref 13.0–17.0)
MCH: 32.7 pg (ref 26.0–34.0)
MCHC: 32.8 g/dL (ref 30.0–36.0)
MCV: 99.8 fL (ref 80.0–100.0)
Platelets: 313 10*3/uL (ref 150–400)
RBC: 4.22 MIL/uL (ref 4.22–5.81)
RDW: 13.6 % (ref 11.5–15.5)
WBC: 4.4 10*3/uL (ref 4.0–10.5)
nRBC: 0 % (ref 0.0–0.2)

## 2019-01-14 LAB — BASIC METABOLIC PANEL
Anion gap: 8 (ref 5–15)
BUN: 32 mg/dL — ABNORMAL HIGH (ref 8–23)
CO2: 31 mmol/L (ref 22–32)
Calcium: 10.4 mg/dL — ABNORMAL HIGH (ref 8.9–10.3)
Chloride: 97 mmol/L — ABNORMAL LOW (ref 98–111)
Creatinine, Ser: 1.47 mg/dL — ABNORMAL HIGH (ref 0.61–1.24)
GFR calc Af Amer: 54 mL/min — ABNORMAL LOW (ref >60)
GFR calc non Af Amer: 47 mL/min — ABNORMAL LOW (ref >60)
Glucose, Bld: 98 mg/dL (ref 70–99)
Potassium: 4.3 mmol/L (ref 3.5–5.1)
Sodium: 136 mmol/L (ref 135–145)

## 2019-01-14 NOTE — Patient Instructions (Signed)
Lab work done. We will notify you of any abnormal lab work. No news is good news.  You have been referred to Dr. Lattie Corns for further genetic evaluation. That office will be in contact with you.  Please follow up with the [facility] in 3 months.  At the [facility], you and your health needs are our priority. As part of our continuing mission to provide you with exceptional heart care, we have created designated Provider Care Teams. These Care Teams include your primary Cardiologist (physician) and Advanced Practice Providers (APPs- Physician Assistants and Nurse Practitioners) who all work together to provide you with the care you need, when you need it.   You may see any of the following providers on your designated Care Team at your next follow up: [name] Dr Glori Bickers . Dr Loralie Champagne . Darrick Grinder, NP
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
## 2019-01-22 ENCOUNTER — Encounter (HOSPITAL_COMMUNITY): Payer: Self-pay

## 2019-01-22 ENCOUNTER — Other Ambulatory Visit: Payer: Self-pay | Admitting: Cardiothoracic Surgery

## 2019-01-22 DIAGNOSIS — I25119 Atherosclerotic heart disease of native coronary artery with unspecified angina pectoris: Secondary | ICD-10-CM

## 2019-01-23 ENCOUNTER — Other Ambulatory Visit: Payer: Self-pay | Admitting: Cardiothoracic Surgery

## 2019-01-23 ENCOUNTER — Ambulatory Visit
Admission: RE | Admit: 2019-01-23 | Discharge: 2019-01-23 | Disposition: A | Discharge status: Home / Self Care | Payer: Medicare Other | Source: Ambulatory Visit | Attending: Cardiothoracic Surgery | Admitting: Cardiothoracic Surgery

## 2019-01-23 ENCOUNTER — Encounter: Payer: Self-pay | Admitting: Cardiothoracic Surgery

## 2019-01-23 ENCOUNTER — Telehealth (HOSPITAL_COMMUNITY): Payer: Self-pay

## 2019-01-23 ENCOUNTER — Ambulatory Visit (INDEPENDENT_AMBULATORY_CARE_PROVIDER_SITE_OTHER): Admitting: Cardiothoracic Surgery

## 2019-01-23 ENCOUNTER — Encounter (HOSPITAL_COMMUNITY): Payer: Self-pay

## 2019-01-23 ENCOUNTER — Other Ambulatory Visit: Payer: Self-pay

## 2019-01-23 VITALS — BP 103/64 | HR 61 | Temp 96.8°F | Resp 20 | Ht 66.0 in

## 2019-01-23 DIAGNOSIS — I509 Heart failure, unspecified: Secondary | ICD-10-CM

## 2019-01-23 DIAGNOSIS — J90 Pleural effusion, not elsewhere classified: Secondary | ICD-10-CM | POA: Diagnosis not present

## 2019-01-23 DIAGNOSIS — I25119 Atherosclerotic heart disease of native coronary artery with unspecified angina pectoris: Secondary | ICD-10-CM

## 2019-01-23 NOTE — Telephone Encounter (Signed)
Wrote letter for the [employer] to state that our pt is high risk for covid 19. Letter signed by Bensimhon and sent via mail to pt/pt son.
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
## 2019-02-26 ENCOUNTER — Other Ambulatory Visit: Payer: Self-pay | Admitting: Cardiothoracic Surgery

## 2019-02-26 DIAGNOSIS — I509 Heart failure, unspecified: Secondary | ICD-10-CM

## 2019-02-26 DEATH — deceased

## 2019-02-27 ENCOUNTER — Encounter: Payer: Medicare Other | Admitting: Cardiothoracic Surgery

## 2019-02-27 NOTE — Progress Notes (Signed)
This encounter was created in error - please disregard.

## 2019-03-03 ENCOUNTER — Other Ambulatory Visit (HOSPITAL_COMMUNITY): Payer: Self-pay | Admitting: Internal Medicine

## 2019-03-07 ENCOUNTER — Encounter: Payer: Medicare Other | Admitting: Genetic Counselor

## 2019-04-17 ENCOUNTER — Encounter (HOSPITAL_COMMUNITY): Payer: Medicare Other | Admitting: Internal Medicine
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
